# Patient Record
Sex: Female | Born: 1990
Health system: Southern US, Community
[De-identification: ages and names within clinical notes are randomized; demographics above are authoritative.]

## PROBLEM LIST (undated history)

## (undated) ENCOUNTER — Inpatient Hospital Stay: Payer: Self-pay

## (undated) DIAGNOSIS — Z3A33 33 weeks gestation of pregnancy: Secondary | ICD-10-CM

## (undated) DIAGNOSIS — F419 Anxiety disorder, unspecified: Secondary | ICD-10-CM

## (undated) DIAGNOSIS — I471 Supraventricular tachycardia, unspecified: Secondary | ICD-10-CM

## (undated) DIAGNOSIS — E282 Polycystic ovarian syndrome: Secondary | ICD-10-CM

## (undated) HISTORY — DX: Anxiety disorder, unspecified: F41.9

## (undated) HISTORY — PX: NO PAST SURGERIES: SHX2092

## (undated) HISTORY — DX: Supraventricular tachycardia, unspecified: I47.10

## (undated) HISTORY — DX: 33 weeks gestation of pregnancy: Z3A.33

## (undated) HISTORY — DX: Supraventricular tachycardia: I47.1

---

## 2014-05-24 ENCOUNTER — Other Ambulatory Visit
Admit: 2014-05-24 | Disposition: A | Discharge status: Home / Self Care | Payer: Self-pay | Attending: Obstetrics & Gynecology | Admitting: Obstetrics & Gynecology

## 2014-05-24 LAB — TSH: Thyroid Stimulating Horm: 2.409 u[IU]/mL

## 2015-02-04 NOTE — L&D Delivery Note (Signed)
Delivery Note At 6:10 AM a viable female was delivered via Vaginal, Spontaneous Delivery (Presentation: Right Occiput Anterior).  APGAR: 7,9 ; weight 7 lb 5.5 oz (3330 g).   Placenta status: Intact, Spontaneous.  Cord: 3 vessels with the following complications: none apparent .  Cord pH: not sent  Anesthesia: Epidural  Episiotomy: None Lacerations: 1st degree Suture Repair: 3.0 vicryl Est. Blood Loss (mL): 200  Mom to postpartum.  Baby to Couplet care / Skin to Skin.  Mom presented in labor.  AROM'd for clear fluid and pitocin started for augmentation.  Patient received epidural, progressed to complete, pushed for ~20 minutes, with terminal bradycardia (60s) while baby crowning. Upon delivery, peds assessed baby at perineum, cord was doubly clamped and cut by FOB, and baby was taken to the warmer, and became vigorous there prior to one minute.  A section of cord was obtained for blood gas, but peds declined study.  There was a laceration on the fetal scalp present upon beginning of pushing - unsure of its etiology.  Reassessed after delivery and still present - hemostatic.  A shallow tear in the posterior fourchette that was bleeding was repaired with 2x figure of eights; no perineal tear.  Baby was recovered and placed on mom's chest for skin to skin, and we sang happy birthday to [facility].   Chelsea C Ward 06/29/2015, 6:41 AM

## 2015-02-15 LAB — OB RESULTS CONSOLE HEPATITIS B SURFACE ANTIGEN: HEP B S AG: NEGATIVE

## 2015-02-15 LAB — OB RESULTS CONSOLE RUBELLA ANTIBODY, IGM: RUBELLA: IMMUNE

## 2015-02-15 LAB — OB RESULTS CONSOLE ABO/RH: RH TYPE: POSITIVE

## 2015-02-15 LAB — OB RESULTS CONSOLE RPR: RPR: NONREACTIVE

## 2015-02-15 LAB — OB RESULTS CONSOLE GC/CHLAMYDIA
Chlamydia: NEGATIVE
Gonorrhea: NEGATIVE

## 2015-02-15 LAB — OB RESULTS CONSOLE VARICELLA ZOSTER ANTIBODY, IGG: Varicella: NON-IMMUNE/NOT IMMUNE

## 2015-02-15 LAB — OB RESULTS CONSOLE HIV ANTIBODY (ROUTINE TESTING): HIV: NONREACTIVE

## 2015-02-15 LAB — OB RESULTS CONSOLE PLATELET COUNT: Platelets: 268 10*3/uL

## 2015-04-09 ENCOUNTER — Ambulatory Visit
Admission: RE | Admit: 2015-04-09 | Discharge: 2015-04-09 | Disposition: A | Discharge status: Home / Self Care | Payer: Managed Care, Other (non HMO) | Source: Ambulatory Visit | Attending: Maternal & Fetal Medicine | Admitting: Maternal & Fetal Medicine

## 2015-04-09 VITALS — BP 115/67 | HR 107 | Temp 98.1°F | Resp 18 | Ht 67.0 in | Wt 149.0 lb

## 2015-04-09 DIAGNOSIS — E282 Polycystic ovarian syndrome: Secondary | ICD-10-CM | POA: Diagnosis not present

## 2015-04-09 HISTORY — DX: Polycystic ovarian syndrome: E28.2

## 2015-04-09 NOTE — Progress Notes (Signed)
MFM Consult Note  Reason for consultation: hyperandrogenism in setting of PCOS  25 yo G1 wth LMP 10/05/14 and EDD 07/12/15 c/w [place] at [place] on 02/12/15; measurements were consistent with 9 weeks 6 days.  She is at 26 weeks 5 days and referred secondary to history of PCOS and elevated testosterone levels.  She reports testosterone level testing due to lack of menses x 3 months.  She responded to a progesterone challenge with withdrawal bleeding.  She reports a history of menstrual irregularities since menarche (age 12) and was on OCPS until recently.  She is not overweight and occasionally tweezes facial hairs and reports a history of acne.   Her elevated testosterone levels prompted an endocrinology referral (Dr. Lavone Orn, [place]) and evaluation.  At the time of that consultation (11/2014) she was noted to have normal TSH, hi LH: FSH ratio, normal DHEA-S, nl prolactin and elevated androstenedione (482ng/dl).  Her testosterone level was 127 ng/dL (range 10-55).   No ovarian masses were seen at the time of her first trimester ultrasound.  She is scheduled to have a follow up Endocrinology appointment.    Past Medical History  Diagnosis Date  . PCOS (polycystic ovarian syndrome)    Obgyn Menses onset: age 14, she took OCPs to correct menstrual irregularities.   Her last pap(during pregnancy)  demonstrated ASCUS (pt aware she will need repeat in 1 year)   Past Surgical History  Procedure Laterality Date  . No past surgeries     No Known Allergies  No current outpatient prescriptions on file prior to encounter.   No current facility-administered medications on file prior to encounter.   Family history Niece born with absent phalange, no other family history of birth defects, pgm T2 DM, Sister T2 DM  Social History Here with husband [name]) she denies tobacco, etoh, ilicit drug use.  Homemaker.  [name] smokes 1/2 ppd but plans to quit after baby's born.  He works at [employer]    Labs  (1/17) Varicella nonimmune (pt aware) HIV neg RPR NR Hepatitis B Neg Blood type A positive Hct 32% plt 268K Gc/chlam neg  AFP Tetra T21 risk: 1 in 10,000 T18 risk:  Not increased (1 in 69 by age) MsAFP 1.24 MoM  Impression/Plan We addressed concerns related to PCOs during pregnancy including the increased risk for gestational diabetes, preeclampsia and fetal growth disturbances (elevated testosterone levels have been associated with IUGR)  We also addressed the fact that her levels of testosterone are elevated, but in the context of PCOs, not associated with and increase in fetal virilization.   I recommended the following: 1. Baseline labs due to her increased risk for preeclampsia (CBC---normal), liver function testing, p:c ratio 2. Follow up fetal growth assessment in the third trimester--please schedule where convenient 3. She is scheduled for her glucose screening next week---if she develops gestational diabetes, we would be happy to see her back in consultation.  I advised her that restarting metformin is a possible option if gestational diabetes develops and she needs therapy.    Time spent in consultation 45 minutes

## 2015-05-29 ENCOUNTER — Observation Stay
Admission: EM | Admit: 2015-05-29 | Discharge: 2015-05-29 | Disposition: A | Discharge status: Home / Self Care | Payer: Managed Care, Other (non HMO) | Attending: Obstetrics and Gynecology | Admitting: Obstetrics and Gynecology

## 2015-05-29 DIAGNOSIS — Z3A34 34 weeks gestation of pregnancy: Secondary | ICD-10-CM | POA: Insufficient documentation

## 2015-05-29 DIAGNOSIS — O479 False labor, unspecified: Secondary | ICD-10-CM | POA: Diagnosis present

## 2015-05-29 DIAGNOSIS — O4703 False labor before 37 completed weeks of gestation, third trimester: Secondary | ICD-10-CM | POA: Diagnosis present

## 2015-05-29 MED ORDER — CALCIUM CARBONATE ANTACID 500 MG PO CHEW: 2.0000 | CHEWABLE_TABLET | ORAL | Status: DC | PRN

## 2015-05-29 NOTE — Final Progress Note (Signed)
Physician Final Progress Note  Patient ID: Valerie Gardner MRN: [id number] DOB/AGE: Jun 29, 1990 25 y.o.  Admit date: 05/29/2015 Admitting provider: Malachy Mood, MD Discharge date: 05/29/2015   Admission Diagnoses: Irregular contractions  Discharge Diagnoses:  Active Problems:   Irregular contractions Braxton Hicks contractions  Consults: None  Significant Findings/ Diagnostic Studies: none  Procedures: NST reactive FHT 150, moderate, positive accels, no decels  Discharge Condition: good  Disposition: Final discharge disposition not confirmed  Diet: Regular diet  Discharge Activity: Activity as tolerated  Discharge Instructions    Discharge diet:  No restrictions    Complete by:  As directed      Fetal Kick Count:  Lie on our left side for one hour after a meal, and count the number of times your baby kicks.  If it is less than 5 times, get up, move around and drink some juice.  Repeat the test 30 minutes later.  If it is still less than 5 kicks in an hour, notify your doctor.    Complete by:  As directed      LABOR:  When conractions begin, you should start to time them from the beginning of one contraction to the beginning  of the next.  When contractions are 5 - 10 minutes apart or less and have been regular for at least an hour, you should call your health care provider.    Complete by:  As directed      No sexual activity restrictions    Complete by:  As directed      Notify physician for bleeding from the vagina    Complete by:  As directed      Notify physician for blurring of vision or spots before the eyes    Complete by:  As directed      Notify physician for chills or fever    Complete by:  As directed      Notify physician for fainting spells, "black outs" or loss of consciousness    Complete by:  As directed      Notify physician for increase in vaginal discharge    Complete by:  As directed      Notify physician for leaking of fluid    Complete  by:  As directed      Notify physician for pain or burning when urinating    Complete by:  As directed      Notify physician for pelvic pressure (sudden increase)    Complete by:  As directed      Notify physician for severe or continued nausea or vomiting    Complete by:  As directed      Notify physician for sudden gushing of fluid from the vagina (with or without continued leaking)    Complete by:  As directed      Notify physician for sudden, constant, or occasional abdominal pain    Complete by:  As directed      Notify physician if baby moving less than usual    Complete by:  As directed             Medication List    Notice    You have not been prescribed any medications.       Total time spent taking care of this patient: 15 minutes  Signed: Dorthula Nettles 05/29/2015, 11:21 PM

## 2015-05-29 NOTE — Plan of Care (Signed)
Pt presents to l/d with c/o contractions that started approximately 1 hour ago. Pt's mom states she has had 3 in the last hour.

## 2015-06-21 LAB — OB RESULTS CONSOLE GBS: GBS: NEGATIVE

## 2015-06-28 ENCOUNTER — Inpatient Hospital Stay: Payer: Managed Care, Other (non HMO) | Admitting: Anesthesiology

## 2015-06-28 ENCOUNTER — Encounter: Payer: Self-pay | Admitting: *Deleted

## 2015-06-28 ENCOUNTER — Inpatient Hospital Stay
Admission: EM | Admit: 2015-06-28 | Discharge: 2015-07-01 | DRG: 775 | Disposition: A | Discharge status: Home / Self Care | Payer: Managed Care, Other (non HMO) | Attending: Obstetrics & Gynecology | Admitting: Obstetrics & Gynecology

## 2015-06-28 DIAGNOSIS — O99013 Anemia complicating pregnancy, third trimester: Secondary | ICD-10-CM | POA: Diagnosis present

## 2015-06-28 DIAGNOSIS — Z87891 Personal history of nicotine dependence: Secondary | ICD-10-CM

## 2015-06-28 DIAGNOSIS — D62 Acute posthemorrhagic anemia: Secondary | ICD-10-CM | POA: Diagnosis not present

## 2015-06-28 DIAGNOSIS — Z3A38 38 weeks gestation of pregnancy: Secondary | ICD-10-CM | POA: Diagnosis not present

## 2015-06-28 DIAGNOSIS — Z3403 Encounter for supervision of normal first pregnancy, third trimester: Secondary | ICD-10-CM | POA: Diagnosis present

## 2015-06-28 DIAGNOSIS — E281 Androgen excess: Secondary | ICD-10-CM | POA: Diagnosis present

## 2015-06-28 LAB — ABO/RH: ABO/RH(D): A POS

## 2015-06-28 LAB — TYPE AND SCREEN
ABO/RH(D): A POS
ANTIBODY SCREEN: NEGATIVE

## 2015-06-28 LAB — CBC
HEMATOCRIT: 29.3 % — AB (ref 35.0–47.0)
Hemoglobin: 9.5 g/dL — ABNORMAL LOW (ref 12.0–16.0)
MCH: 26.1 pg (ref 26.0–34.0)
MCHC: 32.3 g/dL (ref 32.0–36.0)
MCV: 80.9 fL (ref 80.0–100.0)
Platelets: 151 10*3/uL (ref 150–440)
RBC: 3.63 MIL/uL — ABNORMAL LOW (ref 3.80–5.20)
RDW: 17.1 % — AB (ref 11.5–14.5)
WBC: 10.1 10*3/uL (ref 3.6–11.0)

## 2015-06-28 MED ORDER — OXYTOCIN 10 UNIT/ML IJ SOLN
INTRAMUSCULAR | Status: AC
  Filled 2015-06-28: qty 2

## 2015-06-28 MED ORDER — LACTATED RINGERS IV SOLN: 500.0000 mL | INTRAVENOUS | Status: DC | PRN

## 2015-06-28 MED ORDER — ACETAMINOPHEN 500 MG PO TABS: 1000.0000 mg | ORAL_TABLET | ORAL | Status: DC | PRN

## 2015-06-28 MED ORDER — LIDOCAINE-EPINEPHRINE (PF) 1.5 %-1:200000 IJ SOLN
INTRAMUSCULAR | Status: DC | PRN
  Administered 2015-06-28: 3 mL via PERINEURAL

## 2015-06-28 MED ORDER — AMMONIA AROMATIC IN INHA
RESPIRATORY_TRACT | Status: AC
  Filled 2015-06-28: qty 10

## 2015-06-28 MED ORDER — FENTANYL 2.5 MCG/ML W/ROPIVACAINE 0.2% IN NS 100 ML EPIDURAL INFUSION (ARMC-ANES)
EPIDURAL | Status: AC
  Filled 2015-06-28: qty 100

## 2015-06-28 MED ORDER — BUTORPHANOL TARTRATE 1 MG/ML IJ SOLN
INTRAMUSCULAR | Status: AC
  Administered 2015-06-28: 1 mg via INTRAVENOUS
  Filled 2015-06-28: qty 1

## 2015-06-28 MED ORDER — LACTATED RINGERS IV SOLN
INTRAVENOUS | Status: DC
  Administered 2015-06-28 (×2): via INTRAVENOUS

## 2015-06-28 MED ORDER — SOD CITRATE-CITRIC ACID 500-334 MG/5ML PO SOLN: 30.0000 mL | ORAL | Status: DC | PRN

## 2015-06-28 MED ORDER — LIDOCAINE HCL (PF) 1 % IJ SOLN
INTRAMUSCULAR | Status: AC
  Filled 2015-06-28: qty 30

## 2015-06-28 MED ORDER — MISOPROSTOL 200 MCG PO TABS
ORAL_TABLET | ORAL | Status: AC
  Filled 2015-06-28: qty 4

## 2015-06-28 MED ORDER — BUTORPHANOL TARTRATE 1 MG/ML IJ SOLN
1.0000 mg | Freq: Once | INTRAMUSCULAR | Status: AC
  Administered 2015-06-28: 1 mg via INTRAVENOUS

## 2015-06-28 MED ORDER — OXYTOCIN BOLUS FROM INFUSION
500.0000 mL | INTRAVENOUS | Status: DC
  Administered 2015-06-29: 500 mL via INTRAVENOUS

## 2015-06-28 MED ORDER — OXYTOCIN 40 UNITS IN LACTATED RINGERS INFUSION - SIMPLE MED
2.5000 [IU]/h | INTRAVENOUS | Status: DC
  Administered 2015-06-29: 2.5 [IU]/h via INTRAVENOUS
  Filled 2015-06-28: qty 1000

## 2015-06-28 MED ORDER — ONDANSETRON HCL 4 MG/2ML IJ SOLN
4.0000 mg | Freq: Four times a day (QID) | INTRAMUSCULAR | Status: DC | PRN
  Administered 2015-06-28 – 2015-06-29 (×2): 4 mg via INTRAVENOUS
  Filled 2015-06-28 (×2): qty 2

## 2015-06-28 MED ORDER — LIDOCAINE HCL (PF) 1 % IJ SOLN: 30.0000 mL | INTRAMUSCULAR | Status: DC | PRN

## 2015-06-28 NOTE — OB Triage Note (Signed)
CTX every 5 mins apart.  Was seen in office today and was 2 cm/ 50%.  Now 3.5cm/80-90%.  No bloody show.

## 2015-06-28 NOTE — Progress Notes (Signed)
Intrapartum progress note  S:  Patient comfortable but contractions getting increasingly painful.  O: 131/71 73 18 98.2   FHT: 135 mod +accels no decels TOCO: q2-31min SVE: 5/90/-1 AROM'd for mod clear fluid  A/P:  24yo G1P0 @ 38.0 with Labor  1. Continue expectant management after AROM, if contractions space out then pitocin PRN 2. IUP: category 1 strip 3. Epidural requested, will notify anesthesia.  ----- Larey Days, MD Attending Obstetrician and Gynecologist [facility]

## 2015-06-28 NOTE — Anesthesia Procedure Notes (Signed)
Epidural Patient location during procedure: OB Start time: 06/28/2015 10:45 PM End time: 06/28/2015 11:09 PM  Staffing Performed by: anesthesiologist   Preanesthetic Checklist Completed: patient identified, site marked, surgical consent, pre-op evaluation, timeout performed, IV checked, risks and benefits discussed and monitors and equipment checked  Epidural Patient position: sitting Prep: Betadine Patient monitoring: heart rate, continuous pulse ox and blood pressure Approach: midline Location: L4-L5 Injection technique: LOR saline  Needle:  Needle type: Tuohy  Needle gauge: 17 G Needle length: 9 cm and 9 Catheter type: closed end flexible Catheter size: 20 Guage Catheter at skin depth: 9 cm Test dose: negative and 1.5% lidocaine with Epi 1:200 K  Assessment Events: blood not aspirated, injection not painful, no injection resistance, negative IV test and no paresthesia  Additional Notes   Patient tolerated the insertion well without complications.Reason for block:procedure for pain

## 2015-06-28 NOTE — Anesthesia Preprocedure Evaluation (Signed)
Anesthesia Evaluation  Patient identified by MRN, date of birth, ID band Patient awake    Reviewed: Allergy & Precautions, NPO status , Patient's Chart, lab work & pertinent test results  History of Anesthesia Complications Negative for: history of anesthetic complications  Airway Mallampati: II       Dental   Pulmonary former smoker,           Cardiovascular negative cardio ROS       Neuro/Psych negative neurological ROS  negative psych ROS   GI/Hepatic negative GI ROS, Neg liver ROS,   Endo/Other  negative endocrine ROS  Renal/GU negative Renal ROS  negative genitourinary   Musculoskeletal   Abdominal   Peds negative pediatric ROS (+)  Hematology negative hematology ROS (+)   Anesthesia Other Findings   Reproductive/Obstetrics (+) Pregnancy                             Anesthesia Physical Anesthesia Plan  ASA: II  Anesthesia Plan: Epidural   Post-op Pain Management:    Induction:   Airway Management Planned:   Additional Equipment:   Intra-op Plan:   Post-operative Plan:   Informed Consent: I have reviewed the patients History and Physical, chart, labs and discussed the procedure including the risks, benefits and alternatives for the proposed anesthesia with the patient or authorized representative who has indicated his/her understanding and acceptance.     Plan Discussed with:   Anesthesia Plan Comments:         Anesthesia Quick Evaluation

## 2015-06-28 NOTE — H&P (Signed)
OB History & Physical   History of Present Illness:  Chief Complaint: contractions  HPI:  Valerie Gardner is a 25 y.o. G1P0 female at [redacted]w[redacted]d dated by LMP c/w 6w [place] with EDC of 07/12/15.  She presents to L&D for persistent contractions that are becoming increasingly painful.  +FM, + CTX, no LOF, no VB  Pregnancy Issues: 1. Hyperandrogenemia;PCOS 2. Anemia  Maternal Medical History:   Past Medical History  Diagnosis Date  . PCOS (polycystic ovarian syndrome)     Past Surgical History  Procedure Laterality Date  . No past surgeries      No Known Allergies  Prior to Admission medications   Not on File     Prenatal care site: [facility]   Social History: She  reports that she has quit smoking. She has never used smokeless tobacco. She reports that she does not drink alcohol or use illicit drugs.  Family History: family history is not on file.   Review of Systems: Negative x 10 systems reviewed except as noted in the HPI.     Physical Exam:  Vital Signs: BP 137/73 mmHg  Pulse 67  Temp(Src) 97.9 F (36.6 C) (Oral)  Resp 16  LMP 10/05/2014 General: no acute distress.  HEENT: normocephalic, atraumatic Heart: regular rate & rhythm.  No murmurs/rubs/gallops Lungs: clear to auscultation bilaterally, normal respiratory effort Abdomen: soft, gravid, non-tender;  EFW: 8.6 Pelvic:   External: Normal external female genitalia  Cervix: Dilation: 3 / Effacement (%): 90 / Station: -2    Extremities: non-tender, symmetric, 1+ edema bilaterally.  DTRs: 2+ Neurologic: Alert & oriented x 3.    No results found for this or any previous visit (from the past 24 hour(s)).  Pertinent Results:  Prenatal Labs: Blood type/Rh A+  Antibody screen neg  Rubella Immune  Varicella Non-Immune  RPR NR  HBsAg Neg  HIV NR  GC neg  Chlamydia neg  Genetic screening declined  1 hour GTT 94  3 hour GTT --  GBS negative   FHT: 135 mod + accels no decels TOCO: q2-34min  SVE:  0000000   Cephalic by leopolds  Assessment:  Valerie Gardner is a 25 y.o. G1P0 female at [redacted]w[redacted]d with labor.   Plan:  1. Admit to Labor & Delivery 2. CBC, T&S, Clrs, IVF 3. GBS  neg 4. Consents obtained. 5. Continuous efm/toco 6. Expectant management; patient may ambulate PRN, epidural PRN.  Will augment PRN.  ----- Larey Days, MD Attending Obstetrician and Gynecologist [facility]

## 2015-06-29 ENCOUNTER — Encounter: Payer: Self-pay | Admitting: Obstetrics & Gynecology

## 2015-06-29 DIAGNOSIS — O99013 Anemia complicating pregnancy, third trimester: Secondary | ICD-10-CM | POA: Diagnosis present

## 2015-06-29 DIAGNOSIS — E281 Androgen excess: Secondary | ICD-10-CM | POA: Diagnosis present

## 2015-06-29 LAB — RPR: RPR: NONREACTIVE

## 2015-06-29 MED ORDER — COCONUT OIL OIL
1.0000 "application " | TOPICAL_OIL | Status: DC | PRN
  Filled 2015-06-29: qty 120

## 2015-06-29 MED ORDER — DIPHENHYDRAMINE HCL 50 MG/ML IJ SOLN: 12.5000 mg | INTRAMUSCULAR | Status: DC | PRN

## 2015-06-29 MED ORDER — DOCUSATE SODIUM 100 MG PO CAPS
100.0000 mg | ORAL_CAPSULE | Freq: Two times a day (BID) | ORAL | Status: DC
  Administered 2015-06-29 – 2015-07-01 (×4): 100 mg via ORAL
  Filled 2015-06-29 (×4): qty 1

## 2015-06-29 MED ORDER — LACTATED RINGERS IV SOLN: 500.0000 mL | Freq: Once | INTRAVENOUS | Status: DC

## 2015-06-29 MED ORDER — BENZOCAINE-MENTHOL 20-0.5 % EX AERO: 1.0000 "application " | INHALATION_SPRAY | CUTANEOUS | Status: DC | PRN

## 2015-06-29 MED ORDER — EPHEDRINE 5 MG/ML INJ
10.0000 mg | INTRAVENOUS | Status: DC | PRN
  Filled 2015-06-29: qty 2

## 2015-06-29 MED ORDER — SIMETHICONE 80 MG PO CHEW: 80.0000 mg | CHEWABLE_TABLET | ORAL | Status: DC | PRN

## 2015-06-29 MED ORDER — ONDANSETRON HCL 4 MG/2ML IJ SOLN: 4.0000 mg | INTRAMUSCULAR | Status: DC | PRN

## 2015-06-29 MED ORDER — PHENYLEPHRINE 40 MCG/ML (10ML) SYRINGE FOR IV PUSH (FOR BLOOD PRESSURE SUPPORT)
80.0000 ug | PREFILLED_SYRINGE | INTRAVENOUS | Status: DC | PRN
  Filled 2015-06-29: qty 5

## 2015-06-29 MED ORDER — WITCH HAZEL-GLYCERIN EX PADS: 1.0000 "application " | MEDICATED_PAD | CUTANEOUS | Status: DC | PRN

## 2015-06-29 MED ORDER — IBUPROFEN 600 MG PO TABS
600.0000 mg | ORAL_TABLET | Freq: Four times a day (QID) | ORAL | Status: DC
  Administered 2015-06-29 – 2015-07-01 (×9): 600 mg via ORAL
  Filled 2015-06-29 (×9): qty 1

## 2015-06-29 MED ORDER — ACETAMINOPHEN 500 MG PO TABS: 1000.0000 mg | ORAL_TABLET | Freq: Four times a day (QID) | ORAL | Status: DC | PRN

## 2015-06-29 MED ORDER — PRENATAL MULTIVITAMIN CH
1.0000 | ORAL_TABLET | Freq: Every day | ORAL | Status: DC
  Administered 2015-06-30 – 2015-07-01 (×2): 1 via ORAL
  Filled 2015-06-29 (×3): qty 1

## 2015-06-29 MED ORDER — DIPHENHYDRAMINE HCL 25 MG PO CAPS: 25.0000 mg | ORAL_CAPSULE | Freq: Four times a day (QID) | ORAL | Status: DC | PRN

## 2015-06-29 MED ORDER — DIBUCAINE 1 % RE OINT: 1.0000 "application " | TOPICAL_OINTMENT | RECTAL | Status: DC | PRN

## 2015-06-29 MED ORDER — OXYTOCIN 40 UNITS IN LACTATED RINGERS INFUSION - SIMPLE MED
1.0000 m[IU]/min | INTRAVENOUS | Status: DC
  Administered 2015-06-29: 1 m[IU]/min via INTRAVENOUS

## 2015-06-29 MED ORDER — FENTANYL 2.5 MCG/ML W/ROPIVACAINE 0.2% IN NS 100 ML EPIDURAL INFUSION (ARMC-ANES): 10.0000 mL/h | EPIDURAL | Status: DC

## 2015-06-29 MED ORDER — ONDANSETRON HCL 4 MG PO TABS: 4.0000 mg | ORAL_TABLET | ORAL | Status: DC | PRN

## 2015-06-29 NOTE — Discharge Summary (Signed)
Obstetrical Discharge Summary  Patient Name: Valerie Gardner DOB: 16-Dec-1990 MRN: [id number]  Date of Admission: 06/28/2015 Date of Discharge: 07/01/15  Primary OB: [facility]   Gestational Age at Delivery: [redacted]w[redacted]d   Antepartum complications: Hyperandrogenemia, Anemia Admitting Diagnosis: Labor  Secondary Diagnosis: Patient Active Problem List   Diagnosis Date Noted  . Labor and delivery, indication for care 06/28/2015  . Irregular contractions 05/29/2015    Augmentation: AROM and Pitocin Complications: None Intrapartum complications/course: Patient admitted in labor, AROM'd for clear fluid, pitocin started, and progressed to complete.  Short second stage.  Delivery of female fetus, terminal bradycardia, not vigorous at birth so cord cut quickly and assessed at warmer by peds with no interventions.  Once at the warmer baby became vigorous.  Shallow first degree laceration, repaired with two figures of eight of 3-0 vicryl.   Date of Delivery: 06/29/15 Delivered By: Vikki Ports Ward Delivery Type: spontaneous vaginal delivery Anesthesia: epidural Placenta: sponatneous Laceration: 1st degree Episiotomy: none Newborn Data: Live born female Birth Weight:   APGAR: 7, 9    Discharge Physical Exam:  Filed Vitals:   06/30/15 1631 06/30/15 2004 07/01/15 0035 07/01/15 0926  BP:  108/50 117/60 117/62  Pulse:  63 74 67  Temp: 98 F (36.7 C) 98 F (36.7 C) 98 F (36.7 C) 98.4 F (36.9 C)  TempSrc: Oral Oral Oral Oral  Resp:  20 20 18   Height:      Weight:      SpO2:  99% 99% 100%    General: NAD CV: RRR Pulm: CTABL, nl effort ABD: s/nd/nt, fundus firm and below the umbilicus Lochia: moderate DVT Evaluation: LE non-ttp, no evidence of DVT on exam.  Recent Results (from the past 2160 hour(s))  OB RESULT CONSOLE Group B Strep     Status: None   Collection Time: 06/21/15 12:00 AM  Result Value Ref Range   GBS Negative   CBC     Status: Abnormal   Collection Time:  06/28/15  5:09 PM  Result Value Ref Range   WBC 10.1 3.6 - 11.0 K/uL   RBC 3.63 (L) 3.80 - 5.20 MIL/uL   Hemoglobin 9.5 (L) 12.0 - 16.0 g/dL   HCT 29.3 (L) 35.0 - 47.0 %   MCV 80.9 80.0 - 100.0 fL   MCH 26.1 26.0 - 34.0 pg   MCHC 32.3 32.0 - 36.0 g/dL   RDW 17.1 (H) 11.5 - 14.5 %   Platelets 151 150 - 440 K/uL  Type and screen [facility]     Status: None   Collection Time: 06/28/15  5:09 PM  Result Value Ref Range   ABO/RH(D) A POS    Antibody Screen NEG    Sample Expiration 07/01/2015   RPR     Status: None   Collection Time: 06/28/15  5:09 PM  Result Value Ref Range   RPR Ser Ql Non Reactive Non Reactive    Comment: (NOTE) Performed At: [facility] [place] [employer], [place] [id number] Lindon Romp MD [id number]   ABO/Rh     Status: None   Collection Time: 06/28/15  5:10 PM  Result Value Ref Range   ABO/RH(D) A POS   CBC     Status: Abnormal   Collection Time: 06/30/15  6:51 AM  Result Value Ref Range   WBC 13.2 (H) 3.6 - 11.0 K/uL   RBC 3.19 (L) 3.80 - 5.20 MIL/uL   Hemoglobin 8.1 (L) 12.0 - 16.0 g/dL  HCT 25.4 (L) 35.0 - 47.0 %   MCV 79.7 (L) 80.0 - 100.0 fL   MCH 25.4 (L) 26.0 - 34.0 pg   MCHC 31.9 (L) 32.0 - 36.0 g/dL   RDW 17.1 (H) 11.5 - 14.5 %   Platelets 142 (L) 150 - 440 K/uL    Post partum course: Unremarkable Postpartum Procedures: None Disposition: stable, discharge to home. Baby Feeding: bottle Baby Disposition: home with mom  Rh Immune globulin given: n/a Rubella vaccine given: n/a Tdap vaccine given in AP or PP setting: AP Flu vaccine given in AP or PP setting: n/a  Contraception: condoms  Prenatal Labs:  Blood type/Rh A+  Antibody screen neg  Rubella Immune  Varicella Non-Immune  RPR NR  HBsAg Neg  HIV NR  GC neg  Chlamydia neg  Genetic screening declined  1 hour GTT 94  3 hour GTT --  GBS negative          Plan:  Valerie Gardner was discharged to  home in good condition. Follow-up appointment at [place] with Dr Leonides Schanz in 6 weeks    Discharge Medications:   Medication List    TAKE these medications        ferrous gluconate 324 MG tablet  Commonly known as:  FERGON  Take 1 tablet (324 mg total) by mouth daily.     ibuprofen 600 MG tablet  Commonly known as:  ADVIL,MOTRIN  Take 1 tablet (600 mg total) by mouth every 6 (six) hours.         Sharlyn Bologna, CNM

## 2015-06-30 LAB — CBC
HCT: 25.4 % — ABNORMAL LOW (ref 35.0–47.0)
Hemoglobin: 8.1 g/dL — ABNORMAL LOW (ref 12.0–16.0)
MCH: 25.4 pg — ABNORMAL LOW (ref 26.0–34.0)
MCHC: 31.9 g/dL — AB (ref 32.0–36.0)
MCV: 79.7 fL — ABNORMAL LOW (ref 80.0–100.0)
Platelets: 142 10*3/uL — ABNORMAL LOW (ref 150–440)
RBC: 3.19 MIL/uL — ABNORMAL LOW (ref 3.80–5.20)
RDW: 17.1 % — AB (ref 11.5–14.5)
WBC: 13.2 10*3/uL — ABNORMAL HIGH (ref 3.6–11.0)

## 2015-06-30 NOTE — Anesthesia Postprocedure Evaluation (Signed)
Anesthesia Post Note  Patient: Valerie Gardner  Procedure(s) Performed: * No procedures listed *  Patient location during evaluation: Mother Baby Anesthesia Type: Epidural Level of consciousness: awake and alert Pain management: pain level controlled Vital Signs Assessment: post-procedure vital signs reviewed and stable Respiratory status: spontaneous breathing, nonlabored ventilation and respiratory function stable Cardiovascular status: stable Postop Assessment: no headache, no backache and epidural receding Anesthetic complications: no    Last Vitals:  Filed Vitals:   06/30/15 0306 06/30/15 0746  BP: 102/45 111/54  Pulse: 65 73  Temp: 36.8 C 36.8 C  Resp: 20 18    Last Pain:  Filed Vitals:   06/30/15 0746  PainSc: 0-No pain                 Precious Haws Kayshawn Ozburn

## 2015-06-30 NOTE — Anesthesia Post-op Follow-up Note (Signed)
  Anesthesia Pain Follow-up Note  Patient: Valerie Gardner  Day #: 1  Date of Follow-up: 06/30/2015 Time: 8:35 AM  Last Vitals:  Filed Vitals:   06/30/15 0306 06/30/15 0746  BP: 102/45 111/54  Pulse: 65 73  Temp: 36.8 C 36.8 C  Resp: 20 18    Level of Consciousness: alert  Pain: mild   Side Effects:None  Catheter Site Exam:clean  Plan: D/C from anesthesia care  [place]

## 2015-06-30 NOTE — Progress Notes (Signed)
  Subjective:  Doing well, moderate lochia.  No fevers, no chills  Objective:   Blood pressure 111/54, pulse 73, temperature 98.2 F (36.8 C), temperature source Oral, resp. rate 18, height 5\' 7"  (1.702 m), weight 75.297 kg (166 lb), last menstrual period 10/05/2014, SpO2 98 %, unknown if currently breastfeeding.  General: NAD Pulmonary: no increased work of breathing Abdomen: non-distended, non-tender, fundus firm at level of umbilicus Extremities: no edema, no erythema, no tenderness  Results for orders placed or performed during the hospital encounter of 06/28/15 (from the past 72 hour(s))  CBC     Status: Abnormal   Collection Time: 06/28/15  5:09 PM  Result Value Ref Range   WBC 10.1 3.6 - 11.0 K/uL   RBC 3.63 (L) 3.80 - 5.20 MIL/uL   Hemoglobin 9.5 (L) 12.0 - 16.0 g/dL   HCT 29.3 (L) 35.0 - 47.0 %   MCV 80.9 80.0 - 100.0 fL   MCH 26.1 26.0 - 34.0 pg   MCHC 32.3 32.0 - 36.0 g/dL   RDW 17.1 (H) 11.5 - 14.5 %   Platelets 151 150 - 440 K/uL  Type and screen [facility]     Status: None   Collection Time: 06/28/15  5:09 PM  Result Value Ref Range   ABO/RH(D) A POS    Antibody Screen NEG    Sample Expiration 07/01/2015   RPR     Status: None   Collection Time: 06/28/15  5:09 PM  Result Value Ref Range   RPR Ser Ql Non Reactive Non Reactive    Comment: (NOTE) Performed At: [facility] [place], [place] [id number] Lindon Romp MD [id number]   ABO/Rh     Status: None   Collection Time: 06/28/15  5:10 PM  Result Value Ref Range   ABO/RH(D) A POS   CBC     Status: Abnormal   Collection Time: 06/30/15  6:51 AM  Result Value Ref Range   WBC 13.2 (H) 3.6 - 11.0 K/uL   RBC 3.19 (L) 3.80 - 5.20 MIL/uL   Hemoglobin 8.1 (L) 12.0 - 16.0 g/dL   HCT 25.4 (L) 35.0 - 47.0 %   MCV 79.7 (L) 80.0 - 100.0 fL   MCH 25.4 (L) 26.0 - 34.0 pg   MCHC 31.9 (L) 32.0 - 36.0 g/dL   RDW 17.1 (H) 11.5 - 14.5 %   Platelets 142 (L) 150 - 440  K/uL    Assessment:   25 y.o. G1P1001 postpartum day # 1 TSVD  Plan:    1) Acute blood loss anemia - hemodynamically stable and asymptomatic - po ferrous sulfate  2) --/--/A POS (05/25 1710) Charlynn Grimes Immune (01/12 0000) / Varicella non-immune - Varivax at discharge  3) TDAP status up to date per patient report  4) Bottle/Condoms  5) Disposition anticipated discharge PPD2

## 2015-07-01 MED ORDER — VARICELLA VIRUS VACCINE LIVE 1350 PFU/0.5ML IJ SUSR
0.5000 mL | INTRAMUSCULAR | Status: AC | PRN
  Administered 2015-07-01: 0.5 mL via SUBCUTANEOUS
  Filled 2015-07-01: qty 0.5

## 2015-07-01 MED ORDER — IBUPROFEN 600 MG PO TABS
600.0000 mg | ORAL_TABLET | Freq: Four times a day (QID) | ORAL | Status: DC
Start: 2015-07-01 — End: 2016-10-02

## 2015-07-01 MED ORDER — FERROUS GLUCONATE 324 (38 FE) MG PO TABS: 324.0000 mg | ORAL_TABLET | Freq: Every day | ORAL | Status: DC

## 2015-07-01 NOTE — Progress Notes (Signed)
Pt states that she received TDaP vaccine during pregnancy.  Pt declines TDaP vaccine at this time. Reed Breech, RN 07/01/2015 1:29 PM

## 2015-07-01 NOTE — Progress Notes (Signed)
Discharge instructions provided. Pt and sig other verbalize understanding of all instructions and follow-up care.  Prescriptions given.  Pt discharged to home with infant at 1640 on 07/01/15 via wheelchair by RN. Reed Breech, RN 07/01/2015 7:12 PM

## 2015-07-01 NOTE — Discharge Instructions (Signed)
Discharge instructions:   Call office if you have any of the following: headache, visual changes, fever >101.0 F, chills, breast concerns, excessive vaginal bleeding, incision drainage or problems, leg pain or redness, depression or any other concerns.   Activity: Do not lift > 10 lbs for 6 weeks.  No intercourse or tampons for 6 weeks.  No driving for 1-2 weeks.   Call your doctor for increased pain or vaginal bleeding, temperature above 101.0, depression, or concerns.  No strenuous activity or heavy lifting for 6 weeks.  No intercourse, tampons, douching, or enemas for 6 weeks.  No tub baths-showers only.  No driving for 2 weeks or while taking pain medications.  Continue prenatal vitamin and iron.  Increase calories and fluids while breastfeeding.  You may have a slight fever when your milk comes in, but it should go away on its own.  If it does not, and rises above 101.0 please call the doctor.  For concerns about your baby, please call your pediatrician For breastfeeding concerns, the lactation consultant can be reached at [phone]  Call your doctor for increased pain or vaginal bleeding, temperature above 100.4, depression, or concerns.  No strenuous activity or heavy lifting for 6 weeks.  No intercourse, tampons, douching, or enemas for 6 weeks.  No tub baths-showers only.  No driving for 2 weeks or while taking pain medications.  Continue prenatal vitamin and iron.

## 2016-10-02 ENCOUNTER — Ambulatory Visit (INDEPENDENT_AMBULATORY_CARE_PROVIDER_SITE_OTHER): Payer: Commercial Managed Care - PPO | Admitting: Advanced Practice Midwife

## 2016-10-02 ENCOUNTER — Encounter: Payer: Self-pay | Admitting: Advanced Practice Midwife

## 2016-10-02 VITALS — BP 110/74 | Wt 130.0 lb

## 2016-10-02 DIAGNOSIS — Z113 Encounter for screening for infections with a predominantly sexual mode of transmission: Secondary | ICD-10-CM

## 2016-10-02 DIAGNOSIS — Z124 Encounter for screening for malignant neoplasm of cervix: Secondary | ICD-10-CM

## 2016-10-02 DIAGNOSIS — Z3490 Encounter for supervision of normal pregnancy, unspecified, unspecified trimester: Secondary | ICD-10-CM

## 2016-10-02 NOTE — Progress Notes (Signed)
New Obstetric Patient H&P    Chief Complaint: "Desires prenatal care"   History of Present Illness: Patient is a 26 y.o. G2P1001 Not Hispanic or [place] female, LMP 08/12/2016 presents with amenorrhea and positive home pregnancy test. Based on her LMP, her EDD is Estimated Date of Delivery: 05/19/2017. and her EGA is [redacted]w[redacted]d. Cycles are 6. days, regular, and occur approximately every : 35 days. Her last pap smear was about 10 years ago and was lgsil.    She had a urine pregnancy test which was positive 2 week(s)  ago. Her last menstrual period was normal and lasted for  6 day(s). Since her LMP she claims she has experienced fatigue, nausea. She denies vaginal bleeding. Her past medical history is noncontributory. Her prior pregnancies are notable for none  Since her LMP, she admits to the use of tobacco products  no She claims she has gained   1 pounds since the start of her pregnancy.  There are cats in the home in the home  yes outdoor She admits close contact with children on a regular basis  yes  She has had chicken pox in the past yes She has had Tuberculosis exposures, symptoms, or previously tested positive for TB   no Current or past history of domestic violence. no  Genetic Screening/Teratology Counseling: (Includes patient, baby's father, or anyone in either family with:)   52. Patient's age >/= 30 at [facility]  no 2. Thalassemia ([place], [place], [place], or Asian background): MCV<80  no 3. Neural tube defect (meningomyelocele, spina bifida, anencephaly)  no 4. Congenital heart defect  no  5. Down syndrome  no 6. Tay-Sachs (Jewish, [place])  no 7. Canavan's Disease  no 8. Sickle cell disease or trait (African)  no  9. Hemophilia or other blood disorders  no  10. Muscular dystrophy  no  11. Cystic fibrosis  no  12. Huntington's Chorea  no  13. Mental retardation/autism  no 14. Other inherited genetic or chromosomal disorder  no 15. Maternal metabolic disorder (DM, PKU,  etc)  no 16. Patient or FOB with a child with a birth defect not listed above no  16a. Patient or FOB with a birth defect themselves no 17. Recurrent pregnancy loss, or stillbirth  no  18. Any medications since LMP other than prenatal vitamins (include vitamins, supplements, OTC meds, drugs, alcohol)  no 19. Any other genetic/environmental exposure to discuss  no  Infection History:   1. Lives with someone with TB or TB exposed  no  2. Patient or partner has history of genital herpes  no 3. Rash or viral illness since LMP  no 4. History of STI (GC, CT, HPV, syphilis, HIV)  no 5. History of recent travel :  no  Other pertinent information:  no     Review of Systems:10 point review of systems negative unless otherwise noted in HPI  Past Medical History:  Past Medical History:  Diagnosis Date  . PCOS (polycystic ovarian syndrome)     Past Surgical History:  Past Surgical History:  Procedure Laterality Date  . NO PAST SURGERIES      Gynecologic History: Patient's last menstrual period was 08/12/2016.  Obstetric History: G2P1001  Family History:  Family History  Problem Relation Age of Onset  . Diabetes Mellitus I Sister   . Melanoma Maternal Grandmother   . Diabetes Mellitus II Paternal Grandmother   . COPD Paternal Grandmother     Social History:  Social History   Social History  .  Marital status: Married    Spouse name: N/A  . Number of children: N/A  . Years of education: N/A   Occupational History  . Not on file.   Social History Main Topics  . Smoking status: Former Research scientist (life sciences)  . Smokeless tobacco: Never Used  . Alcohol use No  . Drug use: No  . Sexual activity: Yes    Birth control/ protection: None   Other Topics Concern  . Not on file   Social History Narrative  . No narrative on file    Allergies:  No Known Allergies  Medications: Prior to Admission medications   Medication Sig Start Date End Date Taking? Authorizing Provider  ferrous  gluconate (FERGON) 324 MG tablet Take 1 tablet (324 mg total) by mouth daily. Patient not taking: Reported on 10/02/2016 07/01/15   Burlene Arnt, CNM    Physical Exam Vitals: Blood pressure 110/74, weight 130 lb (59 kg), last menstrual period 08/12/2016, unknown if currently breastfeeding.  General: NAD HEENT: normocephalic, anicteric Thyroid: no enlargement, no palpable nodules Pulmonary: No increased work of breathing, CTAB Cardiovascular: RRR, distal pulses 2+ Abdomen: NABS, soft, non-tender, non-distended.  Umbilicus without lesions.  No hepatomegaly, splenomegaly or masses palpable. No evidence of hernia  Genitourinary:  External: Normal external female genitalia.  Normal urethral meatus, normal  Bartholin's and Skene's glands.    Vagina: Normal vaginal mucosa, no evidence of prolapse.    Cervix: Grossly normal in appearance, no bleeding, no CMT  Uterus: Enlarged, mobile, normal contour.    Adnexa: ovaries non-enlarged, no adnexal masses  Rectal: deferred Extremities: no edema, erythema, or tenderness Neurologic: Grossly intact Psychiatric: mood appropriate, affect full   Assessment: 26 y.o. G2P1001 at Unknown presenting to initiate prenatal care  Plan: 1) Avoid alcoholic beverages. 2) Patient encouraged not to smoke.  3) Discontinue the use of all non-medicinal drugs and chemicals.  4) Take prenatal vitamins daily.  5) Nutrition, food safety (fish, cheese advisories, and high nitrite foods) and exercise discussed. 6) Hospital and practice style discussed with cross coverage system.  7) Genetic Screening, such as with 1st Trimester Screening, cell free fetal DNA, AFP testing, and Ultrasound, as well as with amniocentesis and CVS as appropriate, is discussed with patient. At the conclusion of today's visit patient requested genetic testing 8) Patient is asked about travel to areas at risk for the Zika virus, and counseled to avoid travel and exposure to mosquitoes or sexual  partners who may have themselves been exposed to the virus. Testing is discussed, and will be ordered as appropriate.   Rod Can, CNM

## 2016-10-02 NOTE — Progress Notes (Signed)
NOB today. A lot of nausea

## 2016-10-02 NOTE — Patient Instructions (Signed)

## 2016-10-03 LAB — RPR+RH+ABO+RUB AB+AB SCR+CB...
Antibody Screen: NEGATIVE
HIV Screen 4th Generation wRfx: NONREACTIVE
Hematocrit: 38.9 % (ref 34.0–46.6)
Hemoglobin: 13.1 g/dL (ref 11.1–15.9)
Hepatitis B Surface Ag: NEGATIVE
MCH: 30.3 pg (ref 26.6–33.0)
MCHC: 33.7 g/dL (ref 31.5–35.7)
MCV: 90 fL (ref 79–97)
Platelets: 247 x10E3/uL (ref 150–379)
RBC: 4.32 x10E6/uL (ref 3.77–5.28)
RDW: 13 % (ref 12.3–15.4)
RPR Ser Ql: NONREACTIVE
Rh Factor: POSITIVE
Rubella Antibodies, IGG: 3.52 {index}
Varicella zoster IgG: 387 {index}
WBC: 6.3 x10E3/uL (ref 3.4–10.8)

## 2016-10-04 LAB — URINE CULTURE

## 2016-10-07 LAB — IGP,CTNGTV,RFX APTIMA HPV ASCU
CHLAMYDIA, NUC. ACID AMP: NEGATIVE
GONOCOCCUS, NUC. ACID AMP: NEGATIVE
PAP SMEAR COMMENT: 0
TRICH VAG BY NAA: NEGATIVE

## 2016-10-10 ENCOUNTER — Ambulatory Visit (INDEPENDENT_AMBULATORY_CARE_PROVIDER_SITE_OTHER): Payer: Commercial Managed Care - PPO | Admitting: Advanced Practice Midwife

## 2016-10-10 ENCOUNTER — Ambulatory Visit (INDEPENDENT_AMBULATORY_CARE_PROVIDER_SITE_OTHER): Payer: Commercial Managed Care - PPO

## 2016-10-10 ENCOUNTER — Encounter: Payer: Self-pay | Admitting: Advanced Practice Midwife

## 2016-10-10 VITALS — BP 100/60 | Wt 129.0 lb

## 2016-10-10 DIAGNOSIS — Z3490 Encounter for supervision of normal pregnancy, unspecified, unspecified trimester: Secondary | ICD-10-CM | POA: Diagnosis not present

## 2016-10-10 DIAGNOSIS — Z1379 Encounter for other screening for genetic and chromosomal anomalies: Secondary | ICD-10-CM

## 2016-10-10 DIAGNOSIS — Z3A08 8 weeks gestation of pregnancy: Secondary | ICD-10-CM

## 2016-10-10 NOTE — Progress Notes (Signed)
Dating scan today = LMP, small [place] .6x.6 cm seen on scan. No concerns today. Desires genetic screen, 1st screen in 4 weeks

## 2016-10-14 ENCOUNTER — Telehealth: Payer: Self-pay

## 2016-10-14 MED ORDER — DOXYLAMINE-PYRIDOXINE ER 20-20 MG PO TBCR: 20.0000 mg | EXTENDED_RELEASE_TABLET | Freq: Two times a day (BID) | ORAL | 3 refills | Status: DC

## 2016-10-14 NOTE — Telephone Encounter (Signed)
Pt aware.

## 2016-10-14 NOTE — Telephone Encounter (Signed)
Pt calling requesting a RX for Bonjesta, states she forgot to ask Opal Sidles at last appt. Can you send this in for pt since JEG not here?

## 2016-10-14 NOTE — Telephone Encounter (Signed)
Rx Bonjesta eRxd. RN to notify pt.

## 2016-11-07 ENCOUNTER — Ambulatory Visit (INDEPENDENT_AMBULATORY_CARE_PROVIDER_SITE_OTHER): Payer: Commercial Managed Care - PPO

## 2016-11-07 ENCOUNTER — Ambulatory Visit (INDEPENDENT_AMBULATORY_CARE_PROVIDER_SITE_OTHER): Payer: Commercial Managed Care - PPO | Admitting: Obstetrics & Gynecology

## 2016-11-07 VITALS — BP 110/80 | Wt 130.0 lb

## 2016-11-07 DIAGNOSIS — Z3682 Encounter for antenatal screening for nuchal translucency: Secondary | ICD-10-CM | POA: Diagnosis not present

## 2016-11-07 DIAGNOSIS — Z3A12 12 weeks gestation of pregnancy: Secondary | ICD-10-CM

## 2016-11-07 DIAGNOSIS — Z3490 Encounter for supervision of normal pregnancy, unspecified, unspecified trimester: Secondary | ICD-10-CM

## 2016-11-07 DIAGNOSIS — Z1379 Encounter for other screening for genetic and chromosomal anomalies: Secondary | ICD-10-CM

## 2016-11-07 NOTE — Progress Notes (Signed)
Review of ULTRASOUND.    I have personally reviewed images and report of recent ultrasound done at [facility].    Plan of management to be discussed with patient. Unable to complete First Screen, so repeat one week. Monitor nausea

## 2016-11-14 ENCOUNTER — Ambulatory Visit (INDEPENDENT_AMBULATORY_CARE_PROVIDER_SITE_OTHER): Payer: Commercial Managed Care - PPO

## 2016-11-14 ENCOUNTER — Other Ambulatory Visit: Payer: Self-pay | Admitting: Obstetrics & Gynecology

## 2016-11-14 ENCOUNTER — Ambulatory Visit (INDEPENDENT_AMBULATORY_CARE_PROVIDER_SITE_OTHER): Payer: Commercial Managed Care - PPO | Admitting: Obstetrics and Gynecology

## 2016-11-14 VITALS — BP 114/70 | Wt 132.0 lb

## 2016-11-14 DIAGNOSIS — Z3A12 12 weeks gestation of pregnancy: Secondary | ICD-10-CM

## 2016-11-14 DIAGNOSIS — Z362 Encounter for other antenatal screening follow-up: Secondary | ICD-10-CM | POA: Diagnosis not present

## 2016-11-14 DIAGNOSIS — Z1379 Encounter for other screening for genetic and chromosomal anomalies: Secondary | ICD-10-CM

## 2016-11-14 DIAGNOSIS — Z3490 Encounter for supervision of normal pregnancy, unspecified, unspecified trimester: Secondary | ICD-10-CM

## 2016-11-14 DIAGNOSIS — Z3A13 13 weeks gestation of pregnancy: Secondary | ICD-10-CM

## 2016-11-14 NOTE — Progress Notes (Signed)
  Routine Prenatal Care Visit  Subjective  Valerie Gardner is a 26 y.o. G2P1001 at [redacted]w[redacted]d being seen today for ongoing prenatal care.  She is currently monitored for the following issues for this low-risk pregnancy and has Irregular contractions; Hyperandrogenemia; and Encounter for supervision of low-risk pregnancy, antepartum on her problem list.  ----------------------------------------------------------------------------------- Patient reports no complaints.    . Vag. Bleeding: None.  Movement: Absent. Denies leaking of fluid.  ----------------------------------------------------------------------------------- The following portions of the patient's history were reviewed and updated as appropriate: allergies, current medications, past family history, past medical history, past social history, past surgical history and problem list. Problem list updated.   Objective  Blood pressure 114/70, weight 132 lb (59.9 kg), last menstrual period 08/12/2016, unknown if currently breastfeeding. Pregravid weight 129 lb (58.5 kg) Total Weight Gain 3 lb (1.361 kg) Urinalysis: Urine Protein: Negative Urine Glucose: Negative  Fetal Status: Fetal Heart Rate (bpm): present   Movement: Absent     General:  Alert, oriented and cooperative. Patient is in no acute distress.  Skin: Skin is warm and dry. No rash noted.   Cardiovascular: Normal heart rate noted  Respiratory: Normal respiratory effort, no problems with respiration noted  Abdomen: Soft, gravid, appropriate for gestational age. Pain/Pressure: Absent     Pelvic:  Cervical exam deferred        Extremities: Normal range of motion.     Mental Status: Normal mood and affect. Normal behavior. Normal judgment and thought content.   Assessment   26 y.o. G2P1001 at [redacted]w[redacted]d by  05/19/2017, by Last Menstrual Period presenting for routine prenatal visit  Plan   pregnancy Problems (from 10/02/16 to present)    Problem Noted Resolved   Encounter for  supervision of low-risk pregnancy, antepartum 11/07/2016 by Gae Dry, MD No   Overview Addendum 11/14/2016 11:40 AM by Will Bonnet, MD    Clinic [facility] Prenatal Labs  Dating L=8 Blood type: A/Positive/-- (08/30 0851)   Genetic Screen 1 Screen: [x]  10/12   AFP:     NIPS: Antibody:Negative (08/30 0851)  Anatomic [place]  Rubella: 3.52 (08/30 0851) Varicella: @VZVIGG @  GTT Third trimester:  RPR: Non Reactive (08/30 0851)   Rhogam n/a HBsAg: Negative (08/30 0851)   TDaP vaccine                        Flu Shot: [x]  declines HIV:   neg  Baby Food                                GBS:   Contraception  Pap:  CBB     CS/VBAC    Support Person           Please refer to After Visit Summary for other counseling recommendations.   Return in about 4 weeks (around 12/12/2016) for Routine Prenatal Appointment.  Prentice Docker, MD  11/14/2016 11:39 AM

## 2016-11-18 LAB — FIRST TRIMESTER SCREEN W/NT
CRL: 79.3 mm
DIA MoM: 0.64
DIA Value: 152.1 pg/mL
GEST AGE-COLLECT: 13.3 wk
MATERNAL AGE AT EDD: 26.6 a
NUCHAL TRANSLUCENCY MOM: 0.88
NUCHAL TRANSLUCENCY: 1.7 mm
Number of Fetuses: 1
PAPP-A MOM: 1.41
PAPP-A Value: 2050.2 ng/mL
TEST RESULTS: NEGATIVE
WEIGHT: 132 [lb_av]
hCG MoM: 0.55
hCG Value: 46.2 IU/mL

## 2016-11-19 ENCOUNTER — Telehealth: Payer: Self-pay

## 2016-11-19 NOTE — Telephone Encounter (Signed)
-----   Message from Will Bonnet, MD sent at 11/19/2016  8:49 AM EDT ----- Would you mind calling Valerie Gardner and letting her know about her first trimester screen? Looks negative.  So, low risk for having a baby with Down Syndrome and Trisomy 18. Thank you! Prentice Docker, MD

## 2016-11-19 NOTE — Telephone Encounter (Signed)
Trying to call pt, please let me know when she calls back

## 2016-12-12 ENCOUNTER — Ambulatory Visit (INDEPENDENT_AMBULATORY_CARE_PROVIDER_SITE_OTHER): Payer: Commercial Managed Care - PPO | Admitting: Certified Nurse Midwife

## 2016-12-12 ENCOUNTER — Encounter: Payer: Self-pay | Admitting: Certified Nurse Midwife

## 2016-12-12 VITALS — BP 108/58 | Wt 135.0 lb

## 2016-12-12 DIAGNOSIS — Z3A17 17 weeks gestation of pregnancy: Secondary | ICD-10-CM

## 2016-12-12 DIAGNOSIS — Z3490 Encounter for supervision of normal pregnancy, unspecified, unspecified trimester: Secondary | ICD-10-CM

## 2016-12-12 NOTE — Progress Notes (Signed)
ROB  Pt is having some pain as of yesterday on the right side of her lower abdomen at all times  Declined flu shot

## 2016-12-12 NOTE — Progress Notes (Signed)
ROB at [facility] 3 days. First trimester test negative. Nausea and vomiting has subsided. Last night had a pain in her RLQ, lessened after sitting down. No vaginal bleeding. No dysuria. Pain radiated into vulva. No diarrhea. No fever. 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  Abdominal exam: FH at U-1. FHTs 145 Mild tenderness in right and left lower quadrants with deep palpation Probable round ligament pain Anatomy scan and ROB in 2-3 weeks. Dalia Heading, CNM

## 2016-12-29 ENCOUNTER — Ambulatory Visit (INDEPENDENT_AMBULATORY_CARE_PROVIDER_SITE_OTHER): Payer: Commercial Managed Care - PPO

## 2016-12-29 ENCOUNTER — Ambulatory Visit (INDEPENDENT_AMBULATORY_CARE_PROVIDER_SITE_OTHER): Payer: Commercial Managed Care - PPO | Admitting: Obstetrics and Gynecology

## 2016-12-29 ENCOUNTER — Encounter: Payer: Self-pay | Admitting: Obstetrics and Gynecology

## 2016-12-29 VITALS — BP 110/68 | Wt 144.0 lb

## 2016-12-29 DIAGNOSIS — Z3A19 19 weeks gestation of pregnancy: Secondary | ICD-10-CM

## 2016-12-29 DIAGNOSIS — Z3490 Encounter for supervision of normal pregnancy, unspecified, unspecified trimester: Secondary | ICD-10-CM | POA: Diagnosis not present

## 2016-12-29 NOTE — Progress Notes (Signed)
  Routine Prenatal Care Visit  Subjective  Valerie Gardner is a 26 y.o. G2P1001 at [redacted]w[redacted]d being seen today for ongoing prenatal care.  She is currently monitored for the following issues for this low-risk pregnancy and has Irregular contractions; Hyperandrogenemia; and Encounter for supervision of low-risk pregnancy, antepartum on their problem list.  ----------------------------------------------------------------------------------- Patient reports no complaints.    . Vag. Bleeding: None.  Movement: Present. Denies leaking of fluid.  Anatomy screen incomplete.  F/u next visit.  Discussed bilateral choroid plexus cysts.  Should resolve on their own.  ----------------------------------------------------------------------------------- The following portions of the patient's history were reviewed and updated as appropriate: allergies, current medications, past family history, past medical history, past social history, past surgical history and problem list. Problem list updated. Objective  Blood pressure 110/68, weight 144 lb (65.3 kg), last menstrual period 08/12/2016, unknown if currently breastfeeding. Pregravid weight 129 lb (58.5 kg) Total Weight Gain 15 lb (6.804 kg) Urinalysis:      Fetal Status: Fetal Heart Rate (bpm): Present   Movement: Present     General:  Alert, oriented and cooperative. Patient is in no acute distress.  Skin: Skin is warm and dry. No rash noted.   Cardiovascular: Normal heart rate noted  Respiratory: Normal respiratory effort, no problems with respiration noted  Abdomen: Soft, gravid, appropriate for gestational age. Pain/Pressure: Absent     Pelvic:  Cervical exam deferred        Extremities: Normal range of motion.     Mental Status: Normal mood and affect. Normal behavior. Normal judgment and thought content.   Assessment   26 y.o. G2P1001 at [redacted]w[redacted]d by  05/19/2017, by Last Menstrual Period presenting for routine prenatal visit  Plan   pregnancy  Problems (from 10/02/16 to present)    Problem Noted Resolved   Encounter for supervision of low-risk pregnancy, antepartum 11/07/2016 by Gae Dry, MD No   Overview Addendum 12/12/2016  9:44 AM by Dalia Heading, [place] Prenatal Labs  Dating L=8 Blood type: A/Positive/-- (08/30 0851)   Genetic Screen 1 Screen: [x]  10/12 neg   AFP:     NIPS: Antibody:Negative (08/30 0851)  Anatomic [place]  Rubella: 3.52 (08/30 0851) Varicella: Immune  GTT Third trimester:  RPR: Non Reactive (08/30 0851)   Rhogam n/a HBsAg: Negative (08/30 0851)   TDaP vaccine                       Flu Shot: HIV:   non reactive  Baby Food                                GBS:   Contraception  Pap:NIL  CBB     CS/VBAC    Support Person            Preterm labor symptoms and general obstetric precautions including but not limited to vaginal bleeding, contractions, leaking of fluid and fetal movement were reviewed in detail with the patient. Please refer to After Visit Summary for other counseling recommendations.   Return in about 2 weeks (around 01/12/2017) for schedule completion anatomy ultrasound routine prenatal.  Prentice Docker, MD  12/29/2016 9:50 AM

## 2017-01-13 ENCOUNTER — Other Ambulatory Visit: Payer: Commercial Managed Care - PPO

## 2017-01-13 ENCOUNTER — Encounter: Payer: Commercial Managed Care - PPO | Admitting: Obstetrics and Gynecology

## 2017-01-22 ENCOUNTER — Ambulatory Visit (INDEPENDENT_AMBULATORY_CARE_PROVIDER_SITE_OTHER): Payer: Commercial Managed Care - PPO

## 2017-01-22 ENCOUNTER — Ambulatory Visit (INDEPENDENT_AMBULATORY_CARE_PROVIDER_SITE_OTHER): Payer: Commercial Managed Care - PPO | Admitting: Certified Nurse Midwife

## 2017-01-22 ENCOUNTER — Other Ambulatory Visit: Payer: Self-pay | Admitting: Obstetrics & Gynecology

## 2017-01-22 VITALS — BP 102/62 | Wt 142.0 lb

## 2017-01-22 DIAGNOSIS — Z3490 Encounter for supervision of normal pregnancy, unspecified, unspecified trimester: Secondary | ICD-10-CM

## 2017-01-22 DIAGNOSIS — Z362 Encounter for other antenatal screening follow-up: Secondary | ICD-10-CM

## 2017-01-22 DIAGNOSIS — B373 Candidiasis of vulva and vagina: Secondary | ICD-10-CM

## 2017-01-22 DIAGNOSIS — Z3A23 23 weeks gestation of pregnancy: Secondary | ICD-10-CM

## 2017-01-22 DIAGNOSIS — Z131 Encounter for screening for diabetes mellitus: Secondary | ICD-10-CM

## 2017-01-22 DIAGNOSIS — B3731 Acute candidiasis of vulva and vagina: Secondary | ICD-10-CM

## 2017-01-22 DIAGNOSIS — N9089 Other specified noninflammatory disorders of vulva and perineum: Secondary | ICD-10-CM

## 2017-01-22 MED ORDER — TERCONAZOLE 0.8 % VA CREA: 1.0000 | TOPICAL_CREAM | Freq: Every day | VAGINAL | 0 refills | Status: DC

## 2017-01-22 NOTE — Progress Notes (Signed)
Review of ULTRASOUND.    I have personally reviewed images and report of recent ultrasound done at [facility].    Plan of management to be discussed with patient.   Barnett Applebaum, MD, [facility], [facility] 01/22/2017  1:18 PM

## 2017-01-22 NOTE — Progress Notes (Signed)
Pt c/o hip pain and swelling in vaginal area. Completion anatomy today.

## 2017-01-26 NOTE — Progress Notes (Signed)
ROB/ FU anatomy scan at 23wk2d: Baby active. C/o pelvic type pain and BH contractions. Also having vulvar edema and irritation Follow up anatomy scan: normal and complete. CP cysts have resolved Vulva: swollen and inflamed labia Vagina: white discharge Cervix: L/T/C Wet prep positive for hyphae and budding yeast. RX for Terazol #3  sent to pharmacy ROB 4 weeks and 28 week labs

## 2017-02-03 NOTE — L&D Delivery Note (Addendum)
Date of delivery: 04/29/2017 Estimated Date of Delivery: 05/19/17 Patient's last menstrual period was 08/12/2016 (exact date). EGA: [redacted]w[redacted]d  Delivery Note At 11:49 AM a viable female was delivered via Vaginal, Spontaneous (Presentation: OA;  LOA).  APGAR: 8, 9; weight 6 lb 11.6 oz (3050 g).   Placenta status: spontaneous, intact.   Cord:  with the following complications: none.  Cord pH: NA  Called to see patient.  Mom pushed to deliver a viable female infant.  The head followed by shoulders, which delivered without difficulty, and the rest of the body.  Loose nuchal cord noted which was reduced following delivery of baby.  Baby to mom's chest.  Cord clamped and cut after 3 min delay.  No cord blood obtained.  Placenta delivered spontaneously, intact, with a 3-vessel cord.  All counts correct.  Hemostasis obtained with IV pitocin and fundal massage.   Anesthesia:  epidural Episiotomy: None Lacerations: hemostatic small periurethral scrape, not repaired Suture Repair: NA Est. Blood Loss (mL): 200  Mom to postpartum.  Baby to Couplet care / Skin to Skin.  Rod Can, CNM 04/29/2017, 12:20 PM

## 2017-02-19 ENCOUNTER — Encounter: Payer: Self-pay | Admitting: Advanced Practice Midwife

## 2017-02-19 ENCOUNTER — Other Ambulatory Visit: Payer: Commercial Managed Care - PPO

## 2017-02-19 ENCOUNTER — Ambulatory Visit (INDEPENDENT_AMBULATORY_CARE_PROVIDER_SITE_OTHER): Payer: Commercial Managed Care - PPO | Admitting: Advanced Practice Midwife

## 2017-02-19 VITALS — BP 110/64 | Wt 151.0 lb

## 2017-02-19 DIAGNOSIS — Z3A27 27 weeks gestation of pregnancy: Secondary | ICD-10-CM

## 2017-02-19 DIAGNOSIS — Z131 Encounter for screening for diabetes mellitus: Secondary | ICD-10-CM

## 2017-02-19 DIAGNOSIS — Z3490 Encounter for supervision of normal pregnancy, unspecified, unspecified trimester: Secondary | ICD-10-CM

## 2017-02-19 NOTE — Progress Notes (Signed)
  Routine Prenatal Care Visit  Subjective  Valerie Gardner is a 27 y.o. G2P1001 at [redacted]w[redacted]d being seen today for ongoing prenatal care.  She is currently monitored for the following issues for this low-risk pregnancy and has Irregular contractions; Hyperandrogenemia; and Encounter for supervision of low-risk pregnancy, antepartum on their problem list.  ----------------------------------------------------------------------------------- Patient reports no complaints.   Contractions: Not present. Vag. Bleeding: None.  Movement: Present. Denies leaking of fluid.  ----------------------------------------------------------------------------------- The following portions of the patient's history were reviewed and updated as appropriate: allergies, current medications, past family history, past medical history, past social history, past surgical history and problem list. Problem list updated.   Objective  Blood pressure 110/64, weight 151 lb (68.5 kg), last menstrual period 08/12/2016 Pregravid weight 129 lb (58.5 kg) Total Weight Gain 22 lb (9.979 kg) Urinalysis: Urine Protein: Negative Urine Glucose: Negative  Fetal Status: Fetal Heart Rate (bpm): 140 Fundal Height: 27 cm Movement: Present     General:  Alert, oriented and cooperative. Patient is in no acute distress.  Skin: Skin is warm and dry. No rash noted.   Cardiovascular: Normal heart rate noted  Respiratory: Normal respiratory effort, no problems with respiration noted  Abdomen: Soft, gravid, appropriate for gestational age. Pain/Pressure: Absent     Pelvic:  Cervical exam deferred        Extremities: Normal range of motion.  Edema: None  Mental Status: Normal mood and affect. Normal behavior. Normal judgment and thought content.   Assessment   27 y.o. G2P1001 at [redacted]w[redacted]d by  05/19/2017, by Last Menstrual Period presenting for routine prenatal visit  Plan   pregnancy Problems (from 10/02/16 to present)    Problem Noted Resolved     Encounter for supervision of low-risk pregnancy, antepartum 11/07/2016 by Gae Dry, MD No   Overview Addendum 12/12/2016  9:44 AM by Dalia Heading, [place] Prenatal Labs  Dating L=8 Blood type: A/Positive/-- (08/30 0851)   Genetic Screen 1 Screen: [x]  10/12 neg   AFP:     NIPS: Antibody:Negative (08/30 0851)  Anatomic [place]  Rubella: 3.52 (08/30 0851) Varicella: Immune  GTT Third trimester:  RPR: Non Reactive (08/30 0851)   Rhogam n/a HBsAg: Negative (08/30 0851)   TDaP vaccine                       Flu Shot: HIV:   non reactive  Baby Food                                GBS:   Contraception  Pap:NIL  CBB     CS/VBAC    Support Person                Preterm labor symptoms and general obstetric precautions including but not limited to vaginal bleeding, contractions, leaking of fluid and fetal movement were reviewed in detail with the patient. Please refer to After Visit Summary for other counseling recommendations.   Return in about 2 weeks (around 03/05/2017) for rob.  Rod Can, CNM  02/19/2017 10:20 AM

## 2017-02-19 NOTE — Progress Notes (Signed)
28 week labs today. No vb. No lof.  

## 2017-02-19 NOTE — Patient Instructions (Signed)
Third Trimester of Pregnancy The third trimester is from week 28 through week 40 (months 7 through 9). The third trimester is a time when the unborn baby (fetus) is growing rapidly. At the end of the ninth month, the fetus is about 20 inches in length and weighs 6-10 pounds. Body changes during your third trimester Your body will continue to go through many changes during pregnancy. The changes vary from woman to woman. During the third trimester:  Your weight will continue to increase. You can expect to gain 25-35 pounds (11-16 kg) by the end of the pregnancy.  You may begin to get stretch marks on your hips, abdomen, and breasts.  You may urinate more often because the fetus is moving lower into your pelvis and pressing on your bladder.  You may develop or continue to have heartburn. This is caused by increased hormones that slow down muscles in the digestive tract.  You may develop or continue to have constipation because increased hormones slow digestion and cause the muscles that push waste through your intestines to relax.  You may develop hemorrhoids. These are swollen veins (varicose veins) in the rectum that can itch or be painful.  You may develop swollen, bulging veins (varicose veins) in your legs.  You may have increased body aches in the pelvis, back, or thighs. This is due to weight gain and increased hormones that are relaxing your joints.  You may have changes in your hair. These can include thickening of your hair, rapid growth, and changes in texture. Some women also have hair loss during or after pregnancy, or hair that feels dry or thin. Your hair will most likely return to normal after your baby is born.  Your breasts will continue to grow and they will continue to become tender. A yellow fluid (colostrum) may leak from your breasts. This is the first milk you are producing for your baby.  Your belly button may stick out.  You may notice more swelling in your hands,  face, or ankles.  You may have increased tingling or numbness in your hands, arms, and legs. The skin on your belly may also feel numb.  You may feel short of breath because of your expanding uterus.  You may have more problems sleeping. This can be caused by the size of your belly, increased need to urinate, and an increase in your body's metabolism.  You may notice the fetus "dropping," or moving lower in your abdomen (lightening).  You may have increased vaginal discharge.  You may notice your joints feel loose and you may have pain around your pelvic bone.  What to expect at prenatal visits You will have prenatal exams every 2 weeks until week 36. Then you will have weekly prenatal exams. During a routine prenatal visit:  You will be weighed to make sure you and the baby are growing normally.  Your blood pressure will be taken.  Your abdomen will be measured to track your baby's growth.  The fetal heartbeat will be listened to.  Any test results from the previous visit will be discussed.  You may have a cervical check near your due date to see if your cervix has softened or thinned (effaced).  You will be tested for Group B streptococcus. This happens between 35 and 37 weeks.  Your health care provider may ask you:  What your birth plan is.  How you are feeling.  If you are feeling the baby move.  If you have had   any abnormal symptoms, such as leaking fluid, bleeding, severe headaches, or abdominal cramping.  If you are using any tobacco products, including cigarettes, chewing tobacco, and electronic cigarettes.  If you have any questions.  Other tests or screenings that may be performed during your third trimester include:  Blood tests that check for low iron levels (anemia).  Fetal testing to check the health, activity level, and growth of the fetus. Testing is done if you have certain medical conditions or if there are problems during the  pregnancy.  Nonstress test (NST). This test checks the health of your baby to make sure there are no signs of problems, such as the baby not getting enough oxygen. During this test, a belt is placed around your belly. The baby is made to move, and its heart rate is monitored during movement.  What is false labor? False labor is a condition in which you feel small, irregular tightenings of the muscles in the womb (contractions) that usually go away with rest, changing position, or drinking water. These are called Braxton Hicks contractions. Contractions may last for hours, days, or even weeks before true labor sets in. If contractions come at regular intervals, become more frequent, increase in intensity, or become painful, you should see your health care provider. What are the signs of labor?  Abdominal cramps.  Regular contractions that start at 10 minutes apart and become stronger and more frequent with time.  Contractions that start on the top of the uterus and spread down to the lower abdomen and back.  Increased pelvic pressure and dull back pain.  A watery or bloody mucus discharge that comes from the vagina.  Leaking of amniotic fluid. This is also known as your "water breaking." It could be a slow trickle or a gush. Let your health care provider know if it has a color or strange odor. If you have any of these signs, call your health care provider right away, even if it is before your due date. Follow these instructions at home: Medicines  Follow your health care provider's instructions regarding medicine use. Specific medicines may be either safe or unsafe to take during pregnancy.  Take a prenatal vitamin that contains at least 600 micrograms (mcg) of folic acid.  If you develop constipation, try taking a stool softener if your health care provider approves. Eating and drinking  Eat a balanced diet that includes fresh fruits and vegetables, whole grains, good sources of protein  such as meat, eggs, or tofu, and low-fat dairy. Your health care provider will help you determine the amount of weight gain that is right for you.  Avoid raw meat and uncooked cheese. These carry germs that can cause birth defects in the baby.  If you have low calcium intake from food, talk to your health care provider about whether you should take a daily calcium supplement.  Eat four or five small meals rather than three large meals a day.  Limit foods that are high in fat and processed sugars, such as fried and sweet foods.  To prevent constipation: ? Drink enough fluid to keep your urine clear or pale yellow. ? Eat foods that are high in fiber, such as fresh fruits and vegetables, whole grains, and beans. Activity  Exercise only as directed by your health care provider. Most women can continue their usual exercise routine during pregnancy. Try to exercise for 30 minutes at least 5 days a week. Stop exercising if you experience uterine contractions.  Avoid heavy   lifting.  Do not exercise in extreme heat or humidity, or at high altitudes.  Wear low-heel, comfortable shoes.  Practice good posture.  You may continue to have sex unless your health care provider tells you otherwise. Relieving pain and discomfort  Take frequent breaks and rest with your legs elevated if you have leg cramps or low back pain.  Take warm sitz baths to soothe any pain or discomfort caused by hemorrhoids. Use hemorrhoid cream if your health care provider approves.  Wear a good support bra to prevent discomfort from breast tenderness.  If you develop varicose veins: ? Wear support pantyhose or compression stockings as told by your healthcare provider. ? Elevate your feet for 15 minutes, 3-4 times a day. Prenatal care  Write down your questions. Take them to your prenatal visits.  Keep all your prenatal visits as told by your health care provider. This is important. Safety  Wear your seat belt at  all times when driving.  Make a list of emergency phone numbers, including numbers for family, friends, the hospital, and police and fire departments. General instructions  Avoid cat litter boxes and soil used by cats. These carry germs that can cause birth defects in the baby. If you have a cat, ask someone to clean the litter box for you.  Do not travel far distances unless it is absolutely necessary and only with the approval of your health care provider.  Do not use hot tubs, steam rooms, or saunas.  Do not drink alcohol.  Do not use any products that contain nicotine or tobacco, such as cigarettes and e-cigarettes. If you need help quitting, ask your health care provider.  Do not use any medicinal herbs or unprescribed drugs. These chemicals affect the formation and growth of the baby.  Do not douche or use tampons or scented sanitary pads.  Do not cross your legs for long periods of time.  To prepare for the arrival of your baby: ? Take prenatal classes to understand, practice, and ask questions about labor and delivery. ? Make a trial run to the hospital. ? Visit the hospital and tour the maternity area. ? Arrange for maternity or paternity leave through employers. ? Arrange for family and friends to take care of pets while you are in the hospital. ? Purchase a rear-facing car seat and make sure you know how to install it in your car. ? Pack your hospital bag. ? Prepare the baby's nursery. Make sure to remove all pillows and stuffed animals from the baby's crib to prevent suffocation.  Visit your dentist if you have not gone during your pregnancy. Use a soft toothbrush to brush your teeth and be gentle when you floss. Contact a health care provider if:  You are unsure if you are in labor or if your water has broken.  You become dizzy.  You have mild pelvic cramps, pelvic pressure, or nagging pain in your abdominal area.  You have lower back pain.  You have persistent  nausea, vomiting, or diarrhea.  You have an unusual or bad smelling vaginal discharge.  You have pain when you urinate. Get help right away if:  Your water breaks before 37 weeks.  You have regular contractions less than 5 minutes apart before 37 weeks.  You have a fever.  You are leaking fluid from your vagina.  You have spotting or bleeding from your vagina.  You have severe abdominal pain or cramping.  You have rapid weight loss or weight gain.    You have shortness of breath with chest pain.  You notice sudden or extreme swelling of your face, hands, ankles, feet, or legs.  Your baby makes fewer than 10 movements in 2 hours.  You have severe headaches that do not go away when you take medicine.  You have vision changes. Summary  The third trimester is from week 28 through week 40, months 7 through 9. The third trimester is a time when the unborn baby (fetus) is growing rapidly.  During the third trimester, your discomfort may increase as you and your baby continue to gain weight. You may have abdominal, leg, and back pain, sleeping problems, and an increased need to urinate.  During the third trimester your breasts will keep growing and they will continue to become tender. A yellow fluid (colostrum) may leak from your breasts. This is the first milk you are producing for your baby.  False labor is a condition in which you feel small, irregular tightenings of the muscles in the womb (contractions) that eventually go away. These are called Braxton Hicks contractions. Contractions may last for hours, days, or even weeks before true labor sets in.  Signs of labor can include: abdominal cramps; regular contractions that start at 10 minutes apart and become stronger and more frequent with time; watery or bloody mucus discharge that comes from the vagina; increased pelvic pressure and dull back pain; and leaking of amniotic fluid. This information is not intended to replace advice  given to you by your health care provider. Make sure you discuss any questions you have with your health care provider. Document Released: 01/14/2001 Document Revised: 06/28/2015 Document Reviewed: 03/23/2012 Elsevier Interactive Patient Education  2017 [employer]  

## 2017-02-20 LAB — 28 WEEK RH+PANEL
BASOS: 1 %
Basophils Absolute: 0 10*3/uL (ref 0.0–0.2)
EOS (ABSOLUTE): 0.1 10*3/uL (ref 0.0–0.4)
EOS: 1 %
GESTATIONAL DIABETES SCREEN: 75 mg/dL (ref 65–139)
HEMOGLOBIN: 10 g/dL — AB (ref 11.1–15.9)
HIV Screen 4th Generation wRfx: NONREACTIVE
Hematocrit: 30 % — ABNORMAL LOW (ref 34.0–46.6)
IMMATURE GRANS (ABS): 0.1 10*3/uL (ref 0.0–0.1)
Immature Granulocytes: 1 %
LYMPHS: 27 %
Lymphocytes Absolute: 1.6 10*3/uL (ref 0.7–3.1)
MCH: 29.4 pg (ref 26.6–33.0)
MCHC: 33.3 g/dL (ref 31.5–35.7)
MCV: 88 fL (ref 79–97)
MONOS ABS: 0.5 10*3/uL (ref 0.1–0.9)
Monocytes: 9 %
NEUTROS ABS: 3.8 10*3/uL (ref 1.4–7.0)
Neutrophils: 61 %
Platelets: 235 10*3/uL (ref 150–379)
RBC: 3.4 x10E6/uL — AB (ref 3.77–5.28)
RDW: 13.2 % (ref 12.3–15.4)
RPR: NONREACTIVE
WBC: 6 10*3/uL (ref 3.4–10.8)

## 2017-03-05 ENCOUNTER — Ambulatory Visit (INDEPENDENT_AMBULATORY_CARE_PROVIDER_SITE_OTHER): Payer: Commercial Managed Care - PPO | Admitting: Advanced Practice Midwife

## 2017-03-05 ENCOUNTER — Encounter: Payer: Self-pay | Admitting: Advanced Practice Midwife

## 2017-03-05 VITALS — BP 114/74 | Wt 152.0 lb

## 2017-03-05 DIAGNOSIS — Z3A29 29 weeks gestation of pregnancy: Secondary | ICD-10-CM

## 2017-03-05 NOTE — Progress Notes (Signed)
No vb. No lof.  

## 2017-03-05 NOTE — Progress Notes (Signed)
  Routine Prenatal Care Visit  Subjective  Valerie Gardner is a 27 y.o. G2P1001 at [redacted]w[redacted]d being seen today for ongoing prenatal care.  She is currently monitored for the following issues for this low-risk pregnancy and has Irregular contractions; Hyperandrogenemia; and Encounter for supervision of low-risk pregnancy, antepartum on their problem list.  ----------------------------------------------------------------------------------- Patient reports no complaints.   Contractions: Not present. Vag. Bleeding: None.  Movement: Present. Denies leaking of fluid.  ----------------------------------------------------------------------------------- The following portions of the patient's history were reviewed and updated as appropriate: allergies, current medications, past family history, past medical history, past social history, past surgical history and problem list. Problem list updated.   Objective  Blood pressure 114/74, weight 152 lb (68.9 kg), last menstrual period 08/12/2016 Pregravid weight 129 lb (58.5 kg) Total Weight Gain 23 lb (10.4 kg) Urinalysis:      Fetal Status: Fetal Heart Rate (bpm): 131 Fundal Height: 29 cm Movement: Present     General:  Alert, oriented and cooperative. Patient is in no acute distress.  Skin: Skin is warm and dry. No rash noted.   Cardiovascular: Normal heart rate noted  Respiratory: Normal respiratory effort, no problems with respiration noted  Abdomen: Soft, gravid, appropriate for gestational age. Pain/Pressure: Absent     Pelvic:  Cervical exam deferred        Extremities: Normal range of motion.  Edema: None  Mental Status: Normal mood and affect. Normal behavior. Normal judgment and thought content.   Assessment   27 y.o. G2P1001 at [redacted]w[redacted]d by  05/19/2017, by Last Menstrual Period presenting for routine prenatal visit  Plan   pregnancy Problems (from 10/02/16 to present)    Problem Noted Resolved   Encounter for supervision of low-risk  pregnancy, antepartum 11/07/2016 by Gae Dry, MD No   Overview Addendum 12/12/2016  9:44 AM by Dalia Heading, [place] Prenatal Labs  Dating L=8 Blood type: A/Positive/-- (08/30 0851)   Genetic Screen 1 Screen: [x]  10/12 neg   AFP:     NIPS: Antibody:Negative (08/30 0851)  Anatomic [place]  Rubella: 3.52 (08/30 0851) Varicella: Immune  GTT Third trimester:  RPR: Non Reactive (08/30 0851)   Rhogam n/a HBsAg: Negative (08/30 0851)   TDaP vaccine                       Flu Shot: HIV:   non reactive  Baby Food                                GBS:   Contraception  Pap:NIL  CBB     CS/VBAC    Support Person                Preterm labor symptoms and general obstetric precautions including but not limited to vaginal bleeding, contractions, leaking of fluid and fetal movement were reviewed in detail with the patient.  TDAP next visit  Return in about 2 weeks (around 03/19/2017) for rob.  Rod Can, CNM  03/05/2017 8:57 AM

## 2017-03-19 ENCOUNTER — Encounter: Payer: Self-pay | Admitting: Advanced Practice Midwife

## 2017-03-19 ENCOUNTER — Ambulatory Visit (INDEPENDENT_AMBULATORY_CARE_PROVIDER_SITE_OTHER): Payer: Commercial Managed Care - PPO | Admitting: Advanced Practice Midwife

## 2017-03-19 VITALS — BP 118/70 | Wt 152.0 lb

## 2017-03-19 DIAGNOSIS — Z23 Encounter for immunization: Secondary | ICD-10-CM

## 2017-03-19 DIAGNOSIS — Z3A31 31 weeks gestation of pregnancy: Secondary | ICD-10-CM

## 2017-03-19 NOTE — Progress Notes (Signed)
TDAP today. No vb. No lof.  °

## 2017-03-19 NOTE — Addendum Note (Signed)
Addended by: Brien Few on: 03/19/2017 08:56 AM   Modules accepted: Orders

## 2017-03-19 NOTE — Progress Notes (Signed)
  Routine Prenatal Care Visit  Subjective  Valerie Gardner is a 27 y.o. G2P1001 at [redacted]w[redacted]d being seen today for ongoing prenatal care.  She is currently monitored for the following issues for this low-risk pregnancy and has Irregular contractions; Hyperandrogenemia; and Encounter for supervision of low-risk pregnancy, antepartum on their problem list.  ----------------------------------------------------------------------------------- Patient reports feeling tired from not enough sleep last night.   Contractions: Not present. Vag. Bleeding: None.  Movement: Present. Denies leaking of fluid.  ----------------------------------------------------------------------------------- The following portions of the patient's history were reviewed and updated as appropriate: allergies, current medications, past family history, past medical history, past social history, past surgical history and problem list. Problem list updated.   Objective  Blood pressure 118/70, weight 152 lb (68.9 kg), last menstrual period 08/12/2016, unknown if currently breastfeeding. Pregravid weight 129 lb (58.5 kg) Total Weight Gain 23 lb (10.4 kg) Urinalysis: Urine Protein: Negative Urine Glucose: Negative  Fetal Status: Fetal Heart Rate (bpm): 152 Fundal Height: 31 cm Movement: Present     General:  Alert, oriented and cooperative. Patient is in no acute distress.  Skin: Skin is warm and dry. No rash noted.   Cardiovascular: Normal heart rate noted  Respiratory: Normal respiratory effort, no problems with respiration noted  Abdomen: Soft, gravid, appropriate for gestational age. Pain/Pressure: Absent     Pelvic:  Cervical exam deferred        Extremities: Normal range of motion.  Edema: None  Mental Status: Normal mood and affect. Normal behavior. Normal judgment and thought content.   Assessment   27 y.o. G2P1001 at [redacted]w[redacted]d by  05/19/2017, by Last Menstrual Period presenting for routine prenatal visit  Plan    pregnancy Problems (from 10/02/16 to present)    Problem Noted Resolved   Encounter for supervision of low-risk pregnancy, antepartum 11/07/2016 by Gae Dry, MD No   Overview Addendum 12/12/2016  9:44 AM by Dalia Heading, [place] Prenatal Labs  Dating L=8 Blood type: A/Positive/-- (08/30 0851)   Genetic Screen 1 Screen: [x]  10/12 neg   AFP:     NIPS: Antibody:Negative (08/30 0851)  Anatomic [place]  Rubella: 3.52 (08/30 0851) Varicella: Immune  GTT Third trimester:  RPR: Non Reactive (08/30 0851)   Rhogam n/a HBsAg: Negative (08/30 0851)   TDaP vaccine  03/19/17 Flu Shot: HIV:   non reactive  Baby Food                                GBS:   Contraception  Pap:NIL  CBB     CS/VBAC    Support Person [name]               Preterm labor symptoms and general obstetric precautions including but not limited to vaginal bleeding, contractions, leaking of fluid and fetal movement were reviewed in detail with the patient.    Return in about 2 weeks (around 04/02/2017) for rob.  Rod Can, CNM  03/19/2017 8:48 AM

## 2017-04-02 ENCOUNTER — Encounter: Payer: Self-pay | Admitting: Maternal Newborn

## 2017-04-02 ENCOUNTER — Ambulatory Visit (INDEPENDENT_AMBULATORY_CARE_PROVIDER_SITE_OTHER): Payer: Commercial Managed Care - PPO | Admitting: Maternal Newborn

## 2017-04-02 VITALS — BP 120/70 | Wt 156.0 lb

## 2017-04-02 DIAGNOSIS — Z3490 Encounter for supervision of normal pregnancy, unspecified, unspecified trimester: Secondary | ICD-10-CM

## 2017-04-02 DIAGNOSIS — Z3A33 33 weeks gestation of pregnancy: Secondary | ICD-10-CM

## 2017-04-02 HISTORY — DX: 33 weeks gestation of pregnancy: Z3A.33

## 2017-04-02 NOTE — Progress Notes (Signed)
    Routine Prenatal Care Visit  Subjective  Valerie Gardner is a 27 y.o. G2P1001 at [redacted]w[redacted]d being seen today for ongoing prenatal care.  She is currently monitored for the following issues for this low-risk pregnancy and has Irregular contractions; Hyperandrogenemia; and Encounter for supervision of low-risk pregnancy, antepartum on their problem list.  ----------------------------------------------------------------------------------- Patient reports no complaints.   Contractions: Not present. Vag. Bleeding: None.  Movement: Present. Denies leaking of fluid.  ----------------------------------------------------------------------------------- The following portions of the patient's history were reviewed and updated as appropriate: allergies, current medications, past family history, past medical history, past social history, past surgical history and problem list. Problem list updated.   Objective  Blood pressure 120/70, weight 156 lb (70.8 kg), last menstrual period 08/12/2016, unknown if currently breastfeeding. Pregravid weight 129 lb (58.5 kg) Total Weight Gain 27 lb (12.2 kg) Urinalysis: Urine Protein: Trace Urine Glucose: Negative  Fetal Status: Fetal Heart Rate (bpm): 152 Fundal Height: 32 cm Movement: Present     General:  Alert, oriented and cooperative. Patient is in no acute distress.  Skin: Skin is warm and dry. No rash noted.   Cardiovascular: Normal heart rate noted  Respiratory: Normal respiratory effort, no problems with respiration noted  Abdomen: Soft, gravid, appropriate for gestational age. Pain/Pressure: Absent     Pelvic:  Cervical exam deferred        Extremities: Normal range of motion.  Edema: None  Mental Status: Normal mood and affect. Normal behavior. Normal judgment and thought content.     Assessment   27 y.o. G2P1001 at [redacted]w[redacted]d, EDD 05/19/2017 by Last Menstrual Period presenting for routine prenatal visit.  Plan   pregnancy Problems (from 10/02/16  to present)    Problem Noted Resolved   Encounter for supervision of low-risk pregnancy, antepartum 11/07/2016 by Gae Dry, MD No   Overview Addendum 12/12/2016  9:44 AM by Dalia Heading, [place] Prenatal Labs  Dating L=8 Blood type: A/Positive/-- (08/30 0851)   Genetic Screen 1 Screen: [x]  10/12 neg   AFP:     NIPS: Antibody:Negative (08/30 0851)  Anatomic [place]  Rubella: 3.52 (08/30 0851) Varicella: Immune  GTT Third trimester:  RPR: Non Reactive (08/30 0851)   Rhogam n/a HBsAg: Negative (08/30 0851)   TDaP vaccine                       Flu Shot: HIV:   non reactive  Baby Food                                GBS:   Contraception  Pap:NIL  CBB     CS/VBAC    Support Person             Discussed onset of spontaneous labor and how it may be different from her previous pregnancy.  Preterm labor symptoms and general obstetric precautions including but  were reviewed with the patient.  Return in about 2 weeks (around 04/16/2017) for [place].  Avel Sensor, CNM 04/02/2017  9:57 AM

## 2017-04-02 NOTE — Progress Notes (Signed)
No concerns.rj 

## 2017-04-16 ENCOUNTER — Encounter: Payer: Self-pay | Admitting: Advanced Practice Midwife

## 2017-04-16 ENCOUNTER — Ambulatory Visit (INDEPENDENT_AMBULATORY_CARE_PROVIDER_SITE_OTHER): Payer: Commercial Managed Care - PPO | Admitting: Advanced Practice Midwife

## 2017-04-16 VITALS — BP 128/74 | Wt 156.0 lb

## 2017-04-16 DIAGNOSIS — Z3A35 35 weeks gestation of pregnancy: Secondary | ICD-10-CM

## 2017-04-16 NOTE — Progress Notes (Signed)
  Routine Prenatal Care Visit  Subjective  Valerie Gardner is a 27 y.o. G2P1001 at [redacted]w[redacted]d being seen today for ongoing prenatal care.  She is currently monitored for the following issues for this low-risk pregnancy and has Irregular contractions; Hyperandrogenemia; and Encounter for supervision of low-risk pregnancy, antepartum on their problem list.  ----------------------------------------------------------------------------------- Patient reports no complaints.   Contractions: Not present. Vag. Bleeding: None.  Movement: Present. Denies leaking of fluid.  ----------------------------------------------------------------------------------- The following portions of the patient's history were reviewed and updated as appropriate: allergies, current medications, past family history, past medical history, past social history, past surgical history and problem list. Problem list updated.   Objective  Blood pressure 128/74, weight 156 lb (70.8 kg), last menstrual period 08/12/2016 Pregravid weight 129 lb (58.5 kg) Total Weight Gain 27 lb (12.2 kg) Urinalysis: Urine Protein: Negative Urine Glucose: Negative  Fetal Status: Fetal Heart Rate (bpm): 132 Fundal Height: 35 cm Movement: Present     General:  Alert, oriented and cooperative. Patient is in no acute distress.  Skin: Skin is warm and dry. No rash noted.   Cardiovascular: Normal heart rate noted  Respiratory: Normal respiratory effort, no problems with respiration noted  Abdomen: Soft, gravid, appropriate for gestational age. Pain/Pressure: Absent     Pelvic:  Cervical exam deferred        Extremities: Normal range of motion.  Edema: None  Mental Status: Normal mood and affect. Normal behavior. Normal judgment and thought content.   Assessment   27 y.o. G2P1001 at [redacted]w[redacted]d by  05/19/2017, by Last Menstrual Period presenting for routine prenatal visit  Plan   pregnancy Problems (from 10/02/16 to present)    Problem Noted Resolved   Encounter for supervision of low-risk pregnancy, antepartum 11/07/2016 by Gae Dry, MD No   Overview Addendum 12/12/2016  9:44 AM by Dalia Heading, [place] Prenatal Labs  Dating L=8 Blood type: A/Positive/-- (08/30 0851)   Genetic Screen 1 Screen: [x]  10/12 neg   AFP:     NIPS: Antibody:Negative (08/30 0851)  Anatomic [place]  Rubella: 3.52 (08/30 0851) Varicella: Immune  GTT Third trimester:  RPR: Non Reactive (08/30 0851)   Rhogam n/a HBsAg: Negative (08/30 0851)   TDaP vaccine  03/19/17 Flu Shot: HIV:   non reactive  Baby Food                                GBS:   Contraception  Pap:NIL  CBB     CS/VBAC NA   Support Person Husband [name]               Preterm labor symptoms and general obstetric precautions including but not limited to vaginal bleeding, contractions, leaking of fluid and fetal movement were reviewed in detail with the patient.   Return in about 1 week (around 04/23/2017) for rob.  Rod Can, CNM 04/16/2017 8:51 AM

## 2017-04-16 NOTE — Progress Notes (Signed)
No vb. No lof.  

## 2017-04-22 ENCOUNTER — Encounter: Payer: Self-pay | Admitting: Advanced Practice Midwife

## 2017-04-22 ENCOUNTER — Ambulatory Visit (INDEPENDENT_AMBULATORY_CARE_PROVIDER_SITE_OTHER): Payer: Commercial Managed Care - PPO | Admitting: Advanced Practice Midwife

## 2017-04-22 VITALS — BP 122/74 | Wt 158.0 lb

## 2017-04-22 DIAGNOSIS — Z3A36 36 weeks gestation of pregnancy: Secondary | ICD-10-CM

## 2017-04-22 DIAGNOSIS — Z113 Encounter for screening for infections with a predominantly sexual mode of transmission: Secondary | ICD-10-CM

## 2017-04-22 DIAGNOSIS — Z3685 Encounter for antenatal screening for Streptococcus B: Secondary | ICD-10-CM

## 2017-04-22 NOTE — Progress Notes (Signed)
GBS/Aptima today. No vb. No lof.

## 2017-04-22 NOTE — Progress Notes (Signed)
  Routine Prenatal Care Visit  Subjective  Victoriana Ellakate Gonsalves is a 27 y.o. G2P1001 at [redacted]w[redacted]d being seen today for ongoing prenatal care.  She is currently monitored for the following issues for this low-risk pregnancy and has Irregular contractions; Hyperandrogenemia; and Encounter for supervision of low-risk pregnancy, antepartum on their problem list.  ----------------------------------------------------------------------------------- Patient reports no complaints.   Contractions: Not present. Vag. Bleeding: None.  Movement: Present. Denies leaking of fluid.  ----------------------------------------------------------------------------------- The following portions of the patient's history were reviewed and updated as appropriate: allergies, current medications, past family history, past medical history, past social history, past surgical history and problem list. Problem list updated.   Objective  Blood pressure 122/74, weight 158 lb (71.7 kg), last menstrual period 08/12/2016, unknown if currently breastfeeding. Pregravid weight 129 lb (58.5 kg) Total Weight Gain 29 lb (13.2 kg) Urinalysis: Urine Protein: Negative Urine Glucose: Negative  Fetal Status: Fetal Heart Rate (bpm): 123 Fundal Height: 36 cm Movement: Present     General:  Alert, oriented and cooperative. Patient is in no acute distress.  Skin: Skin is warm and dry. No rash noted.   Cardiovascular: Normal heart rate noted  Respiratory: Normal respiratory effort, no problems with respiration noted  Abdomen: Soft, gravid, appropriate for gestational age. Pain/Pressure: Absent     Pelvic:  speculum exam for collection of GBS/aptima. Cervix appears closed.        Extremities: Normal range of motion.  Edema: None  Mental Status: Normal mood and affect. Normal behavior. Normal judgment and thought content.   Assessment   27 y.o. G2P1001 at [redacted]w[redacted]d by  05/19/2017, by Last Menstrual Period presenting for routine prenatal  visit  Plan   pregnancy Problems (from 10/02/16 to present)    Problem Noted Resolved   Encounter for supervision of low-risk pregnancy, antepartum 11/07/2016 by Gae Dry, MD No   Overview Addendum 12/12/2016  9:44 AM by Dalia Heading, [place] Prenatal Labs  Dating L=8 Blood type: A/Positive/-- (08/30 0851)   Genetic Screen 1 Screen: [x]  10/12 neg   AFP:     NIPS: Antibody:Negative (08/30 0851)  Anatomic [place]  Rubella: 3.52 (08/30 0851) Varicella: Immune  GTT Third trimester:  RPR: Non Reactive (08/30 0851)   Rhogam n/a HBsAg: Negative (08/30 0851)   TDaP vaccine                       Flu Shot: HIV:   non reactive  Baby Food                                GBS:   Contraception  Pap:NIL  CBB     CS/VBAC    Support Person                Preterm labor symptoms and general obstetric precautions including but not limited to vaginal bleeding, contractions, leaking of fluid and fetal movement were reviewed in detail with the patient. Please refer to After Visit Summary for other counseling recommendations.   Return in about 1 week (around 04/29/2017) for rob.  Rod Can, CNM 04/22/2017 9:01 AM

## 2017-04-22 NOTE — Patient Instructions (Signed)
Vaginal Delivery Vaginal delivery means that you will give birth by pushing your baby out of your birth canal (vagina). A team of health care providers will help you before, during, and after vaginal delivery. Birth experiences are unique for every woman and every pregnancy, and birth experiences vary depending on where you choose to give birth. What should I do to prepare for my baby's birth? Before your baby is born, it is important to talk with your health care provider about:  Your labor and delivery preferences. These may include: ? Medicines that you may be given. ? How you will manage your pain. This might include non-medical pain relief techniques or injectable pain relief such as epidural analgesia. ? How you and your baby will be monitored during labor and delivery. ? Who may be in the labor and delivery room with you. ? Your feelings about surgical delivery of your baby (cesarean delivery, or C-section) if this becomes necessary. ? Your feelings about receiving donated blood through an IV tube (blood transfusion) if this becomes necessary.  Whether you are able: ? To take pictures or videos of the birth. ? To eat during labor and delivery. ? To move around, walk, or change positions during labor and delivery.  What to expect after your baby is born, such as: ? Whether delayed umbilical cord clamping and cutting is offered. ? Who will care for your baby right after birth. ? Medicines or tests that may be recommended for your baby. ? Whether breastfeeding is supported in your hospital or birth center. ? How long you will be in the hospital or birth center.  How any medical conditions you have may affect your baby or your labor and delivery experience.  To prepare for your baby's birth, you should also:  Attend all of your health care visits before delivery (prenatal visits) as recommended by your health care provider. This is important.  Prepare your home for your baby's  arrival. Make sure that you have: ? Diapers. ? Baby clothing. ? Feeding equipment. ? Safe sleeping arrangements for you and your baby.  Install a car seat in your vehicle. Have your car seat checked by a certified car seat installer to make sure that it is installed safely.  Think about who will help you with your new baby at home for at least the first several weeks after delivery.  What can I expect when I arrive at the birth center or hospital? Once you are in labor and have been admitted into the hospital or birth center, your health care provider may:  Review your pregnancy history and any concerns you have.  Insert an IV tube into one of your veins. This is used to give you fluids and medicines.  Check your blood pressure, pulse, temperature, and heart rate (vital signs).  Check whether your bag of water (amniotic sac) has broken (ruptured).  Talk with you about your birth plan and discuss pain control options.  Monitoring Your health care provider may monitor your contractions (uterine monitoring) and your baby's heart rate (fetal monitoring). You may need to be monitored:  Often, but not continuously (intermittently).  All the time or for long periods at a time (continuously). Continuous monitoring may be needed if: ? You are taking certain medicines, such as medicine to relieve pain or make your contractions stronger. ? You have pregnancy or labor complications.  Monitoring may be done by:  Placing a special stethoscope or a handheld monitoring device on your abdomen to   check your baby's heartbeat, and feeling your abdomen for contractions. This method of monitoring does not continuously record your baby's heartbeat or your contractions.  Placing monitors on your abdomen (external monitors) to record your baby's heartbeat and the frequency and length of contractions. You may not have to wear external monitors all the time.  Placing monitors inside of your uterus  (internal monitors) to record your baby's heartbeat and the frequency, length, and strength of your contractions. ? Your health care provider may use internal monitors if he or she needs more information about the strength of your contractions or your baby's heart rate. ? Internal monitors are put in place by passing a thin, flexible wire through your vagina and into your uterus. Depending on the type of monitor, it may remain in your uterus or on your baby's head until birth. ? Your health care provider will discuss the benefits and risks of internal monitoring with you and will ask for your permission before inserting the monitors.  Telemetry. This is a type of continuous monitoring that can be done with external or internal monitors. Instead of having to stay in bed, you are able to move around during telemetry. Ask your health care provider if telemetry is an option for you.  Physical exam Your health care provider may perform a physical exam. This may include:  Checking whether your baby is positioned: ? With the head toward your vagina (head-down). This is most common. ? With the head toward the top of your uterus (head-up or breech). If your baby is in a breech position, your health care provider may try to turn your baby to a head-down position so you can deliver vaginally. If it does not seem that your baby can be born vaginally, your provider may recommend surgery to deliver your baby. In rare cases, you may be able to deliver vaginally if your baby is head-up (breech delivery). ? Lying sideways (transverse). Babies that are lying sideways cannot be delivered vaginally.  Checking your cervix to determine: ? Whether it is thinning out (effacing). ? Whether it is opening up (dilating). ? How low your baby has moved into your birth canal.  What are the three stages of labor and delivery?  Normal labor and delivery is divided into the following three stages: Stage 1  Stage 1 is the  longest stage of labor, and it can last for hours or days. Stage 1 includes: ? Early labor. This is when contractions may be irregular, or regular and mild. Generally, early labor contractions are more than 10 minutes apart. ? Active labor. This is when contractions get longer, more regular, more frequent, and more intense. ? The transition phase. This is when contractions happen very close together, are very intense, and may last longer than during any other part of labor.  Contractions generally feel mild, infrequent, and irregular at first. They get stronger, more frequent (about every 2-3 minutes), and more regular as you progress from early labor through active labor and transition.  Many women progress through stage 1 naturally, but you may need help to continue making progress. If this happens, your health care provider may talk with you about: ? Rupturing your amniotic sac if it has not ruptured yet. ? Giving you medicine to help make your contractions stronger and more frequent.  Stage 1 ends when your cervix is completely dilated to 4 inches (10 cm) and completely effaced. This happens at the end of the transition phase. Stage 2  Once   your cervix is completely effaced and dilated to 4 inches (10 cm), you may start to feel an urge to push. It is common for the body to naturally take a rest before feeling the urge to push, especially if you received an epidural or certain other pain medicines. This rest period may last for up to 1-2 hours, depending on your unique labor experience.  During stage 2, contractions are generally less painful, because pushing helps relieve contraction pain. Instead of contraction pain, you may feel stretching and burning pain, especially when the widest part of your baby's head passes through the vaginal opening (crowning).  Your health care provider will closely monitor your pushing progress and your baby's progress through the vagina during stage 2.  Your  health care provider may massage the area of skin between your vaginal opening and anus (perineum) or apply warm compresses to your perineum. This helps it stretch as the baby's head starts to crown, which can help prevent perineal tearing. ? In some cases, an incision may be made in your perineum (episiotomy) to allow the baby to pass through the vaginal opening. An episiotomy helps to make the opening of the vagina larger to allow more room for the baby to fit through.  It is very important to breathe and focus so your health care provider can control the delivery of your baby's head. Your health care provider may have you decrease the intensity of your pushing, to help prevent perineal tearing.  After delivery of your baby's head, the shoulders and the rest of the body generally deliver very quickly and without difficulty.  Once your baby is delivered, the umbilical cord may be cut right away, or this may be delayed for 1-2 minutes, depending on your baby's health. This may vary among health care providers, hospitals, and birth centers.  If you and your baby are healthy enough, your baby may be placed on your chest or abdomen to help maintain the baby's temperature and to help you bond with each other. Some mothers and babies start breastfeeding at this time. Your health care team will dry your baby and help keep your baby warm during this time.  Your baby may need immediate care if he or she: ? Showed signs of distress during labor. ? Has a medical condition. ? Was born too early (prematurely). ? Had a bowel movement before birth (meconium). ? Shows signs of difficulty transitioning from being inside the uterus to being outside of the uterus. If you are planning to breastfeed, your health care team will help you begin a feeding. Stage 3  The third stage of labor starts immediately after the birth of your baby and ends after you deliver the placenta. The placenta is an organ that develops  during pregnancy to provide oxygen and nutrients to your baby in the womb.  Delivering the placenta may require some pushing, and you may have mild contractions. Breastfeeding can stimulate contractions to help you deliver the placenta.  After the placenta is delivered, your uterus should tighten (contract) and become firm. This helps to stop bleeding in your uterus. To help your uterus contract and to control bleeding, your health care provider may: ? Give you medicine by injection, through an IV tube, by mouth, or through your rectum (rectally). ? Massage your abdomen or perform a vaginal exam to remove any blood clots that are left in your uterus. ? Empty your bladder by placing a thin, flexible tube (catheter) into your bladder. ? Encourage   you to breastfeed your baby. After labor is over, you and your baby will be monitored closely to ensure that you are both healthy until you are ready to go home. Your health care team will teach you how to care for yourself and your baby. This information is not intended to replace advice given to you by your health care provider. Make sure you discuss any questions you have with your health care provider. Document Released: 10/30/2007 Document Revised: 08/10/2015 Document Reviewed: 02/04/2015 Elsevier Interactive Patient Education  2018 [employer]  

## 2017-04-24 LAB — STREP GP B NAA: STREP GROUP B AG: NEGATIVE

## 2017-04-25 LAB — CHLAMYDIA/GONOCOCCUS/TRICHOMONAS, NAA
CHLAMYDIA BY NAA: NEGATIVE
Gonococcus by NAA: NEGATIVE
Trich vag by NAA: NEGATIVE

## 2017-04-29 ENCOUNTER — Inpatient Hospital Stay: Payer: Commercial Managed Care - PPO | Admitting: Anesthesiology

## 2017-04-29 ENCOUNTER — Other Ambulatory Visit: Payer: Self-pay

## 2017-04-29 ENCOUNTER — Inpatient Hospital Stay
Admission: EM | Admit: 2017-04-29 | Discharge: 2017-04-30 | DRG: 807 | Disposition: A | Discharge status: Home / Self Care | Payer: Commercial Managed Care - PPO | Attending: Advanced Practice Midwife | Admitting: Advanced Practice Midwife

## 2017-04-29 ENCOUNTER — Encounter: Payer: Commercial Managed Care - PPO | Admitting: Maternal Newborn

## 2017-04-29 DIAGNOSIS — Z3A37 37 weeks gestation of pregnancy: Secondary | ICD-10-CM | POA: Diagnosis not present

## 2017-04-29 DIAGNOSIS — O4292 Full-term premature rupture of membranes, unspecified as to length of time between rupture and onset of labor: Secondary | ICD-10-CM | POA: Diagnosis present

## 2017-04-29 DIAGNOSIS — Z87891 Personal history of nicotine dependence: Secondary | ICD-10-CM

## 2017-04-29 DIAGNOSIS — O4290 Premature rupture of membranes, unspecified as to length of time between rupture and onset of labor, unspecified weeks of gestation: Secondary | ICD-10-CM | POA: Diagnosis present

## 2017-04-29 LAB — CBC
HEMATOCRIT: 27.5 % — AB (ref 35.0–47.0)
Hemoglobin: 9 g/dL — ABNORMAL LOW (ref 12.0–16.0)
MCH: 25.8 pg — ABNORMAL LOW (ref 26.0–34.0)
MCHC: 32.7 g/dL (ref 32.0–36.0)
MCV: 78.9 fL — AB (ref 80.0–100.0)
PLATELETS: 194 10*3/uL (ref 150–440)
RBC: 3.49 MIL/uL — ABNORMAL LOW (ref 3.80–5.20)
RDW: 16.5 % — ABNORMAL HIGH (ref 11.5–14.5)
WBC: 7.5 10*3/uL (ref 3.6–11.0)

## 2017-04-29 LAB — TYPE AND SCREEN
ABO/RH(D): A POS
Antibody Screen: NEGATIVE

## 2017-04-29 LAB — RAPID HIV SCREEN (HIV 1/2 AB+AG)
HIV 1/2 Antibodies: NONREACTIVE
HIV-1 P24 ANTIGEN - HIV24: NONREACTIVE

## 2017-04-29 MED ORDER — LIDOCAINE HCL (PF) 1 % IJ SOLN
INTRAMUSCULAR | Status: DC | PRN
  Administered 2017-04-29: 3 mL via EPIDURAL
  Administered 2017-04-29: 2 mL via SUBCUTANEOUS

## 2017-04-29 MED ORDER — EPHEDRINE 5 MG/ML INJ
10.0000 mg | INTRAVENOUS | Status: DC | PRN
  Filled 2017-04-29: qty 2

## 2017-04-29 MED ORDER — FENTANYL 2.5 MCG/ML W/ROPIVACAINE 0.15% IN NS 100 ML EPIDURAL (ARMC): 12.0000 mL/h | EPIDURAL | Status: DC

## 2017-04-29 MED ORDER — PHENYLEPHRINE 40 MCG/ML (10ML) SYRINGE FOR IV PUSH (FOR BLOOD PRESSURE SUPPORT)
80.0000 ug | PREFILLED_SYRINGE | INTRAVENOUS | Status: DC | PRN
  Filled 2017-04-29: qty 5

## 2017-04-29 MED ORDER — OXYTOCIN 40 UNITS IN LACTATED RINGERS INFUSION - SIMPLE MED: 2.5000 [IU]/h | INTRAVENOUS | Status: DC

## 2017-04-29 MED ORDER — WITCH HAZEL-GLYCERIN EX PADS: 1.0000 "application " | MEDICATED_PAD | CUTANEOUS | Status: DC | PRN

## 2017-04-29 MED ORDER — OXYTOCIN 40 UNITS IN LACTATED RINGERS INFUSION - SIMPLE MED
1.0000 m[IU]/min | INTRAVENOUS | Status: DC
  Administered 2017-04-29: 2 m[IU]/min via INTRAVENOUS
  Filled 2017-04-29: qty 1000

## 2017-04-29 MED ORDER — ONDANSETRON HCL 4 MG PO TABS: 4.0000 mg | ORAL_TABLET | ORAL | Status: DC | PRN

## 2017-04-29 MED ORDER — LACTATED RINGERS IV SOLN: 500.0000 mL | Freq: Once | INTRAVENOUS | Status: DC

## 2017-04-29 MED ORDER — ONDANSETRON HCL 4 MG/2ML IJ SOLN: 4.0000 mg | Freq: Four times a day (QID) | INTRAMUSCULAR | Status: DC | PRN

## 2017-04-29 MED ORDER — SIMETHICONE 80 MG PO CHEW: 80.0000 mg | CHEWABLE_TABLET | ORAL | Status: DC | PRN

## 2017-04-29 MED ORDER — LIDOCAINE HCL (PF) 1 % IJ SOLN
INTRAMUSCULAR | Status: AC
  Filled 2017-04-29: qty 30

## 2017-04-29 MED ORDER — DIPHENHYDRAMINE HCL 50 MG/ML IJ SOLN: 12.5000 mg | INTRAMUSCULAR | Status: DC | PRN

## 2017-04-29 MED ORDER — ACETAMINOPHEN 325 MG PO TABS: 650.0000 mg | ORAL_TABLET | ORAL | Status: DC | PRN

## 2017-04-29 MED ORDER — COCONUT OIL OIL: 1.0000 "application " | TOPICAL_OIL | Status: DC | PRN

## 2017-04-29 MED ORDER — LACTATED RINGERS IV SOLN
500.0000 mL | INTRAVENOUS | Status: DC | PRN
  Administered 2017-04-29 (×2): 500 mL via INTRAVENOUS

## 2017-04-29 MED ORDER — SODIUM CHLORIDE 0.9 % IV SOLN
INTRAVENOUS | Status: DC | PRN
  Administered 2017-04-29 (×2): 5 mL via EPIDURAL

## 2017-04-29 MED ORDER — IBUPROFEN 600 MG PO TABS
600.0000 mg | ORAL_TABLET | Freq: Four times a day (QID) | ORAL | Status: DC
  Administered 2017-04-29 – 2017-04-30 (×4): 600 mg via ORAL
  Filled 2017-04-29 (×4): qty 1

## 2017-04-29 MED ORDER — DIBUCAINE 1 % RE OINT: 1.0000 "application " | TOPICAL_OINTMENT | RECTAL | Status: DC | PRN

## 2017-04-29 MED ORDER — FENTANYL 2.5 MCG/ML W/ROPIVACAINE 0.15% IN NS 100 ML EPIDURAL (ARMC)
EPIDURAL | Status: DC | PRN
  Administered 2017-04-29: 12 mL/h via EPIDURAL

## 2017-04-29 MED ORDER — OXYTOCIN 10 UNIT/ML IJ SOLN
INTRAMUSCULAR | Status: AC
  Filled 2017-04-29: qty 2

## 2017-04-29 MED ORDER — SENNOSIDES-DOCUSATE SODIUM 8.6-50 MG PO TABS
2.0000 | ORAL_TABLET | ORAL | Status: DC
  Administered 2017-04-29: 2 via ORAL
  Filled 2017-04-29: qty 2

## 2017-04-29 MED ORDER — SOD CITRATE-CITRIC ACID 500-334 MG/5ML PO SOLN: 30.0000 mL | ORAL | Status: DC | PRN

## 2017-04-29 MED ORDER — FENTANYL 2.5 MCG/ML W/ROPIVACAINE 0.15% IN NS 100 ML EPIDURAL (ARMC)
EPIDURAL | Status: AC
  Filled 2017-04-29: qty 100

## 2017-04-29 MED ORDER — ONDANSETRON HCL 4 MG/2ML IJ SOLN: 4.0000 mg | INTRAMUSCULAR | Status: DC | PRN

## 2017-04-29 MED ORDER — TERBUTALINE SULFATE 1 MG/ML IJ SOLN: 0.2500 mg | Freq: Once | INTRAMUSCULAR | Status: DC | PRN

## 2017-04-29 MED ORDER — LACTATED RINGERS IV SOLN: INTRAVENOUS | Status: DC

## 2017-04-29 MED ORDER — SODIUM CHLORIDE 0.9 % IJ SOLN
INTRAMUSCULAR | Status: AC
  Filled 2017-04-29: qty 50

## 2017-04-29 MED ORDER — MISOPROSTOL 200 MCG PO TABS
ORAL_TABLET | ORAL | Status: AC
  Filled 2017-04-29: qty 4

## 2017-04-29 MED ORDER — BENZOCAINE-MENTHOL 20-0.5 % EX AERO
1.0000 "application " | INHALATION_SPRAY | CUTANEOUS | Status: DC | PRN
  Administered 2017-04-29: 1 via TOPICAL
  Filled 2017-04-29: qty 56

## 2017-04-29 MED ORDER — AMMONIA AROMATIC IN INHA
RESPIRATORY_TRACT | Status: AC
  Filled 2017-04-29: qty 10

## 2017-04-29 MED ORDER — DIPHENHYDRAMINE HCL 25 MG PO CAPS: 25.0000 mg | ORAL_CAPSULE | Freq: Four times a day (QID) | ORAL | Status: DC | PRN

## 2017-04-29 MED ORDER — OXYTOCIN BOLUS FROM INFUSION
500.0000 mL | Freq: Once | INTRAVENOUS | Status: AC
  Administered 2017-04-29: 500 mL via INTRAVENOUS

## 2017-04-29 MED ORDER — LIDOCAINE HCL (PF) 1 % IJ SOLN: 30.0000 mL | INTRAMUSCULAR | Status: DC | PRN

## 2017-04-29 MED ORDER — PRENATAL MULTIVITAMIN CH
1.0000 | ORAL_TABLET | Freq: Every day | ORAL | Status: DC
  Administered 2017-04-30: 1 via ORAL
  Filled 2017-04-29: qty 1

## 2017-04-29 NOTE — Discharge Summary (Signed)
OB Discharge Summary     Patient Name: Valerie Gardner DOB: 11/22/1990 MRN: [id number]  Date of admission: 04/29/2017 Delivering provider: Rod Can, CNM  Date of Delivery: 04/29/2017  Date of discharge: 04/30/2017  Admitting diagnosis: premature spontaneous rupture of membranes Intrauterine pregnancy: [redacted]w[redacted]d     Secondary diagnosis: None     Discharge diagnosis: Term Pregnancy Delivered                                                                                                Post partum procedures:none  Augmentation: Pitocin and Foley Balloon  Complications: None  Hospital course:  Induction of Labor With Vaginal Delivery   27 y.o. yo G2P2002 at [redacted]w[redacted]d was admitted to the hospital 04/29/2017 for induction of labor.   Indication for induction: PROM.  Patient had an uncomplicated labor course as follows: Membrane Rupture Time/Date: 7:30 PM ,04/28/2017   Patient had delivery of viable female 11:49, 04/29/2017  Details of delivery can be found in separate delivery note.    Patient had a routine postpartum course. She is ambulating and voiding without difficulty.  She is tolerating PO intake and her pain is well controlled with PO pain medicine.   Patient is discharged home 04/30/2017.  Physical exam  Vitals:   04/29/17 1618 04/29/17 1915 04/29/17 2300 04/30/17 0816  BP: 106/63 109/69 (!) 108/54 104/66  Pulse: 66 75 68 63  Resp: 18 18 18 17   Temp: 98.3 F (36.8 C) 97.8 F (36.6 C) 98.2 F (36.8 C) 97.8 F (36.6 C)  TempSrc:  Oral Oral Oral  SpO2: 99% 99% 100% 100%  Weight:      Height:       General: alert, cooperative and no distress Lochia: appropriate Uterine Fundus: firm/ U/ ML/NT Incision: N/A DVT Evaluation: No evidence of DVT seen on physical exam.  Labs: Lab Results  Component Value Date   WBC 10.7 04/30/2017   HGB 9.2 (L) 04/30/2017   HCT 27.9 (L) 04/30/2017   MCV 79.6 (L) 04/30/2017   PLT 187 04/30/2017    Discharge instruction: per After  Visit Summary.  Medications:  Allergies as of 04/30/2017   No Known Allergies     Medication List    STOP taking these medications   terconazole 0.8 % vaginal cream Commonly known as:  TERAZOL 3     TAKE these medications   CITRANATAL BLOOM 90-1 MG Tabs Take 1 tablet by mouth daily.   ibuprofen 600 MG tablet Commonly known as:  ADVIL,MOTRIN Take 1 tablet (600 mg total) by mouth every 6 (six) hours as needed for mild pain, moderate pain or cramping.       Diet: routine diet  Activity: Advance as tolerated. Pelvic rest for 6 weeks.   Outpatient follow up: Follow-up Information    Rod Can, CNM. Schedule an appointment as soon as possible for a visit in 6 week(s).   Specialty:  Obstetrics Why:  postpartum follow up appointment Contact information: [address] [place] [address] [phone]             Postpartum contraception: condoms Rhogam Given  postpartum: NA Rubella vaccine given postpartum: Rubella Immune Varicella vaccine given postpartum: Varicella Immune TDaP given antepartum or postpartum: given antepartum  Newborn Data: Live born female Hoyt Koch Birth Weight: 6 lb 11.6 oz (3050 g) APGAR: 8, 9  Newborn Delivery   Birth date/time:  04/29/2017 11:49:00 Delivery type:  Vaginal, Spontaneous     Baby Feeding: Bottle  Disposition:home with mother  SIGNED:  Dalia Heading, CNM 04/30/2017 8:53 AM

## 2017-04-29 NOTE — Progress Notes (Signed)
  Labor Progress Note   27 y.o. G2P1001 @ [redacted]w[redacted]d , admitted for  Pregnancy, Labor Management. PROM  Subjective:  Patient is comfortable with epidural. She began to feel some cramping but pressed epidural PCA for relief.  Objective:  BP 117/62 (BP Location: Right Arm)   Pulse 70   Temp 98.5 F (36.9 C) (Axillary)   Resp 18   Ht 5\' 6"  (1.676 m)   Wt 158 lb (71.7 kg)   LMP 08/12/2016 (Exact Date)   SpO2 97%   BMI 25.50 kg/m  Abd: mild Extr: trace edema SVE: CERVIX: 7.5 cm dilated, 80 effaced, -2 station  EFM: FHR: 130 bpm, variability: moderate,  accelerations:  Present,  decelerations:  Present some variables, earlies, lates with good recovery and good variability Toco: Frequency: Every 2-4 minutes Labs: I have reviewed the patient's lab results.   Assessment & Plan:  G2P1001 @ [redacted]w[redacted]d, admitted for  Pregnancy and Labor/Delivery Management  1. Pain management: epidural. 2. FWB: FHT category II and overall reassuring.  3. ID: GBS negative 4. Labor management: Pitocin augmentation, expectant for SVD  All discussed with patient, see orders   Rod Can, [facility] 04/29/2017  10:53 AM

## 2017-04-29 NOTE — Anesthesia Procedure Notes (Signed)
Epidural Patient location during procedure: OB Start time: 04/29/2017 7:04 AM End time: 04/29/2017 7:16 AM  Staffing Anesthesiologist: Martha Clan, MD Performed: anesthesiologist   Preanesthetic Checklist Completed: patient identified, site marked, surgical consent, pre-op evaluation, timeout performed, IV checked, risks and benefits discussed and monitors and equipment checked  Epidural Patient position: sitting Prep: ChloraPrep Patient monitoring: heart rate, continuous pulse ox and blood pressure Approach: midline Location: L4-L5 Injection technique: LOR saline  Needle:  Needle type: Tuohy  Needle gauge: 17 G Needle length: 9 cm and 9 Needle insertion depth: 4.5 cm Catheter type: closed end flexible Catheter size: 19 Gauge Catheter at skin depth: 9 cm Test dose: negative and 1.5% lidocaine with Epi 1:200 K  Assessment Sensory level: T10 Events: blood not aspirated, injection not painful, no injection resistance, negative IV test and no paresthesia  Additional Notes Pt. Evaluated and documentation done after procedure finished. Patient identified. Risks/Benefits/Options discussed with patient including but not limited to bleeding, infection, nerve damage, paralysis, failed block, incomplete pain control, headache, blood pressure changes, nausea, vomiting, reactions to medication both or allergic, itching and postpartum back pain. Confirmed with bedside nurse the patient's most recent platelet count. Confirmed with patient that they are not currently taking any anticoagulation, have any bleeding history or any family history of bleeding disorders. Patient expressed understanding and wished to proceed. All questions were answered. Sterile technique was used throughout the entire procedure. Please see nursing notes for vital signs. Test dose was given through epidural catheter and negative prior to continuing to dose epidural or start infusion. Warning signs of high block given to  the patient including shortness of breath, tingling/numbness in hands, complete motor block, or any concerning symptoms with instructions to call for help. Patient was given instructions on fall risk and not to get out of bed. All questions and concerns addressed with instructions to call with any issues or inadequate analgesia.   Patient tolerated the insertion well without immediate complications.Reason for block:procedure for pain

## 2017-04-29 NOTE — OB Triage Note (Signed)
Pt presented to ED with complaints of leaking fluids. States that 3 large pads were saturated. Started leaking fluid at about 1930 last night. Patient states that she is not having any ctx. Monitors applied and assessing. Patient denies any bleeding or discharge. Denies headaches, blurred vision, epigastric pain, or urinary incontinence. Nitrozine positive.

## 2017-04-29 NOTE — H&P (Signed)
History and Physical  Valerie Gardner is an 27 y.o. female.  HPI: Patient presented from home with complaints of rupture of membranes. Patient reported that she started leaking fluid at 7:30 PM. She reports it was initially a trickle then she had a large gush of fluid. She has since filled 4 pads with clear fluid. She feels very mild contractions. She reports good fetal movement. She denies fever. Denies vaginal bleeding. Pregnancy thus far has been uncomplicated.    OB History  Gravida Para Term Preterm AB Living  2 1 1     1   SAB TAB Ectopic Multiple Live Births        0 1    # Outcome Date GA Lbr Len/2nd Weight Sex Delivery Anes PTL Lv  2 Current           1 Term 06/29/15 [redacted]w[redacted]d / 02:54 7 lb 5.5 oz (3.33 kg) M Vag-Spont EPI  LIV     Past Medical History:  Diagnosis Date  . [redacted] weeks gestation of pregnancy 04/02/2017  . PCOS (polycystic ovarian syndrome)     Past Surgical History:  Procedure Laterality Date  . NO PAST SURGERIES      Family History  Problem Relation Age of Onset  . Diabetes Mellitus I Sister   . Melanoma Maternal Grandmother   . Diabetes Mellitus II Paternal Grandmother   . COPD Paternal Grandmother   . Hypertension Mother   . Hypertension Father     Social History:  reports that she has quit smoking. She has never used smokeless tobacco. She reports that she does not drink alcohol or use drugs.  Allergies: No Known Allergies  Medications: I have reviewed the patient's current medications.  No results found for this or any previous visit (from the past 48 hour(s)).  No results found.  Maternal Diabetes: No, 1GTT 75 Genetic Screening: Normal Maternal Ultrasounds/Referrals: Normal Fetal Ultrasounds or other Referrals:  None Maternal Substance Abuse:  No Significant Maternal Medications:  None Significant Maternal Lab Results:  None Other Comments:  None  History Dilation: 2 Effacement (%): 60 Station: -3 Exam by:: S. Doldron  RN Temperature 97.8 F (36.6 C), temperature source Oral, height 5\' 6"  (1.676 m), weight 158 lb (71.7 kg), last menstrual period 08/12/2016, unknown if currently breastfeeding. Exam Physical Exam  Prenatal labs: ABO, Rh: A/Positive/-- (08/30 0851) Antibody: Negative (08/30 0851) Rubella: 3.52 (08/30 0851) RPR: Non Reactive (01/17 0922)  HBsAg: Negative (08/30 0851)  HIV: Non Reactive (01/17 5732)  GBS: Negative (03/20 0856)   Review of Systems  Constitutional: Negative for chills, fever, malaise/fatigue and weight loss.  HENT: Negative for congestion, hearing loss and sinus pain.   Eyes: Negative for blurred vision and double vision.  Respiratory: Negative for cough, sputum production, shortness of breath and wheezing.   Cardiovascular: Negative for chest pain, palpitations, orthopnea and leg swelling.  Gastrointestinal: Negative for abdominal pain, constipation, diarrhea, nausea and vomiting.  Genitourinary: Negative for dysuria, flank pain, frequency, hematuria and urgency.  Musculoskeletal: Negative for back pain, falls and joint pain.  Skin: Negative for itching and rash.  Neurological: Negative for dizziness and headaches.  Psychiatric/Behavioral: Negative for depression, substance abuse and suicidal ideas. The patient is not nervous/anxious.    Blood pressure 115/68, pulse 77, temperature 97.8 F (36.6 C), temperature source Oral, height 5\' 6"  (1.676 m), weight 158 lb (71.7 kg), last menstrual period 08/12/2016, unknown if currently breastfeeding. Physical Exam  Nursing note and vitals reviewed. Constitutional: She is oriented  to person, place, and time. She appears well-developed and well-nourished.  HENT:  Head: Normocephalic and atraumatic.  Cardiovascular: Normal rate and regular rhythm.  Respiratory: Effort normal and breath sounds normal.  GI: Soft. Bowel sounds are normal.  Musculoskeletal: Normal range of motion.  Neurological: She is alert and oriented to person,  place, and time.  Skin: Skin is warm and dry.  Psychiatric: She has a normal mood and affect. Her behavior is normal. Judgment and thought content normal.   SVE: 1-2cm/20%/-3 Clear fluid at introitus, Nitrazine positive. Ferning seen on slide.   Assessment/Plan: 26yo [id number] at 37 weeks 1 day gestational age. With premature rupture of membranes 1. PROM- will induce patient with foley bulb and pitocin 2. GBS negative 3. Epidural when desires 4. Clear liquid diet.  Valerie Gardner R Valerie Gardner 04/29/2017, 4:09 AM

## 2017-04-29 NOTE — Progress Notes (Signed)
Foley bulb placed through cervical os, copious clear amniotic fluid with placement. Inflated with 30cc of fluid and put on tension.  FHR: 120s, moderate variability, no declerations Toco: contractions every 2-5 minutes  Discussed pain management option with patient such as Nitrous gas, IV pain medicaition, volunteer doula program, and epidural  Adrian Prows MD [place] 04/29/17 4:24 AM

## 2017-04-29 NOTE — Anesthesia Preprocedure Evaluation (Signed)
Anesthesia Evaluation  Patient identified by MRN, date of birth, ID band Patient awake    Reviewed: Allergy & Precautions, NPO status , Patient's Chart, lab work & pertinent test results  History of Anesthesia Complications Negative for: history of anesthetic complications  Airway Mallampati: I       Dental  (+) Teeth Intact   Pulmonary former smoker,           Cardiovascular negative cardio ROS       Neuro/Psych negative neurological ROS  negative psych ROS   GI/Hepatic negative GI ROS, Neg liver ROS,   Endo/Other  negative endocrine ROS  Renal/GU negative Renal ROS  negative genitourinary   Musculoskeletal   Abdominal   Peds negative pediatric ROS (+)  Hematology negative hematology ROS (+)   Anesthesia Other Findings Past Medical History: 04/02/2017: [redacted] weeks gestation of pregnancy No date: PCOS (polycystic ovarian syndrome)   Reproductive/Obstetrics (+) Pregnancy                             Anesthesia Physical  Anesthesia Plan  ASA: II  Anesthesia Plan: Epidural   Post-op Pain Management:    Induction:   PONV Risk Score and Plan:   Airway Management Planned:   Additional Equipment:   Intra-op Plan:   Post-operative Plan:   Informed Consent: I have reviewed the patients History and Physical, chart, labs and discussed the procedure including the risks, benefits and alternatives for the proposed anesthesia with the patient or authorized representative who has indicated his/her understanding and acceptance.     Plan Discussed with:   Anesthesia Plan Comments:         Anesthesia Quick Evaluation

## 2017-04-30 LAB — CBC
HCT: 27.9 % — ABNORMAL LOW (ref 35.0–47.0)
HEMOGLOBIN: 9.2 g/dL — AB (ref 12.0–16.0)
MCH: 26.1 pg (ref 26.0–34.0)
MCHC: 32.8 g/dL (ref 32.0–36.0)
MCV: 79.6 fL — AB (ref 80.0–100.0)
PLATELETS: 187 10*3/uL (ref 150–440)
RBC: 3.51 MIL/uL — AB (ref 3.80–5.20)
RDW: 16.5 % — ABNORMAL HIGH (ref 11.5–14.5)
WBC: 10.7 10*3/uL (ref 3.6–11.0)

## 2017-04-30 LAB — RPR: RPR: NONREACTIVE

## 2017-04-30 MED ORDER — CITRANATAL BLOOM 90-1 MG PO TABS: 1.0000 | ORAL_TABLET | Freq: Every day | ORAL | 2 refills | Status: DC

## 2017-04-30 MED ORDER — IBUPROFEN 600 MG PO TABS: 600.0000 mg | ORAL_TABLET | Freq: Four times a day (QID) | ORAL | 1 refills | Status: DC | PRN

## 2017-04-30 NOTE — Progress Notes (Addendum)
Post Partum Day 1 Subjective: no complaints, up ad lib, voiding, tolerating PO and desires to be discharged.  Objective: Blood pressure 104/66, pulse 63, temperature 97.8 F (36.6 C), temperature source Oral, resp. rate 17, height 5\' 6"  (1.676 m), weight 71.7 kg (158 lb), last menstrual period 08/12/2016, SpO2 100 %, unknown if currently breastfeeding.  Physical Exam:  General: alert, cooperative and no distress Lochia: appropriate Uterine Fundus: firm/at U/ML/NT Incision: NA DVT Evaluation: No evidence of DVT seen on physical exam.  Recent Labs    04/29/17 0323 04/30/17 0612  HGB 9.0* 9.2*  HCT 27.5* 27.9*  WBC 7.5 10.7  PLT 194 187    Assessment/Plan: Stable PPD #1/ Anemia Discharge home today A POS/ RI/ VI/ GBS neg Bottle Condoms TDAP UTD Home on vitamin with iron Discussed foods high in iron.   LOS: 1 day   Dalia Heading 04/30/2017, 8:46 AM

## 2017-04-30 NOTE — Anesthesia Postprocedure Evaluation (Signed)
Anesthesia Post Note  Patient: Valerie Gardner  Procedure(s) Performed: AN AD [place]  Patient location during evaluation: Mother Baby Anesthesia Type: Epidural Level of consciousness: awake and alert Pain management: pain level controlled Vital Signs Assessment: post-procedure vital signs reviewed and stable Respiratory status: spontaneous breathing, nonlabored ventilation and respiratory function stable Cardiovascular status: stable Postop Assessment: no headache, no backache and epidural receding Anesthetic complications: no     Last Vitals:  Vitals:   04/29/17 1915 04/29/17 2300  BP: 109/69 (!) 108/54  Pulse: 75 68  Resp: 18 18  Temp: 36.6 C 36.8 C  SpO2: 99% 100%    Last Pain:  Vitals:   04/29/17 2300  TempSrc: Oral  PainSc: 0-No pain                 Alison Stalling

## 2017-04-30 NOTE — Progress Notes (Signed)
Patient discharged home with infant and significant other. Discharge instructions, prescriptions and follow up appointment given to and reviewed with patient and significant other. Patient verbalized understanding. Escorted out via wheelchair by auxiliary.  

## 2017-06-10 ENCOUNTER — Ambulatory Visit (INDEPENDENT_AMBULATORY_CARE_PROVIDER_SITE_OTHER): Payer: Commercial Managed Care - PPO | Admitting: Advanced Practice Midwife

## 2017-06-10 ENCOUNTER — Encounter: Payer: Self-pay | Admitting: Advanced Practice Midwife

## 2017-06-10 NOTE — Progress Notes (Signed)
Postpartum Visit  Chief Complaint:  Chief Complaint  Patient presents with  . 6 week post partum    History of Present Illness: Patient is a 27 y.o. [id number] presents for postpartum visit.  Review the Delivery Report for details.  Date of delivery: 04/29/2017 Type of delivery: Vaginal delivery - Vacuum or forceps assisted  no Episiotomy No.  Laceration: small periurethral/hemostatic/no repair  Pregnancy or labor problems:  no Any problems since the delivery:  no  Newborn Details:  SINGLETON :  1. BabyGender female. Birth weight: 6 pounds 12 ounces Maternal Details:  Breast or formula feeding: Formula feeding  Contraception after delivery: plans condoms Any bowel or bladder issues: No  Post partum depression/anxiety noted:  no Edinburgh Post-Partum Depression Score: 1 Date of last PAP: 10/02/2016  no abnormalities   Review of Systems: Review of Systems  Constitutional: Negative.   HENT: Negative.   Eyes: Negative.   Respiratory: Negative.   Cardiovascular: Negative.   Gastrointestinal: Negative.   Genitourinary: Negative.   Musculoskeletal: Negative.   Skin: Negative.   Neurological: Negative.   Endo/Heme/Allergies: Negative.   Psychiatric/Behavioral: Negative.       Past Medical History:  Past Medical History:  Diagnosis Date  . [redacted] weeks gestation of pregnancy 04/02/2017  . PCOS (polycystic ovarian syndrome)     Past Surgical History:  Past Surgical History:  Procedure Laterality Date  . NO PAST SURGERIES      Family History:  Family History  Problem Relation Age of Onset  . Diabetes Mellitus I Sister   . Melanoma Maternal Grandmother   . Diabetes Mellitus II Paternal Grandmother   . COPD Paternal Grandmother   . Hypertension Mother   . Hypertension Father     Social History:  Social History   Socioeconomic History  . Marital status: Married    Spouse name: Not on file  . Number of children: Not on file  . Years of education: Not on file    . Highest education level: Not on file  Occupational History  . Not on file  Social Needs  . Financial resource strain: Not on file  . Food insecurity:    Worry: Not on file    Inability: Not on file  . Transportation needs:    Medical: Not on file    Non-medical: Not on file  Tobacco Use  . Smoking status: Former Research scientist (life sciences)  . Smokeless tobacco: Never Used  Substance and Sexual Activity  . Alcohol use: No  . Drug use: No  . Sexual activity: Yes    Birth control/protection: None  Lifestyle  . Physical activity:    Days per week: Not on file    Minutes per session: Not on file  . Stress: Not on file  Relationships  . Social connections:    Talks on phone: Not on file    Gets together: Not on file    Attends religious service: Not on file    Active member of club or organization: Not on file    Attends meetings of clubs or organizations: Not on file    Relationship status: Not on file  . Intimate partner violence:    Fear of current or ex partner: Not on file    Emotionally abused: Not on file    Physically abused: Not on file    Forced sexual activity: Not on file  Other Topics Concern  . Not on file  Social History Narrative  . Not on file  Allergies:  No Known Allergies  Medications: Prior to Admission medications   Medication Sig Start Date End Date Taking? Authorizing Provider  ibuprofen (ADVIL,MOTRIN) 600 MG tablet Take 1 tablet (600 mg total) by mouth every 6 (six) hours as needed for mild pain, moderate pain or cramping. Patient not taking: Reported on 06/10/2017 04/30/17   Dalia Heading, CNM  Prenatal-DSS-FeCb-FeGl-FA (CITRANATAL BLOOM) 90-1 MG TABS Take 1 tablet by mouth daily. Patient not taking: Reported on 06/10/2017 04/30/17   Dalia Heading, CNM    Physical Exam Blood pressure 118/74, height 5\' 6"  (1.676 m), weight 141 lb (64 kg)    General: NAD HEENT: normocephalic, anicteric Pulmonary: No increased work of breathing Abdomen: NABS, soft,  non-tender, non-distended.  Umbilicus without lesions.  No hepatomegaly, splenomegaly or masses palpable. No evidence of hernia. Genitourinary:  External: Normal external female genitalia.  Normal urethral meatus, normal  Bartholin's and Skene's glands.    Vagina: Normal vaginal mucosa, no evidence of prolapse.    Cervix: Grossly normal in appearance, no bleeding, no CMT  Uterus: Non-enlarged, mobile, normal contour.    Adnexa: ovaries non-enlarged, no adnexal masses  Rectal: deferred Extremities: no edema, erythema, or tenderness Neurologic: Grossly intact Psychiatric: mood appropriate, affect full    Assessment: 27 y.o. [id number] presenting for 6 week postpartum visit  Plan: Problem List Items Addressed This Visit    None    Visit Diagnoses    6 weeks postpartum follow-up    -  Primary       1) Contraception - Education given regarding options for contraception, as well as compatibility with breast feeding if applicable.  Patient plans on condoms for contraception.  2)  Pap - ASCCP guidelines and rational discussed.  ASCCP guidelines and rational discussed.  Patient opts for every 3 years screening interval  3) Patient underwent screening for postpartum depression with no signs of depression  4) Return in about 1 year (around 06/11/2018) for annual established gyn.   Rod Can, CNM [facility], [facility] 06/10/2017, 9:52 AM

## 2017-08-12 ENCOUNTER — Other Ambulatory Visit (HOSPITAL_COMMUNITY)
Admission: RE | Admit: 2017-08-12 | Discharge: 2017-08-12 | Disposition: A | Discharge status: Home / Self Care | Payer: Commercial Managed Care - PPO | Source: Ambulatory Visit | Attending: Maternal Newborn | Admitting: Maternal Newborn

## 2017-08-12 ENCOUNTER — Ambulatory Visit (INDEPENDENT_AMBULATORY_CARE_PROVIDER_SITE_OTHER): Payer: Commercial Managed Care - PPO | Admitting: Maternal Newborn

## 2017-08-12 ENCOUNTER — Encounter: Payer: Self-pay | Admitting: Maternal Newborn

## 2017-08-12 VITALS — BP 100/60 | Wt 136.0 lb

## 2017-08-12 DIAGNOSIS — O26899 Other specified pregnancy related conditions, unspecified trimester: Secondary | ICD-10-CM | POA: Insufficient documentation

## 2017-08-12 DIAGNOSIS — Z0189 Encounter for other specified special examinations: Secondary | ICD-10-CM

## 2017-08-12 DIAGNOSIS — O09891 Supervision of other high risk pregnancies, first trimester: Secondary | ICD-10-CM | POA: Diagnosis present

## 2017-08-12 DIAGNOSIS — O9989 Other specified diseases and conditions complicating pregnancy, childbirth and the puerperium: Secondary | ICD-10-CM

## 2017-08-12 DIAGNOSIS — R197 Diarrhea, unspecified: Secondary | ICD-10-CM

## 2017-08-12 DIAGNOSIS — Z3A01 Less than 8 weeks gestation of pregnancy: Secondary | ICD-10-CM

## 2017-08-12 DIAGNOSIS — R110 Nausea: Secondary | ICD-10-CM | POA: Diagnosis not present

## 2017-08-12 DIAGNOSIS — R51 Headache: Secondary | ICD-10-CM

## 2017-08-12 DIAGNOSIS — Z87891 Personal history of nicotine dependence: Secondary | ICD-10-CM | POA: Insufficient documentation

## 2017-08-12 DIAGNOSIS — Z113 Encounter for screening for infections with a predominantly sexual mode of transmission: Secondary | ICD-10-CM

## 2017-08-12 DIAGNOSIS — Z3480 Encounter for supervision of other normal pregnancy, unspecified trimester: Secondary | ICD-10-CM | POA: Insufficient documentation

## 2017-08-12 DIAGNOSIS — O26891 Other specified pregnancy related conditions, first trimester: Secondary | ICD-10-CM | POA: Diagnosis not present

## 2017-08-12 MED ORDER — DOXYLAMINE-PYRIDOXINE ER 20-20 MG PO TBCR: 1.0000 | EXTENDED_RELEASE_TABLET | Freq: Every day | ORAL | 2 refills | Status: DC

## 2017-08-12 NOTE — Patient Instructions (Signed)
First Trimester of Pregnancy The first trimester of pregnancy is from week 1 until the end of week 13 (months 1 through 3). A week after a sperm fertilizes an egg, the egg will implant on the wall of the uterus. This embryo will begin to develop into a baby. Genes from you and your partner will form the baby. The female genes will determine whether the baby will be a boy or a girl. At 6-8 weeks, the eyes and face will be formed, and the heartbeat can be seen on ultrasound. At the end of 12 weeks, all the baby's organs will be formed. Now that you are pregnant, you will want to do everything you can to have a healthy baby. Two of the most important things are to get good prenatal care and to follow your health care provider's instructions. Prenatal care is all the medical care you receive before the baby's birth. This care will help prevent, find, and treat any problems during the pregnancy and childbirth. Body changes during your first trimester Your body goes through many changes during pregnancy. The changes vary from woman to woman.  You may gain or lose a couple of pounds at first.  You may feel sick to your stomach (nauseous) and you may throw up (vomit). If the vomiting is uncontrollable, call your health care provider.  You may tire easily.  You may develop headaches that can be relieved by medicines. All medicines should be approved by your health care provider.  You may urinate more often. Painful urination may mean you have a bladder infection.  You may develop heartburn as a result of your pregnancy.  You may develop constipation because certain hormones are causing the muscles that push stool through your intestines to slow down.  You may develop hemorrhoids or swollen veins (varicose veins).  Your breasts may begin to grow larger and become tender. Your nipples may stick out more, and the tissue that surrounds them (areola) may become darker.  Your gums may bleed and may be  sensitive to brushing and flossing.  Dark spots or blotches (chloasma, mask of pregnancy) may develop on your face. This will likely fade after the baby is born.  Your menstrual periods will stop.  You may have a loss of appetite.  You may develop cravings for certain kinds of food.  You may have changes in your emotions from day to day, such as being excited to be pregnant or being concerned that something may go wrong with the pregnancy and baby.  You may have more vivid and strange dreams.  You may have changes in your hair. These can include thickening of your hair, rapid growth, and changes in texture. Some women also have hair loss during or after pregnancy, or hair that feels dry or thin. Your hair will most likely return to normal after your baby is born.  What to expect at prenatal visits During a routine prenatal visit:  You will be weighed to make sure you and the baby are growing normally.  Your blood pressure will be taken.  Your abdomen will be measured to track your baby's growth.  The fetal heartbeat will be listened to between weeks 10 and 14 of your pregnancy.  Test results from any previous visits will be discussed.  Your health care provider may ask you:  How you are feeling.  If you are feeling the baby move.  If you have had any abnormal symptoms, such as leaking fluid, bleeding, severe headaches,   or abdominal cramping.  If you are using any tobacco products, including cigarettes, chewing tobacco, and electronic cigarettes.  If you have any questions.  Other tests that may be performed during your first trimester include:  Blood tests to find your blood type and to check for the presence of any previous infections. The tests will also be used to check for low iron levels (anemia) and protein on red blood cells (Rh antibodies). Depending on your risk factors, or if you previously had diabetes during pregnancy, you may have tests to check for high blood  sugar that affects pregnant women (gestational diabetes).  Urine tests to check for infections, diabetes, or protein in the urine.  An ultrasound to confirm the proper growth and development of the baby.  Fetal screens for spinal cord problems (spina bifida) and Down syndrome.  HIV (human immunodeficiency virus) testing. Routine prenatal testing includes screening for HIV, unless you choose not to have this test.  You may need other tests to make sure you and the baby are doing well.  Follow these instructions at home: Medicines  Follow your health care provider's instructions regarding medicine use. Specific medicines may be either safe or unsafe to take during pregnancy.  Take a prenatal vitamin that contains at least 600 micrograms (mcg) of folic acid.  If you develop constipation, try taking a stool softener if your health care provider approves. Eating and drinking  Eat a balanced diet that includes fresh fruits and vegetables, whole grains, good sources of protein such as meat, eggs, or tofu, and low-fat dairy. Your health care provider will help you determine the amount of weight gain that is right for you.  Avoid raw meat and uncooked cheese. These carry germs that can cause birth defects in the baby.  Eating four or five small meals rather than three large meals a day may help relieve nausea and vomiting. If you start to feel nauseous, eating a few soda crackers can be helpful. Drinking liquids between meals, instead of during meals, also seems to help ease nausea and vomiting.  Limit foods that are high in fat and processed sugars, such as fried and sweet foods.  To prevent constipation: ? Eat foods that are high in fiber, such as fresh fruits and vegetables, whole grains, and beans. ? Drink enough fluid to keep your urine clear or pale yellow. Activity  Exercise only as directed by your health care provider. Most women can continue their usual exercise routine during  pregnancy. Try to exercise for 30 minutes at least 5 days a week. Exercising will help you: ? Control your weight. ? Stay in shape. ? Be prepared for labor and delivery.  Experiencing pain or cramping in the lower abdomen or lower back is a good sign that you should stop exercising. Check with your health care provider before continuing with normal exercises.  Try to avoid standing for long periods of time. Move your legs often if you must stand in one place for a long time.  Avoid heavy lifting.  Wear low-heeled shoes and practice good posture.  You may continue to have sex unless your health care provider tells you not to. Relieving pain and discomfort  Wear a good support bra to relieve breast tenderness.  Take warm sitz baths to soothe any pain or discomfort caused by hemorrhoids. Use hemorrhoid cream if your health care provider approves.  Rest with your legs elevated if you have leg cramps or low back pain.  If you develop   varicose veins in your legs, wear support hose. Elevate your feet for 15 minutes, 3-4 times a day. Limit salt in your diet. Prenatal care  Schedule your prenatal visits by the twelfth week of pregnancy. They are usually scheduled monthly at first, then more often in the last 2 months before delivery.  Write down your questions. Take them to your prenatal visits.  Keep all your prenatal visits as told by your health care provider. This is important. Safety  Wear your seat belt at all times when driving.  Make a list of emergency phone numbers, including numbers for family, friends, the hospital, and police and fire departments. General instructions  Ask your health care provider for a referral to a local prenatal education class. Begin classes no later than the beginning of month 6 of your pregnancy.  Ask for help if you have counseling or nutritional needs during pregnancy. Your health care provider can offer advice or refer you to specialists for help  with various needs.  Do not use hot tubs, steam rooms, or saunas.  Do not douche or use tampons or scented sanitary pads.  Do not cross your legs for long periods of time.  Avoid cat litter boxes and soil used by cats. These carry germs that can cause birth defects in the baby and possibly loss of the fetus by miscarriage or stillbirth.  Avoid all smoking, herbs, alcohol, and medicines not prescribed by your health care provider. Chemicals in these products affect the formation and growth of the baby.  Do not use any products that contain nicotine or tobacco, such as cigarettes and e-cigarettes. If you need help quitting, ask your health care provider. You may receive counseling support and other resources to help you quit.  Schedule a dentist appointment. At home, brush your teeth with a soft toothbrush and be gentle when you floss. Contact a health care provider if:  You have dizziness.  You have mild pelvic cramps, pelvic pressure, or nagging pain in the abdominal area.  You have persistent nausea, vomiting, or diarrhea.  You have a bad smelling vaginal discharge.  You have pain when you urinate.  You notice increased swelling in your face, hands, legs, or ankles.  You are exposed to fifth disease or chickenpox.  You are exposed to German measles (rubella) and have never had it. Get help right away if:  You have a fever.  You are leaking fluid from your vagina.  You have spotting or bleeding from your vagina.  You have severe abdominal cramping or pain.  You have rapid weight gain or loss.  You vomit blood or material that looks like coffee grounds.  You develop a severe headache.  You have shortness of breath.  You have any kind of trauma, such as from a fall or a car accident. Summary  The first trimester of pregnancy is from week 1 until the end of week 13 (months 1 through 3).  Your body goes through many changes during pregnancy. The changes vary from  woman to woman.  You will have routine prenatal visits. During those visits, your health care provider will examine you, discuss any test results you may have, and talk with you about how you are feeling. This information is not intended to replace advice given to you by your health care provider. Make sure you discuss any questions you have with your health care provider. Document Released: 01/14/2001 Document Revised: 01/02/2016 Document Reviewed: 01/02/2016 Elsevier Interactive Patient Education  2018 [employer]  

## 2017-08-12 NOTE — Progress Notes (Signed)
08/12/2017   Chief Complaint: Amenorrhea, positive home pregnancy test, desires prenatal care.  Transfer of Care Patient: no  History of Present Illness: Valerie Gardner is a 27 y.o. G3P2002 at [redacted]w[redacted]d based on Patient's last menstrual period on 06/26/2017, with an Estimated Date of Delivery: 04/02/18, with the above CC.   Her periods were: irregular periods from 30-60 days She was using condoms when she conceived.  She has Positive signs or symptoms of nausea/vomiting of pregnancy. She has Negative signs or symptoms of miscarriage or preterm labor She identifies Negative Zika risk factors for her and her partner On any different medications around the time she conceived/early pregnancy: No  History of varicella: Yes    Review of Systems  Constitutional: Positive for appetite change.  HENT: Negative.   Eyes: Negative.   Respiratory: Negative for shortness of breath and wheezing.   Cardiovascular: Negative for chest pain and palpitations.  Gastrointestinal: Positive for diarrhea and nausea.  Endocrine: Negative.   Genitourinary: Positive for frequency.  Musculoskeletal: Negative.   Skin: Negative.   Allergic/Immunologic: Negative.   Neurological: Positive for headaches.  Hematological: Negative.   Psychiatric/Behavioral: Negative for dysphoric mood. The patient is not nervous/anxious.    Review of systems otherwise negative, except as stated in the above HPI.  OBGYN History: As per HPI. OB History  Gravida Para Term Preterm AB Living  3 2 2     2   SAB TAB Ectopic Multiple Live Births        0 2    # Outcome Date GA Lbr Len/2nd Weight Sex Delivery Anes PTL Lv  3 Current           2 Term 04/29/17 [redacted]w[redacted]d  6 lb 11.6 oz (3.05 kg) M Vag-Spont EPI  LIV  1 Term 06/29/15 [redacted]w[redacted]d / 02:54 7 lb 5.5 oz (3.33 kg) M Vag-Spont EPI  LIV    Any issues with any prior pregnancies: no Any prior children are healthy, doing well, without any problems or issues: yes Last pap smear 10/02/2016.  NILM. History of STIs: No   Past Medical History: Past Medical History:  Diagnosis Date  . PCOS (polycystic ovarian syndrome)     Past Surgical History: Past Surgical History:  Procedure Laterality Date  . NO PAST SURGERIES      Family History:  Family History  Problem Relation Age of Onset  . Diabetes Mellitus I Sister   . Melanoma Maternal Grandmother   . Diabetes Mellitus II Paternal Grandmother   . COPD Paternal Grandmother   . Hypertension Mother   . Hypertension Father    She denies any female cancers, bleeding or blood clotting disorders.  She denies any history of intellectual disability, birth defects or genetic disorders in her or the FOB's history  Social History:  Social History   Socioeconomic History  . Marital status: Married    Spouse name: Not on file  . Number of children: 2  . Years of education: 81  . Highest education level: Not on file  Occupational History  . Occupation: STAY AT HOME MOM  Social Needs  . Financial resource strain: Not on file  . Food insecurity:    Worry: Not on file    Inability: Not on file  . Transportation needs:    Medical: Not on file    Non-medical: Not on file  Tobacco Use  . Smoking status: Former Research scientist (life sciences)  . Smokeless tobacco: Never Used  Substance and Sexual Activity  . Alcohol use: No  .  Drug use: No  . Sexual activity: Yes    Birth control/protection: None  Lifestyle  . Physical activity:    Days per week: Not on file    Minutes per session: Not on file  . Stress: Not on file  Relationships  . Social connections:    Talks on phone: Not on file    Gets together: Not on file    Attends religious service: Not on file    Active member of club or organization: Not on file    Attends meetings of clubs or organizations: Not on file    Relationship status: Not on file  . Intimate partner violence:    Fear of current or ex partner: Not on file    Emotionally abused: Not on file    Physically abused: Not  on file    Forced sexual activity: Not on file  Other Topics Concern  . Not on file  Social History Narrative  . Not on file   Any cats in the household: yes, outdoor, aware to avoid cat feces Denies history of and current domestic violence  Allergy: No Known Allergies  Current Outpatient Medications:  Current Outpatient Medications:  .  Prenatal-DSS-FeCb-FeGl-FA (CITRANATAL BLOOM) 90-1 MG TABS, Take 1 tablet by mouth daily., Disp: 30 tablet, Rfl: 2   Physical Exam:   BP 100/60   Wt 136 lb (61.7 kg)   LMP 06/26/2017   BMI 21.95 kg/m  Body mass index is 21.95 kg/m. Constitutional: Well nourished, well developed female in no acute distress.  Neck:  Supple, normal appearance, and no thyromegaly  Cardiovascular: S1, S2 normal, no murmur, rub or gallop, regular rate and rhythm Respiratory:  Clear to auscultation bilaterally. Normal respiratory effort Abdomen: no masses, hernias; diffusely non tender to palpation, non distended Breasts: breasts appear normal, no suspicious masses, no skin or nipple changes or axillary nodes, patient declines to have breast exam. Neuro/Psych:  Normal mood and affect.  Skin:  Warm and dry.  Lymphatic:  No inguinal lymphadenopathy.   Pelvic exam: is not limited by body habitus External genitalia, Bartholin's glands, Urethra, Skene's glands: within normal limits Vagina: within normal limits and with no blood in the vault  Cervix: normal appearing cervix without discharge or lesions, closed/long/high Uterus:  Enlarged consistent with early pregnancy Adnexa:  no mass, fullness, tenderness  Assessment: Valerie Gardner is a 27 y.o. G3P2002 at [redacted]w[redacted]d based on Patient's last menstrual period on 06/26/2017, with an Estimated Date of Delivery: 04/02/18, presenting for prenatal care.  Plan:  1) Avoid alcoholic beverages. 2) Patient encouraged not to smoke.  3) Discontinue the use of all non-medicinal drugs and chemicals.  4) Take prenatal vitamins daily.   5) Seatbelt use advised 6) Nutrition, food safety (fish, cheese advisories, and high nitrite foods) and exercise discussed. 7) Patient is asked about travel to areas at risk for the Zika virus, and counseled to avoid travel and exposure to mosquitoes or sexual partners who may have themselves been exposed to the virus. Testing is discussed, and will be ordered as appropriate.  8) Genetic Screening, such as with 1st Trimester Screening, cell free fetal DNA, AFP testing, and Ultrasound, as well as with amniocentesis and CV[facility] as appropriate, is discussed with patient. She plans to have genetic testing this pregnancy. 9) Dating ultrasound ordered. 10) [place] samples given and Rx sent.  Problem list reviewed and updated.  Return in about 1 day (around 08/13/2017) for ROB and dating scan.  Avel Sensor, [facility], [facility] 08/12/2017  10:33 AM

## 2017-08-12 NOTE — Progress Notes (Signed)
No concerns.rj 

## 2017-08-13 LAB — RPR+RH+ABO+RUB AB+AB SCR+CB...
ANTIBODY SCREEN: NEGATIVE
HEMATOCRIT: 39 % (ref 34.0–46.6)
HEMOGLOBIN: 12.1 g/dL (ref 11.1–15.9)
HIV Screen 4th Generation wRfx: NONREACTIVE
Hepatitis B Surface Ag: NEGATIVE
MCH: 28.1 pg (ref 26.6–33.0)
MCHC: 31 g/dL — AB (ref 31.5–35.7)
MCV: 91 fL (ref 79–97)
Platelets: 242 10*3/uL (ref 150–450)
RBC: 4.31 x10E6/uL (ref 3.77–5.28)
RDW: 14.9 % (ref 12.3–15.4)
RH TYPE: POSITIVE
RPR Ser Ql: NONREACTIVE
Rubella Antibodies, IGG: 2.25 index (ref >0.99)
Varicella zoster IgG: 233 index (ref >165)
WBC: 4.2 10*3/uL (ref 3.4–10.8)

## 2017-08-14 LAB — URINE DRUG PANEL 7
AMPHETAMINES, URINE: NEGATIVE ng/mL
Barbiturate Quant, Ur: NEGATIVE ng/mL
Benzodiazepine Quant, Ur: NEGATIVE ng/mL
CANNABINOID QUANT UR: NEGATIVE ng/mL
Cocaine (Metab.): NEGATIVE ng/mL
Opiate Quant, Ur: NEGATIVE ng/mL
PCP QUANT UR: NEGATIVE ng/mL

## 2017-08-14 LAB — URINE CULTURE

## 2017-08-14 LAB — CERVICOVAGINAL ANCILLARY ONLY
CHLAMYDIA, DNA PROBE: NEGATIVE
NEISSERIA GONORRHEA: NEGATIVE

## 2017-08-18 ENCOUNTER — Ambulatory Visit (INDEPENDENT_AMBULATORY_CARE_PROVIDER_SITE_OTHER): Payer: Commercial Managed Care - PPO | Admitting: Obstetrics & Gynecology

## 2017-08-18 ENCOUNTER — Ambulatory Visit (INDEPENDENT_AMBULATORY_CARE_PROVIDER_SITE_OTHER): Payer: Commercial Managed Care - PPO

## 2017-08-18 VITALS — BP 110/64 | Wt 136.0 lb

## 2017-08-18 DIAGNOSIS — O3411 Maternal care for benign tumor of corpus uteri, first trimester: Secondary | ICD-10-CM

## 2017-08-18 DIAGNOSIS — Z0189 Encounter for other specified special examinations: Secondary | ICD-10-CM

## 2017-08-18 DIAGNOSIS — O09891 Supervision of other high risk pregnancies, first trimester: Secondary | ICD-10-CM

## 2017-08-18 DIAGNOSIS — Z3480 Encounter for supervision of other normal pregnancy, unspecified trimester: Secondary | ICD-10-CM

## 2017-08-18 DIAGNOSIS — Z3A01 Less than 8 weeks gestation of pregnancy: Secondary | ICD-10-CM

## 2017-08-18 DIAGNOSIS — Z3481 Encounter for supervision of other normal pregnancy, first trimester: Secondary | ICD-10-CM

## 2017-08-18 DIAGNOSIS — N8312 Corpus luteum cyst of left ovary: Secondary | ICD-10-CM

## 2017-08-18 NOTE — Progress Notes (Signed)
  Subjective  Fetal Movement? no Contractions? no Leaking Fluid? no Vaginal Bleeding? no Min nausea Objective  BP 110/64   Wt 136 lb (61.7 kg)   LMP 06/26/2017   BMI 21.95 kg/m  General: NAD Pumonary: no increased work of breathing Abdomen: gravid, non-tender Extremities: no edema Psychiatric: mood appropriate, affect full  Assessment  27 y.o. [id number] at [redacted]w[redacted]d by  04/12/2018, by Ultrasound presenting for routine prenatal visit  Plan   Problem List Items Addressed This Visit      Other   Supervision of other normal pregnancy, antepartum   Short interval between pregnancies affecting pregnancy in first trimester, antepartum    Other Visit Diagnoses    [redacted] weeks gestation of pregnancy    -  Primary    Change EDC based on [place] today Monitor for worsening nasuea Desires MaterniTi cfDNA testing.  Not covered by ins but discussed likely costs w pt (and [facility]).  (Likely 199 out of pocket, applied to deductible).  Pt to highly consider testing, next visit.  Clinic [facility]  Dating [place] changes [facility] 04/12/18 Blood type: --/--/A POS (03/27 0323)   Genetic Screen 1 Screen:    NIPS:desires Antibody:NEG (03/27 0323)  Anatomic [place]  Rubella: 3.52 (08/30 0851) Varicella: Immune  GTT Early:na   Third trimester:  RPR: Non Reactive (03/27 0323)   Rhogam na HBsAg: Negative (08/30 0851)   TDaP vaccine  03/19/17  Flu Shot: HIV: NON REACTIVE (03/27 0323)   Baby Food                                GBS:  Contraception  Pap:09/2016 normal; repeat PP   Barnett Applebaum, MD, [facility], [facility] 08/18/2017  9:07 AM

## 2017-08-18 NOTE — Patient Instructions (Signed)

## 2017-09-14 ENCOUNTER — Encounter: Payer: Self-pay | Admitting: Maternal Newborn

## 2017-09-14 ENCOUNTER — Ambulatory Visit (INDEPENDENT_AMBULATORY_CARE_PROVIDER_SITE_OTHER): Payer: Commercial Managed Care - PPO | Admitting: Maternal Newborn

## 2017-09-14 VITALS — BP 110/58 | Wt 135.0 lb

## 2017-09-14 DIAGNOSIS — Z3A10 10 weeks gestation of pregnancy: Secondary | ICD-10-CM

## 2017-09-14 DIAGNOSIS — Z3480 Encounter for supervision of other normal pregnancy, unspecified trimester: Secondary | ICD-10-CM

## 2017-09-14 DIAGNOSIS — Z1379 Encounter for other screening for genetic and chromosomal anomalies: Secondary | ICD-10-CM

## 2017-09-14 LAB — POCT URINALYSIS DIPSTICK OB: GLUCOSE, UA: NEGATIVE — AB

## 2017-09-14 NOTE — Progress Notes (Signed)
    Routine Prenatal Care Visit  Subjective  Valerie Gardner is a 27 y.o. G3P2002 at [redacted]w[redacted]d being seen today for ongoing prenatal care.  She is currently monitored for the following issues for this low-risk pregnancy and has Hyperandrogenemia; Supervision of other normal pregnancy, antepartum; Short interval between pregnancies affecting pregnancy in first trimester, antepartum; and Pregnancy related nausea, antepartum on their problem list.  ----------------------------------------------------------------------------------- Patient reports that nausea is improving; still has it on some days but not as severe.   Vag. Bleeding: None. ----------------------------------------------------------------------------------- The following portions of the patient's history were reviewed and updated as appropriate: allergies, current medications, past family history, past medical history, past social history, past surgical history and problem list. Problem list updated.  Objective  Blood pressure (!) 110/58, weight 135 lb (61.2 kg), last menstrual period 06/26/2017. Body mass index is 21.79 kg/m. Pregravid weight 135 lb (61.2 kg) Total Weight Gain 0 lb (0 kg) Urinalysis: Protein Trace, Glucose Negative  General:  Alert, oriented and cooperative. Patient is in no acute distress.  Skin: Skin is warm and dry. No rash noted.   Cardiovascular: Normal heart rate noted  Respiratory: Normal respiratory effort, no problems with respiration noted  Abdomen: Soft, gravid, appropriate for gestational age. Pain/Pressure: Absent     Pelvic:  Cervical exam deferred        Extremities: Normal range of motion.     Mental Status: Normal mood and affect. Normal behavior. Normal judgment and thought content.    Assessment   27 y.o. [id number] at [redacted]w[redacted]d, EDD 04/12/2018 by Ultrasound presenting for routine prenatal visit.  Plan   THIRD Problems (from 08/12/17 to present)    Problem Noted Resolved   Supervision of  other normal pregnancy, antepartum 08/12/2017 by Rexene Agent, CNM No   Overview Addendum 08/18/2017  9:10 AM by Gae Dry, MD    [facility]inic [facility]  Dating [place] changes [facility] 04/12/18 Blood type: --/--/A POS (03/27 0323)   Genetic Screen 1 Screen:    NIPS:desires Antibody:NEG (03/27 0323)  Anatomic [place]  Rubella: 3.52 (08/30 0851) Varicella: Immune  GTT Early:na   Third trimester:  RPR: Non Reactive (03/27 0323)   Rhogam na HBsAg: Negative (08/30 0851)   TDaP vaccine  03/19/17  Flu Shot: HIV: NON REACTIVE (03/27 0323)   Baby Food                                GBS:  Contraception  Pap:09/2016 normal; repeat PP  CBB     CS/VBAC na   Support Person              Short interval between pregnancies affecting pregnancy in first trimester, antepartum 08/12/2017 by Rexene Agent, CNM No    MaterniTi21 today.   Gestational age appropriate obstetric precautions were reviewed.  Return in about 4 weeks (around 10/12/2017) for ROB.  Avel Sensor, CNM 09/14/2017  8:44 AM

## 2017-09-19 LAB — MATERNIT 21 PLUS CORE, BLOOD
CHROMOSOME 13: NEGATIVE
CHROMOSOME 18: NEGATIVE
CHROMOSOME 21: NEGATIVE
Y CHROMOSOME: NOT DETECTED

## 2017-10-12 ENCOUNTER — Encounter: Payer: Self-pay | Admitting: Obstetrics and Gynecology

## 2017-10-12 ENCOUNTER — Ambulatory Visit (INDEPENDENT_AMBULATORY_CARE_PROVIDER_SITE_OTHER): Payer: Commercial Managed Care - PPO | Admitting: Obstetrics and Gynecology

## 2017-10-12 VITALS — BP 102/60 | Wt 141.0 lb

## 2017-10-12 DIAGNOSIS — O09891 Supervision of other high risk pregnancies, first trimester: Secondary | ICD-10-CM

## 2017-10-12 DIAGNOSIS — Z3A14 14 weeks gestation of pregnancy: Secondary | ICD-10-CM

## 2017-10-12 DIAGNOSIS — Z3480 Encounter for supervision of other normal pregnancy, unspecified trimester: Secondary | ICD-10-CM

## 2017-10-12 DIAGNOSIS — O26899 Other specified pregnancy related conditions, unspecified trimester: Secondary | ICD-10-CM

## 2017-10-12 DIAGNOSIS — R110 Nausea: Secondary | ICD-10-CM

## 2017-10-12 LAB — POCT URINALYSIS DIPSTICK OB
GLUCOSE, UA: NEGATIVE
PROTEIN: NEGATIVE

## 2017-10-12 NOTE — Addendum Note (Signed)
Addended by: Raechel Chute on: 10/12/2017 09:21 AM   Modules accepted: Orders

## 2017-10-12 NOTE — Progress Notes (Signed)
    Routine Prenatal Care Visit  Subjective  Valerie Gardner is a 27 y.o. G3P2002 at [redacted]w[redacted]d being seen today for ongoing prenatal care.  She is currently monitored for the following issues for this low-risk pregnancy and has Hyperandrogenemia; Supervision of other normal pregnancy, antepartum; Short interval between pregnancies affecting pregnancy in first trimester, antepartum; and Pregnancy related nausea, antepartum on their problem list.  ----------------------------------------------------------------------------------- Patient reports no complaints.   Contractions: Not present. Vag. Bleeding: None.  Movement: Absent. Denies leaking of fluid.  ----------------------------------------------------------------------------------- The following portions of the patient's history were reviewed and updated as appropriate: allergies, current medications, past family history, past medical history, past social history, past surgical history and problem list. Problem list updated.   Objective  Blood pressure 102/60, weight 141 lb (64 kg), last menstrual period 06/26/2017, unknown if currently breastfeeding. Pregravid weight 135 lb (61.2 kg) Total Weight Gain 6 lb (2.722 kg) Urinalysis:      Fetal Status: Fetal Heart Rate (bpm): 144   Movement: Absent     General:  Alert, oriented and cooperative. Patient is in no acute distress.  Skin: Skin is warm and dry. No rash noted.   Cardiovascular: Normal heart rate noted  Respiratory: Normal respiratory effort, no problems with respiration noted  Abdomen: Soft, gravid, appropriate for gestational age. Pain/Pressure: Absent     Pelvic:  Cervical exam deferred        Extremities: Normal range of motion.     ental Status: Normal mood and affect. Normal behavior. Normal judgment and thought content.     Assessment   27 y.o. G3P2002 at [redacted]w[redacted]d by  04/12/2018, by Ultrasound presenting for routine prenatal visit  Plan   THIRD Problems (from 08/12/17  to present)    Problem Noted Resolved   Supervision of other normal pregnancy, antepartum 08/12/2017 by Rexene Agent, CNM No   Overview Addendum 10/12/2017  8:01 AM by Homero Fellers, MD    [facility]  Dating [place] changes [facility] 04/12/18 Blood type: A/Positive/-- (07/10 1110)   Genetic Screen NIPS:normal XX Antibody:Negative (07/10 1110)  Anatomic [place]  Rubella: 2.25 (07/10 1110) Varicella: Immune  GTT Early:na   Third trimester:  RPR: Non Reactive (07/10 1110)   Rhogam na HBsAg: Negative (07/10 1110)   TDaP vaccine  03/19/17   Flu Shot: HIV: Non Reactive (07/10 1110)   Baby Food                                GBS:  Contraception  Pap:09/2016 normal; repeat PP  CBB     CS/VBAC na   Support Person              Short interval between pregnancies affecting pregnancy in first trimester, antepartum 08/12/2017 by Rexene Agent, CNM No       Gestational age appropriate obstetric precautions including but not limited to vaginal bleeding, contractions, leaking of fluid and fetal movement were reviewed in detail with the patient.    Return in about 4 weeks (around 11/09/2017) for [place] and [place].  Not taking prenatal vitamin, gave her 4 different samples to try. Encouraged to take a prenatal.  Planning on formula feeding.  Undecided about birth control- partner may have vasectomy. She was given a booklet about contraceptive options.   Adrian Prows MD [facility], [facility] 10/12/17 9:11 AM

## 2017-10-12 NOTE — Progress Notes (Signed)
ROB No concerns  Declines flu shot  

## 2017-11-09 ENCOUNTER — Ambulatory Visit (INDEPENDENT_AMBULATORY_CARE_PROVIDER_SITE_OTHER): Payer: Commercial Managed Care - PPO

## 2017-11-09 ENCOUNTER — Ambulatory Visit (INDEPENDENT_AMBULATORY_CARE_PROVIDER_SITE_OTHER): Payer: Commercial Managed Care - PPO | Admitting: Obstetrics & Gynecology

## 2017-11-09 VITALS — BP 118/80 | Wt 143.0 lb

## 2017-11-09 DIAGNOSIS — Z3A18 18 weeks gestation of pregnancy: Secondary | ICD-10-CM

## 2017-11-09 DIAGNOSIS — Z363 Encounter for antenatal screening for malformations: Secondary | ICD-10-CM | POA: Diagnosis not present

## 2017-11-09 DIAGNOSIS — Z3482 Encounter for supervision of other normal pregnancy, second trimester: Secondary | ICD-10-CM

## 2017-11-09 DIAGNOSIS — Z3480 Encounter for supervision of other normal pregnancy, unspecified trimester: Secondary | ICD-10-CM

## 2017-11-09 NOTE — Patient Instructions (Signed)

## 2017-11-09 NOTE — Progress Notes (Signed)
  Subjective  Fetal Movement? yes Contractions? no Leaking Fluid? no Vaginal Bleeding? no Occas leg numbness and dizziness Objective  BP 118/80   Wt 143 lb (64.9 kg)   LMP 06/26/2017   BMI 23.08 kg/m  General: NAD Pumonary: no increased work of breathing Abdomen: gravid, non-tender Extremities: no edema Psychiatric: mood appropriate, affect full  Assessment  27 y.o. [id number] at [redacted]w[redacted]d by  04/12/2018, by Ultrasound presenting for routine prenatal visit  Plan   Problem List Items Addressed This Visit      Other   Supervision of other normal pregnancy, antepartum    Other Visit Diagnoses    [redacted] weeks gestation of pregnancy    -  Primary    Review of ULTRASOUND. I have personally reviewed images and report of recent ultrasound done at [facility]. There is a singleton gestation with subjectively normal amniotic fluid volume. The fetal biometry correlates with established dating. Detailed evaluation of the fetal anatomy was performed.The fetal anatomical survey appears within normal limits within the resolution of ultrasound as described above.  It must be noted that a normal ultrasound is unable to rule out fetal aneuploidy.    PNV  Barnett Applebaum, MD, [facility], [facility] 11/09/2017  9:46 AM

## 2017-12-07 ENCOUNTER — Ambulatory Visit (INDEPENDENT_AMBULATORY_CARE_PROVIDER_SITE_OTHER): Payer: Commercial Managed Care - PPO | Admitting: Obstetrics and Gynecology

## 2017-12-07 VITALS — BP 126/68 | Wt 150.0 lb

## 2017-12-07 DIAGNOSIS — Z3A22 22 weeks gestation of pregnancy: Secondary | ICD-10-CM

## 2017-12-07 DIAGNOSIS — Z3482 Encounter for supervision of other normal pregnancy, second trimester: Secondary | ICD-10-CM

## 2017-12-07 DIAGNOSIS — Z3480 Encounter for supervision of other normal pregnancy, unspecified trimester: Secondary | ICD-10-CM

## 2017-12-07 LAB — POCT URINALYSIS DIPSTICK OB
GLUCOSE, UA: NEGATIVE
POC,PROTEIN,UA: NEGATIVE

## 2017-12-07 NOTE — Progress Notes (Signed)
ROB

## 2017-12-07 NOTE — Progress Notes (Signed)
Routine Prenatal Care Visit  Subjective  Valerie Gardner is a 27 y.o. G3P2002 at [redacted]w[redacted]d being seen today for ongoing prenatal care.  She is currently monitored for the following issues for this low-risk pregnancy and has Hyperandrogenemia; Supervision of other normal pregnancy, antepartum; Short interval between pregnancies affecting pregnancy in first trimester, antepartum; and Pregnancy related nausea, antepartum on their problem list.  ----------------------------------------------------------------------------------- Patient reports no complaints.   Contractions: Not present. Vag. Bleeding: None.  Movement: Present. Denies leaking of fluid.  ----------------------------------------------------------------------------------- The following portions of the patient's history were reviewed and updated as appropriate: allergies, current medications, past family history, past medical history, past social history, past surgical history and problem list. Problem list updated.   Objective  Blood pressure 126/68, weight 150 lb (68 kg), last menstrual period 06/26/2017, unknown if currently breastfeeding. Pregravid weight 135 lb (61.2 kg) Total Weight Gain 15 lb (6.804 kg) Urinalysis:      Fetal Status: Fetal Heart Rate (bpm): 145   Movement: Present     General:  Alert, oriented and cooperative. Patient is in no acute distress.  Skin: Skin is warm and dry. No rash noted.   Cardiovascular: Normal heart rate noted  Respiratory: Normal respiratory effort, no problems with respiration noted  Abdomen: Soft, gravid, appropriate for gestational age. Pain/Pressure: Absent     Pelvic:  Cervical exam deferred        Extremities: Normal range of motion.     ental Status: Normal mood and affect. Normal behavior. Normal judgment and thought content.     Assessment   27 y.o. G3P2002 at [redacted]w[redacted]d by  04/12/2018, by Ultrasound presenting for routine prenatal visit  Plan   THIRD Problems (from  08/12/17 to present)    Problem Noted Resolved   Supervision of other normal pregnancy, antepartum 08/12/2017 by Rexene Agent, CNM No   Overview Addendum 12/07/2017  9:01 AM by Malachy Mood, MD    [facility]  Dating [place] changes [facility] 04/12/18 Blood type: A/Positive/-- (07/10 1110)   Genetic Screen NIPS:normal XX Antibody:Negative (07/10 1110)  Anatomic [place] Normal Female Rubella: 2.25 (07/10 1110) Varicella: Immune  GTT Early:na   Third trimester:  RPR: Non Reactive (07/10 1110)   Rhogam na HBsAg: Negative (07/10 1110)   TDaP vaccine  03/19/17   Flu Shot: declines HIV: Non Reactive (07/10 1110)   Baby Food  Formula                              GBS:  Contraception  Unsure, considering vasectomy, used OCP in past Pap:09/2016 normal; repeat PP  CBB   Pelvis tested to 7lbs 5oz  CS/VBAC na   Support Person Gil[place] Raid interval between pregnancies affecting pregnancy in first trimester, antepartum 08/12/2017 by Rexene Agent, CNM No       Gestational age appropriate obstetric precautions including but not limited to vaginal bleeding, contractions, leaking of fluid and fetal movement were reviewed in detail with the patient.    Discussed benefits of influenza vaccination during pregnancy.  The CDC recommends influenza vaccination for all pregnant patient regardless of trimester.  Besides the benefits of preventing influenza in the mother we discussed transfer of maternal antibodies to fetus as well as secretion of antibodies in breast milk which are protective for the infant.    In case of positive influenza test in  the patient or a close household contact, the use of Tamiflu is also recommended by the CDC.     Return in about 4 weeks (around 01/04/2018) for 4 weeks ROB, 6 weeks ROB and 28 week labs.  Malachy Mood, MD, [facility], [facility] 12/07/2017, 9:11 AM

## 2018-01-04 ENCOUNTER — Ambulatory Visit (INDEPENDENT_AMBULATORY_CARE_PROVIDER_SITE_OTHER): Payer: Commercial Managed Care - PPO | Admitting: Maternal Newborn

## 2018-01-04 ENCOUNTER — Encounter: Payer: Self-pay | Admitting: Maternal Newborn

## 2018-01-04 VITALS — BP 114/60 | Wt 155.0 lb

## 2018-01-04 DIAGNOSIS — Z3480 Encounter for supervision of other normal pregnancy, unspecified trimester: Secondary | ICD-10-CM

## 2018-01-04 DIAGNOSIS — Z3A26 26 weeks gestation of pregnancy: Secondary | ICD-10-CM

## 2018-01-04 DIAGNOSIS — Z3482 Encounter for supervision of other normal pregnancy, second trimester: Secondary | ICD-10-CM

## 2018-01-04 NOTE — Progress Notes (Signed)
ROB No concerns 

## 2018-01-04 NOTE — Progress Notes (Signed)
    Routine Prenatal Care Visit  Subjective  Valerie Gardner is a 27 y.o. G3P2002 at [redacted]w[redacted]d being seen today for ongoing prenatal care.  She is currently monitored for the following issues for this low-risk pregnancy and has Hyperandrogenemia; Supervision of other normal pregnancy, antepartum; Short interval between pregnancies affecting pregnancy in first trimester, antepartum; and Pregnancy related nausea, antepartum on their problem list.  ----------------------------------------------------------------------------------- Patient reports no complaints.   Contractions: Not present. Vag. Bleeding: None.  Movement: Present. No leaking of fluid.  ----------------------------------------------------------------------------------- The following portions of the patient's history were reviewed and updated as appropriate: allergies, current medications, past family history, past medical history, past social history, past surgical history and problem list. Problem list updated.  Objective  Blood pressure 114/60, weight 155 lb (70.3 kg), last menstrual period 06/26/2017. Pregravid weight 135 lb (61.2 kg) Total Weight Gain 20 lb (9.072 kg) Body mass index is 25.02 kg/m.   Urinalysis: Protein Negative, Glucose Negative  Fetal Status: Fetal Heart Rate (bpm): 144 Fundal Height: 26 cm Movement: Present     General:  Alert, oriented and cooperative. Patient is in no acute distress.  Skin: Skin is warm and dry. No rash noted.   Cardiovascular: Normal heart rate noted  Respiratory: Normal respiratory effort, no problems with respiration noted  Abdomen: Soft, gravid, appropriate for gestational age. Pain/Pressure: Absent     Pelvic:  Cervical exam deferred        Extremities: Normal range of motion.  Edema: None  Mental Status: Normal mood and affect. Normal behavior. Normal judgment and thought content.     Assessment   27 y.o. [id number] at [redacted]w[redacted]d, EDD 04/12/2018 by Ultrasound presenting for a  routine prenatal visit.  Plan   THIRD Problems (from 08/12/17 to present)    Problem Noted Resolved   Supervision of other normal pregnancy, antepartum 08/12/2017 by Rexene Agent, CNM No   Overview Addendum 12/07/2017  9:01 AM by Malachy Mood, MD    [facility]inic [facility]  Dating [place] changes [facility] 04/12/18 Blood type: A/Positive/-- (07/10 1110)   Genetic Screen NIPS:normal XX Antibody:Negative (07/10 1110)  Anatomic [place] Normal Female Rubella: 2.25 (07/10 1110) Varicella: Immune  GTT Early:na   Third trimester:  RPR: Non Reactive (07/10 1110)   Rhogam na HBsAg: Negative (07/10 1110)   TDaP vaccine  03/19/17   Flu Shot: declines HIV: Non Reactive (07/10 1110)   Baby Food  Formula                              GBS:  Contraception  Unsure, considering vasectomy, used OCP in past Pap:09/2016 normal; repeat PP  CBB   Pelvis tested to 7lbs 5oz  CS/VBAC na   Support Person Gil[place] Raid interval between pregnancies affecting pregnancy in first trimester, antepartum 08/12/2017 by Rexene Agent, CNM No    Needs dental fax sent to Dr. Gloris Manchester for scheduled dental appointment next week.  Discussed that GTT/labs will be done next visit.  Return in about 2 weeks (around 01/18/2018) for ROB and GTT/28 week labs.  Avel Sensor, CNM 01/04/2018  9:02 AM

## 2018-01-18 ENCOUNTER — Encounter: Payer: Self-pay | Admitting: Obstetrics and Gynecology

## 2018-01-18 ENCOUNTER — Ambulatory Visit (INDEPENDENT_AMBULATORY_CARE_PROVIDER_SITE_OTHER): Payer: Commercial Managed Care - PPO | Admitting: Obstetrics and Gynecology

## 2018-01-18 ENCOUNTER — Other Ambulatory Visit: Payer: Commercial Managed Care - PPO

## 2018-01-18 VITALS — BP 108/66 | Wt 158.0 lb

## 2018-01-18 DIAGNOSIS — O26893 Other specified pregnancy related conditions, third trimester: Secondary | ICD-10-CM

## 2018-01-18 DIAGNOSIS — Z3480 Encounter for supervision of other normal pregnancy, unspecified trimester: Secondary | ICD-10-CM

## 2018-01-18 DIAGNOSIS — O26899 Other specified pregnancy related conditions, unspecified trimester: Secondary | ICD-10-CM

## 2018-01-18 DIAGNOSIS — R110 Nausea: Secondary | ICD-10-CM

## 2018-01-18 DIAGNOSIS — Z3A28 28 weeks gestation of pregnancy: Secondary | ICD-10-CM

## 2018-01-18 DIAGNOSIS — O09891 Supervision of other high risk pregnancies, first trimester: Secondary | ICD-10-CM

## 2018-01-18 LAB — POCT URINALYSIS DIPSTICK OB
GLUCOSE, UA: NEGATIVE
POC,PROTEIN,UA: NEGATIVE

## 2018-01-18 NOTE — Progress Notes (Signed)
  Routine Prenatal Care Visit  Subjective  Valerie Gardner is a 27 y.o. G3P2002 at [redacted]w[redacted]d being seen today for ongoing prenatal care.  She is currently monitored for the following issues for this low-risk pregnancy and has Hyperandrogenemia; Supervision of other normal pregnancy, antepartum; Short interval between pregnancies affecting pregnancy in first trimester, antepartum; and Pregnancy related nausea, antepartum on their problem list.  ----------------------------------------------------------------------------------- Patient reports no complaints.   Contractions: Not present. Vag. Bleeding: None.  Movement: Present. Denies leaking of fluid.  ----------------------------------------------------------------------------------- The following portions of the patient's history were reviewed and updated as appropriate: allergies, current medications, past family history, past medical history, past social history, past surgical history and problem list. Problem list updated.   Objective  Blood pressure 108/66, weight 158 lb (71.7 kg), last menstrual period 06/26/2017, unknown if currently breastfeeding. Pregravid weight 135 lb (61.2 kg) Total Weight Gain 23 lb (10.4 kg) Urinalysis: Urine Protein Negative  Urine Glucose Negative  Fetal Status: Fetal Heart Rate (bpm): 145 Fundal Height: 28 cm Movement: Present     General:  Alert, oriented and cooperative. Patient is in no acute distress.  Skin: Skin is warm and dry. No rash noted.   Cardiovascular: Normal heart rate noted  Respiratory: Normal respiratory effort, no problems with respiration noted  Abdomen: Soft, gravid, appropriate for gestational age. Pain/Pressure: Absent     Pelvic:  Cervical exam deferred        Extremities: Normal range of motion.  Edema: None  Mental Status: Normal mood and affect. Normal behavior. Normal judgment and thought content.   Assessment   27 y.o. G3P2002 at [redacted]w[redacted]d by  04/12/2018, by Ultrasound presenting  for routine prenatal visit  Plan   THIRD Problems (from 08/12/17 to present)    Problem Noted Resolved   Supervision of other normal pregnancy, antepartum 08/12/2017 by Rexene Agent, CNM No   Overview Addendum 12/07/2017  9:01 AM by Malachy Mood, MD    Clinic [facility] Prenatal Labs  Dating [place] changes [facility] 04/12/18 Blood type: A/Positive/-- (07/10 1110)   Genetic Screen NIPS:normal XX Antibody:Negative (07/10 1110)  Anatomic [place] Normal Female Rubella: 2.25 (07/10 1110) Varicella: Immune  GTT Early:na   Third trimester:  RPR: Non Reactive (07/10 1110)   Rhogam na HBsAg: Negative (07/10 1110)   TDaP vaccine  03/19/17   Flu Shot: declines HIV: Non Reactive (07/10 1110)   Baby Food  Formula                              GBS:  Contraception  Unsure, considering vasectomy, used OCP in past Pap:09/2016 normal; repeat PP  CBB   Pelvis tested to 7lbs 5oz  CS/VBAC na   Support Person Gilford Raid interval between pregnancies affecting pregnancy in first trimester, antepartum 08/12/2017 by Rexene Agent, CNM No       Preterm labor symptoms and general obstetric precautions including but not limited to vaginal bleeding, contractions, leaking of fluid and fetal movement were reviewed in detail with the patient. Please refer to After Visit Summary for other counseling recommendations.   - 28 week labs today - no issues.   Return in about 2 weeks (around 02/01/2018) for Routine Prenatal Appointment.  Prentice Docker, MD, [facility], [facility] 01/18/2018 9:00 AM

## 2018-01-19 LAB — 28 WEEK RH+PANEL
BASOS ABS: 0 10*3/uL (ref 0.0–0.2)
Basos: 0 %
EOS (ABSOLUTE): 0.1 10*3/uL (ref 0.0–0.4)
Eos: 1 %
Gestational Diabetes Screen: 109 mg/dL (ref 65–139)
HEMATOCRIT: 29.1 % — AB (ref 34.0–46.6)
HIV Screen 4th Generation wRfx: NONREACTIVE
Hemoglobin: 9.2 g/dL — ABNORMAL LOW (ref 11.1–15.9)
IMMATURE GRANS (ABS): 0.1 10*3/uL (ref 0.0–0.1)
Immature Granulocytes: 2 %
LYMPHS: 26 %
Lymphocytes Absolute: 1.8 10*3/uL (ref 0.7–3.1)
MCH: 27.1 pg (ref 26.6–33.0)
MCHC: 31.6 g/dL (ref 31.5–35.7)
MCV: 86 fL (ref 79–97)
MONOCYTES: 8 %
Monocytes Absolute: 0.5 10*3/uL (ref 0.1–0.9)
Neutrophils Absolute: 4.3 10*3/uL (ref 1.4–7.0)
Neutrophils: 63 %
PLATELETS: 196 10*3/uL (ref 150–450)
RBC: 3.4 x10E6/uL — ABNORMAL LOW (ref 3.77–5.28)
RDW: 13.1 % (ref 12.3–15.4)
RPR Ser Ql: NONREACTIVE
WBC: 6.8 10*3/uL (ref 3.4–10.8)

## 2018-02-01 ENCOUNTER — Encounter: Payer: Self-pay | Admitting: Advanced Practice Midwife

## 2018-02-01 ENCOUNTER — Ambulatory Visit (INDEPENDENT_AMBULATORY_CARE_PROVIDER_SITE_OTHER): Payer: Commercial Managed Care - PPO | Admitting: Advanced Practice Midwife

## 2018-02-01 VITALS — BP 130/60 | Wt 159.0 lb

## 2018-02-01 DIAGNOSIS — Z3A30 30 weeks gestation of pregnancy: Secondary | ICD-10-CM

## 2018-02-01 LAB — POCT URINALYSIS DIPSTICK OB
GLUCOSE, UA: NEGATIVE
POC,PROTEIN,UA: NEGATIVE

## 2018-02-01 NOTE — Progress Notes (Signed)
  Routine Prenatal Care Visit  Subjective  Valerie Gardner is a 27 y.o. G3P2002 at [redacted]w[redacted]d being seen today for ongoing prenatal care.  She is currently monitored for the following issues for this low-risk pregnancy and has Hyperandrogenemia; Supervision of other normal pregnancy, antepartum; Short interval between pregnancies affecting pregnancy in first trimester, antepartum; and Pregnancy related nausea, antepartum on their problem list.  ----------------------------------------------------------------------------------- Patient reports a little bit of heartburn. TUMS are helping.  She is still uncertain about birth control method. She is interested in non-hormonal but may consider pill. Reviewed various methods. Contractions: Not present. Vag. Bleeding: None.  Movement: Present. Denies leaking of fluid.  ----------------------------------------------------------------------------------- The following portions of the patient's history were reviewed and updated as appropriate: allergies, current medications, past family history, past medical history, past social history, past surgical history and problem list. Problem list updated.   Objective  Blood pressure 130/60, weight 159 lb (72.1 kg), last menstrual period 06/26/2017 Pregravid weight 135 lb (61.2 kg) Total Weight Gain 24 lb (10.9 kg) Urinalysis: Urine Protein Negative  Urine Glucose Negative  Fetal Status: Fetal Heart Rate (bpm): 136 Fundal Height: 30 cm Movement: Present     General:  Alert, oriented and cooperative. Patient is in no acute distress.  Skin: Skin is warm and dry. No rash noted.   Cardiovascular: Normal heart rate noted  Respiratory: Normal respiratory effort, no problems with respiration noted  Abdomen: Soft, gravid, appropriate for gestational age. Pain/Pressure: Absent     Pelvic:  Cervical exam deferred        Extremities: Normal range of motion.  Edema: None  Mental Status: Normal mood and affect. Normal  behavior. Normal judgment and thought content.   Assessment   27 y.o. G3P2002 at [redacted]w[redacted]d by  04/12/2018, by Ultrasound presenting for routine prenatal visit  Plan   THIRD Problems (from 08/12/17 to present)    Problem Noted Resolved   Supervision of other normal pregnancy, antepartum 08/12/2017 by Rexene Agent, CNM No   Overview Addendum 12/07/2017  9:01 AM by Malachy Mood, MD    Clinic [facility] Prenatal Labs  Dating [place] changes [facility] 04/12/18 Blood type: A/Positive/-- (07/10 1110)   Genetic Screen NIPS:normal XX Antibody:Negative (07/10 1110)  Anatomic [place] Normal Female Rubella: 2.25 (07/10 1110) Varicella: Immune  GTT Early:na   Third trimester:  RPR: Non Reactive (07/10 1110)   Rhogam na HBsAg: Negative (07/10 1110)   TDaP vaccine  03/19/17   Flu Shot: declines HIV: Non Reactive (07/10 1110)   Baby Food  Formula                              GBS:  Contraception  Unsure, considering vasectomy, used OCP in past Pap:09/2016 normal; repeat PP  CBB   Pelvis tested to 7lbs 5oz  CS/VBAC na   Support Person Gilford Raid interval between pregnancies affecting pregnancy in first trimester, antepartum 08/12/2017 by Rexene Agent, CNM No       Preterm labor symptoms and general obstetric precautions including but not limited to vaginal bleeding, contractions, leaking of fluid and fetal movement were reviewed in detail with the patient. Please refer to After Visit Summary for other counseling recommendations.   Return in about 2 weeks (around 02/15/2018) for rob.  Rod Can, CNM 02/01/2018 9:30 AM

## 2018-02-01 NOTE — Patient Instructions (Signed)
Third Trimester of Pregnancy The third trimester is from week 28 through week 40 (months 7 through 9). The third trimester is a time when the unborn baby (fetus) is growing rapidly. At the end of the ninth month, the fetus is about 20 inches in length and weighs 6-10 pounds. Body changes during your third trimester Your body will continue to go through many changes during pregnancy. The changes vary from woman to woman. During the third trimester:  Your weight will continue to increase. You can expect to gain 25-35 pounds (11-16 kg) by the end of the pregnancy.  You may begin to get stretch marks on your hips, abdomen, and breasts.  You may urinate more often because the fetus is moving lower into your pelvis and pressing on your bladder.  You may develop or continue to have heartburn. This is caused by increased hormones that slow down muscles in the digestive tract.  You may develop or continue to have constipation because increased hormones slow digestion and cause the muscles that push waste through your intestines to relax.  You may develop hemorrhoids. These are swollen veins (varicose veins) in the rectum that can itch or be painful.  You may develop swollen, bulging veins (varicose veins) in your legs.  You may have increased body aches in the pelvis, back, or thighs. This is due to weight gain and increased hormones that are relaxing your joints.  You may have changes in your hair. These can include thickening of your hair, rapid growth, and changes in texture. Some women also have hair loss during or after pregnancy, or hair that feels dry or thin. Your hair will most likely return to normal after your baby is born.  Your breasts will continue to grow and they will continue to become tender. A yellow fluid (colostrum) may leak from your breasts. This is the first milk you are producing for your baby.  Your belly button may stick out.  You may notice more swelling in your hands,  face, or ankles.  You may have increased tingling or numbness in your hands, arms, and legs. The skin on your belly may also feel numb.  You may feel short of breath because of your expanding uterus.  You may have more problems sleeping. This can be caused by the size of your belly, increased need to urinate, and an increase in your body's metabolism.  You may notice the fetus "dropping," or moving lower in your abdomen (lightening).  You may have increased vaginal discharge.  You may notice your joints feel loose and you may have pain around your pelvic bone. What to expect at prenatal visits You will have prenatal exams every 2 weeks until week 36. Then you will have weekly prenatal exams. During a routine prenatal visit:  You will be weighed to make sure you and the baby are growing normally.  Your blood pressure will be taken.  Your abdomen will be measured to track your baby's growth.  The fetal heartbeat will be listened to.  Any test results from the previous visit will be discussed.  You may have a cervical check near your due date to see if your cervix has softened or thinned (effaced).  You will be tested for Group B streptococcus. This happens between 35 and 37 weeks. Your health care provider may ask you:  What your birth plan is.  How you are feeling.  If you are feeling the baby move.  If you have had any abnormal   symptoms, such as leaking fluid, bleeding, severe headaches, or abdominal cramping.  If you are using any tobacco products, including cigarettes, chewing tobacco, and electronic cigarettes.  If you have any questions. Other tests or screenings that may be performed during your third trimester include:  Blood tests that check for low iron levels (anemia).  Fetal testing to check the health, activity level, and growth of the fetus. Testing is done if you have certain medical conditions or if there are problems during the pregnancy.  Nonstress test  (NST). This test checks the health of your baby to make sure there are no signs of problems, such as the baby not getting enough oxygen. During this test, a belt is placed around your belly. The baby is made to move, and its heart rate is monitored during movement. What is false labor? False labor is a condition in which you feel small, irregular tightenings of the muscles in the womb (contractions) that usually go away with rest, changing position, or drinking water. These are called Braxton Hicks contractions. Contractions may last for hours, days, or even weeks before true labor sets in. If contractions come at regular intervals, become more frequent, increase in intensity, or become painful, you should see your health care provider. What are the signs of labor?  Abdominal cramps.  Regular contractions that start at 10 minutes apart and become stronger and more frequent with time.  Contractions that start on the top of the uterus and spread down to the lower abdomen and back.  Increased pelvic pressure and dull back pain.  A watery or bloody mucus discharge that comes from the vagina.  Leaking of amniotic fluid. This is also known as your "water breaking." It could be a slow trickle or a gush. Let your health care provider know if it has a color or strange odor. If you have any of these signs, call your health care provider right away, even if it is before your due date. Follow these instructions at home: Medicines  Follow your health care provider's instructions regarding medicine use. Specific medicines may be either safe or unsafe to take during pregnancy.  Take a prenatal vitamin that contains at least 600 micrograms (mcg) of folic acid.  If you develop constipation, try taking a stool softener if your health care provider approves. Eating and drinking   Eat a balanced diet that includes fresh fruits and vegetables, whole grains, good sources of protein such as meat, eggs, or tofu,  and low-fat dairy. Your health care provider will help you determine the amount of weight gain that is right for you.  Avoid raw meat and uncooked cheese. These carry germs that can cause birth defects in the baby.  If you have low calcium intake from food, talk to your health care provider about whether you should take a daily calcium supplement.  Eat four or five small meals rather than three large meals a day.  Limit foods that are high in fat and processed sugars, such as fried and sweet foods.  To prevent constipation: ? Drink enough fluid to keep your urine clear or pale yellow. ? Eat foods that are high in fiber, such as fresh fruits and vegetables, whole grains, and beans. Activity  Exercise only as directed by your health care provider. Most women can continue their usual exercise routine during pregnancy. Try to exercise for 30 minutes at least 5 days a week. Stop exercising if you experience uterine contractions.  Avoid heavy lifting.  Do   not exercise in extreme heat or humidity, or at high altitudes.  Wear low-heel, comfortable shoes.  Practice good posture.  You may continue to have sex unless your health care provider tells you otherwise. Relieving pain and discomfort  Take frequent breaks and rest with your legs elevated if you have leg cramps or low back pain.  Take warm sitz baths to soothe any pain or discomfort caused by hemorrhoids. Use hemorrhoid cream if your health care provider approves.  Wear a good support bra to prevent discomfort from breast tenderness.  If you develop varicose veins: ? Wear support pantyhose or compression stockings as told by your healthcare provider. ? Elevate your feet for 15 minutes, 3-4 times a day. Prenatal care  Write down your questions. Take them to your prenatal visits.  Keep all your prenatal visits as told by your health care provider. This is important. Safety  Wear your seat belt at all times when driving.  Make  a list of emergency phone numbers, including numbers for family, friends, the hospital, and police and fire departments. General instructions  Avoid cat litter boxes and soil used by cats. These carry germs that can cause birth defects in the baby. If you have a cat, ask someone to clean the litter box for you.  Do not travel far distances unless it is absolutely necessary and only with the approval of your health care provider.  Do not use hot tubs, steam rooms, or saunas.  Do not drink alcohol.  Do not use any products that contain nicotine or tobacco, such as cigarettes and e-cigarettes. If you need help quitting, ask your health care provider.  Do not use any medicinal herbs or unprescribed drugs. These chemicals affect the formation and growth of the baby.  Do not douche or use tampons or scented sanitary pads.  Do not cross your legs for long periods of time.  To prepare for the arrival of your baby: ? Take prenatal classes to understand, practice, and ask questions about labor and delivery. ? Make a trial run to the hospital. ? Visit the hospital and tour the maternity area. ? Arrange for maternity or paternity leave through employers. ? Arrange for family and friends to take care of pets while you are in the hospital. ? Purchase a rear-facing car seat and make sure you know how to install it in your car. ? Pack your hospital bag. ? Prepare the baby's nursery. Make sure to remove all pillows and stuffed animals from the baby's crib to prevent suffocation.  Visit your dentist if you have not gone during your pregnancy. Use a soft toothbrush to brush your teeth and be gentle when you floss. Contact a health care provider if:  You are unsure if you are in labor or if your water has broken.  You become dizzy.  You have mild pelvic cramps, pelvic pressure, or nagging pain in your abdominal area.  You have lower back pain.  You have persistent nausea, vomiting, or  diarrhea.  You have an unusual or bad smelling vaginal discharge.  You have pain when you urinate. Get help right away if:  Your water breaks before 37 weeks.  You have regular contractions less than 5 minutes apart before 37 weeks.  You have a fever.  You are leaking fluid from your vagina.  You have spotting or bleeding from your vagina.  You have severe abdominal pain or cramping.  You have rapid weight loss or weight gain.  You have   shortness of breath with chest pain.  You notice sudden or extreme swelling of your face, hands, ankles, feet, or legs.  Your baby makes fewer than 10 movements in 2 hours.  You have severe headaches that do not go away when you take medicine.  You have vision changes. Summary  The third trimester is from week 28 through week 40, months 7 through 9. The third trimester is a time when the unborn baby (fetus) is growing rapidly.  During the third trimester, your discomfort may increase as you and your baby continue to gain weight. You may have abdominal, leg, and back pain, sleeping problems, and an increased need to urinate.  During the third trimester your breasts will keep growing and they will continue to become tender. A yellow fluid (colostrum) may leak from your breasts. This is the first milk you are producing for your baby.  False labor is a condition in which you feel small, irregular tightenings of the muscles in the womb (contractions) that eventually go away. These are called Braxton Hicks contractions. Contractions may last for hours, days, or even weeks before true labor sets in.  Signs of labor can include: abdominal cramps; regular contractions that start at 10 minutes apart and become stronger and more frequent with time; watery or bloody mucus discharge that comes from the vagina; increased pelvic pressure and dull back pain; and leaking of amniotic fluid. This information is not intended to replace advice given to you by your  health care provider. Make sure you discuss any questions you have with your health care provider. Document Released: 01/14/2001 Document Revised: 02/26/2016 Document Reviewed: 02/26/2016 Elsevier Interactive Patient Education  2019 [employer]  

## 2018-02-01 NOTE — Progress Notes (Signed)
ROB TDAP n/v d/t being out of stock

## 2018-02-03 NOTE — L&D Delivery Note (Signed)
Late note entry  Called to bedside for precipitous birth of infant, infant crowning on arrival to room, RN in attendance at perineum. CNM delivered infant on 03/31/18 at 0541, vigorous at perineum and placed on maternal chest with nursery RN in attendance.  Intact perineum noted, scant EBL, placenta in situ. Dr Kenton Kingfisher arrived to room, completed delivery of placenta.   Francetta Found, CNM 05/17/2018

## 2018-02-03 NOTE — L&D Delivery Note (Signed)
Delivery Note Primary OB: [facility] Delivery Physician: Barnett Applebaum, MD    Asst: Hassan Buckler, CNM Gestational Age: Full term Antepartum complications: none Intrapartum complications: Precipitous labor and delivery  A viable Female was delivered via vertex perentation.  Apgars:8 ,9  Weight:  7 lb 10 oz .   Placenta status: spontaneous and Intact.  Cord: 3+ vessels;  with the following complications: nuchal.  Anesthesia:  none Episiotomy:  none Lacerations:  none Suture Repair: none Est. Blood Loss (mL):  less than 100 mL  Mom to postpartum.  Baby to Couplet care / Skin to Skin.  Barnett Applebaum, MD, [facility], [facility] 03/31/2018  6:05 AM [phone]

## 2018-02-15 ENCOUNTER — Ambulatory Visit (INDEPENDENT_AMBULATORY_CARE_PROVIDER_SITE_OTHER): Payer: Commercial Managed Care - PPO | Admitting: Obstetrics & Gynecology

## 2018-02-15 VITALS — BP 122/74 | Wt 158.0 lb

## 2018-02-15 DIAGNOSIS — Z3A32 32 weeks gestation of pregnancy: Secondary | ICD-10-CM

## 2018-02-15 DIAGNOSIS — Z3480 Encounter for supervision of other normal pregnancy, unspecified trimester: Secondary | ICD-10-CM

## 2018-02-15 DIAGNOSIS — O09893 Supervision of other high risk pregnancies, third trimester: Secondary | ICD-10-CM

## 2018-02-15 DIAGNOSIS — O09891 Supervision of other high risk pregnancies, first trimester: Secondary | ICD-10-CM

## 2018-02-15 LAB — POCT URINALYSIS DIPSTICK OB
Glucose, UA: NEGATIVE
POC,PROTEIN,UA: NEGATIVE

## 2018-02-15 NOTE — Progress Notes (Signed)
  Subjective  Fetal Movement? yes Contractions? Yes, irreg, lower abd pressure Leaking Fluid? No, some mucus discharge at irreg intervals Vaginal Bleeding? no  Objective  BP 122/74   Wt 158 lb (71.7 kg)   LMP 06/26/2017   BMI 25.50 kg/m  General: NAD Pumonary: no increased work of breathing Abdomen: gravid, non-tender Extremities: no edema Psychiatric: mood appropriate, affect full  Assessment  28 y.o. G3P2002 at [redacted]w[redacted]d by  04/12/2018, by Ultrasound presenting for routine prenatal visit  Plan   Problem List Items Addressed This Visit      Other   Supervision of other normal pregnancy, antepartum   Short interval between pregnancies affecting pregnancy in first trimester, antepartum    Other Visit Diagnoses    [redacted] weeks gestation of pregnancy    -  Primary   Relevant Orders   POC Urinalysis Dipstick OB (Completed)    No TDaP as last TDaP <1 year ago PNV, [place], PTL precautions  Barnett Applebaum, MD, [facility], [facility] 02/15/2018  9:24 AM

## 2018-02-15 NOTE — Patient Instructions (Signed)
Braxton Hicks Contractions Contractions of the uterus can occur throughout pregnancy, but they are not always a sign that you are in labor. You may have practice contractions called Braxton Hicks contractions. These false labor contractions are sometimes confused with true labor. What are Braxton Hicks contractions? Braxton Hicks contractions are tightening movements that occur in the muscles of the uterus before labor. Unlike true labor contractions, these contractions do not result in opening (dilation) and thinning of the cervix. Toward the end of pregnancy (32-34 weeks), Braxton Hicks contractions can happen more often and may become stronger. These contractions are sometimes difficult to tell apart from true labor because they can be very uncomfortable. You should not feel embarrassed if you go to the hospital with false labor. Sometimes, the only way to tell if you are in true labor is for your health care provider to look for changes in the cervix. The health care provider will do a physical exam and may monitor your contractions. If you are not in true labor, the exam should show that your cervix is not dilating and your water has not broken. If there are no other health problems associated with your pregnancy, it is completely safe for you to be sent home with false labor. You may continue to have Braxton Hicks contractions until you go into true labor. How to tell the difference between true labor and false labor True labor  Contractions last 30-70 seconds.  Contractions become very regular.  Discomfort is usually felt in the top of the uterus, and it spreads to the lower abdomen and low back.  Contractions do not go away with walking.  Contractions usually become more intense and increase in frequency.  The cervix dilates and gets thinner. False labor  Contractions are usually shorter and not as strong as true labor contractions.  Contractions are usually irregular.  Contractions  are often felt in the front of the lower abdomen and in the groin.  Contractions may go away when you walk around or change positions while lying down.  Contractions get weaker and are shorter-lasting as time goes on.  The cervix usually does not dilate or become thin. Follow these instructions at home:   Take over-the-counter and prescription medicines only as told by your health care provider.  Keep up with your usual exercises and follow other instructions from your health care provider.  Eat and drink lightly if you think you are going into labor.  If Braxton Hicks contractions are making you uncomfortable: ? Change your position from lying down or resting to walking, or change from walking to resting. ? Sit and rest in a tub of warm water. ? Drink enough fluid to keep your urine pale yellow. Dehydration may cause these contractions. ? Do slow and deep breathing several times an hour.  Keep all follow-up prenatal visits as told by your health care provider. This is important. Contact a health care provider if:  You have a fever.  You have continuous pain in your abdomen. Get help right away if:  Your contractions become stronger, more regular, and closer together.  You have fluid leaking or gushing from your vagina.  You pass blood-tinged mucus (bloody show).  You have bleeding from your vagina.  You have low back pain that you never had before.  You feel your baby's head pushing down and causing pelvic pressure.  Your baby is not moving inside you as much as it used to. Summary  Contractions that occur before labor are   called Braxton Hicks contractions, false labor, or practice contractions.  Braxton Hicks contractions are usually shorter, weaker, farther apart, and less regular than true labor contractions. True labor contractions usually become progressively stronger and regular, and they become more frequent.  Manage discomfort from Braxton Hicks contractions  by changing position, resting in a warm bath, drinking plenty of water, or practicing deep breathing. This information is not intended to replace advice given to you by your health care provider. Make sure you discuss any questions you have with your health care provider. Document Released: 06/05/2016 Document Revised: 11/04/2016 Document Reviewed: 06/05/2016 Elsevier Interactive Patient Education  2019 Elsevier Inc.  

## 2018-03-01 ENCOUNTER — Encounter: Payer: Self-pay | Admitting: Maternal Newborn

## 2018-03-01 ENCOUNTER — Ambulatory Visit (INDEPENDENT_AMBULATORY_CARE_PROVIDER_SITE_OTHER): Payer: Commercial Managed Care - PPO | Admitting: Maternal Newborn

## 2018-03-01 VITALS — BP 110/50 | Wt 161.0 lb

## 2018-03-01 DIAGNOSIS — Z3483 Encounter for supervision of other normal pregnancy, third trimester: Secondary | ICD-10-CM

## 2018-03-01 DIAGNOSIS — Z3480 Encounter for supervision of other normal pregnancy, unspecified trimester: Secondary | ICD-10-CM

## 2018-03-01 DIAGNOSIS — Z3A34 34 weeks gestation of pregnancy: Secondary | ICD-10-CM

## 2018-03-01 LAB — POCT URINALYSIS DIPSTICK OB: Glucose, UA: NEGATIVE

## 2018-03-01 NOTE — Patient Instructions (Signed)
Third Trimester of Pregnancy The third trimester is from week 28 through week 40 (months 7 through 9). The third trimester is a time when the unborn baby (fetus) is growing rapidly. At the end of the ninth month, the fetus is about 20 inches in length and weighs 6-10 pounds. Body changes during your third trimester Your body will continue to go through many changes during pregnancy. The changes vary from woman to woman. During the third trimester:  Your weight will continue to increase. You can expect to gain 25-35 pounds (11-16 kg) by the end of the pregnancy.  You may begin to get stretch marks on your hips, abdomen, and breasts.  You may urinate more often because the fetus is moving lower into your pelvis and pressing on your bladder.  You may develop or continue to have heartburn. This is caused by increased hormones that slow down muscles in the digestive tract.  You may develop or continue to have constipation because increased hormones slow digestion and cause the muscles that push waste through your intestines to relax.  You may develop hemorrhoids. These are swollen veins (varicose veins) in the rectum that can itch or be painful.  You may develop swollen, bulging veins (varicose veins) in your legs.  You may have increased body aches in the pelvis, back, or thighs. This is due to weight gain and increased hormones that are relaxing your joints.  You may have changes in your hair. These can include thickening of your hair, rapid growth, and changes in texture. Some women also have hair loss during or after pregnancy, or hair that feels dry or thin. Your hair will most likely return to normal after your baby is born.  Your breasts will continue to grow and they will continue to become tender. A yellow fluid (colostrum) may leak from your breasts. This is the first milk you are producing for your baby.  Your belly button may stick out.  You may notice more swelling in your hands,  face, or ankles.  You may have increased tingling or numbness in your hands, arms, and legs. The skin on your belly may also feel numb.  You may feel short of breath because of your expanding uterus.  You may have more problems sleeping. This can be caused by the size of your belly, increased need to urinate, and an increase in your body's metabolism.  You may notice the fetus "dropping," or moving lower in your abdomen (lightening).  You may have increased vaginal discharge.  You may notice your joints feel loose and you may have pain around your pelvic bone. What to expect at prenatal visits You will have prenatal exams every 2 weeks until week 36. Then you will have weekly prenatal exams. During a routine prenatal visit:  You will be weighed to make sure you and the baby are growing normally.  Your blood pressure will be taken.  Your abdomen will be measured to track your baby's growth.  The fetal heartbeat will be listened to.  Any test results from the previous visit will be discussed.  You may have a cervical check near your due date to see if your cervix has softened or thinned (effaced).  You will be tested for Group B streptococcus. This happens between 35 and 37 weeks. Your health care provider may ask you:  What your birth plan is.  How you are feeling.  If you are feeling the baby move.  If you have had any abnormal   symptoms, such as leaking fluid, bleeding, severe headaches, or abdominal cramping.  If you are using any tobacco products, including cigarettes, chewing tobacco, and electronic cigarettes.  If you have any questions. Other tests or screenings that may be performed during your third trimester include:  Blood tests that check for low iron levels (anemia).  Fetal testing to check the health, activity level, and growth of the fetus. Testing is done if you have certain medical conditions or if there are problems during the pregnancy.  Nonstress test  (NST). This test checks the health of your baby to make sure there are no signs of problems, such as the baby not getting enough oxygen. During this test, a belt is placed around your belly. The baby is made to move, and its heart rate is monitored during movement. What is false labor? False labor is a condition in which you feel small, irregular tightenings of the muscles in the womb (contractions) that usually go away with rest, changing position, or drinking water. These are called Braxton Hicks contractions. Contractions may last for hours, days, or even weeks before true labor sets in. If contractions come at regular intervals, become more frequent, increase in intensity, or become painful, you should see your health care provider. What are the signs of labor?  Abdominal cramps.  Regular contractions that start at 10 minutes apart and become stronger and more frequent with time.  Contractions that start on the top of the uterus and spread down to the lower abdomen and back.  Increased pelvic pressure and dull back pain.  A watery or bloody mucus discharge that comes from the vagina.  Leaking of amniotic fluid. This is also known as your "water breaking." It could be a slow trickle or a gush. Let your health care provider know if it has a color or strange odor. If you have any of these signs, call your health care provider right away, even if it is before your due date. Follow these instructions at home: Medicines  Follow your health care provider's instructions regarding medicine use. Specific medicines may be either safe or unsafe to take during pregnancy.  Take a prenatal vitamin that contains at least 600 micrograms (mcg) of folic acid.  If you develop constipation, try taking a stool softener if your health care provider approves. Eating and drinking   Eat a balanced diet that includes fresh fruits and vegetables, whole grains, good sources of protein such as meat, eggs, or tofu,  and low-fat dairy. Your health care provider will help you determine the amount of weight gain that is right for you.  Avoid raw meat and uncooked cheese. These carry germs that can cause birth defects in the baby.  If you have low calcium intake from food, talk to your health care provider about whether you should take a daily calcium supplement.  Eat four or five small meals rather than three large meals a day.  Limit foods that are high in fat and processed sugars, such as fried and sweet foods.  To prevent constipation: ? Drink enough fluid to keep your urine clear or pale yellow. ? Eat foods that are high in fiber, such as fresh fruits and vegetables, whole grains, and beans. Activity  Exercise only as directed by your health care provider. Most women can continue their usual exercise routine during pregnancy. Try to exercise for 30 minutes at least 5 days a week. Stop exercising if you experience uterine contractions.  Avoid heavy lifting.  Do   not exercise in extreme heat or humidity, or at high altitudes.  Wear low-heel, comfortable shoes.  Practice good posture.  You may continue to have sex unless your health care provider tells you otherwise. Relieving pain and discomfort  Take frequent breaks and rest with your legs elevated if you have leg cramps or low back pain.  Take warm sitz baths to soothe any pain or discomfort caused by hemorrhoids. Use hemorrhoid cream if your health care provider approves.  Wear a good support bra to prevent discomfort from breast tenderness.  If you develop varicose veins: ? Wear support pantyhose or compression stockings as told by your healthcare provider. ? Elevate your feet for 15 minutes, 3-4 times a day. Prenatal care  Write down your questions. Take them to your prenatal visits.  Keep all your prenatal visits as told by your health care provider. This is important. Safety  Wear your seat belt at all times when driving.  Make  a list of emergency phone numbers, including numbers for family, friends, the hospital, and police and fire departments. General instructions  Avoid cat litter boxes and soil used by cats. These carry germs that can cause birth defects in the baby. If you have a cat, ask someone to clean the litter box for you.  Do not travel far distances unless it is absolutely necessary and only with the approval of your health care provider.  Do not use hot tubs, steam rooms, or saunas.  Do not drink alcohol.  Do not use any products that contain nicotine or tobacco, such as cigarettes and e-cigarettes. If you need help quitting, ask your health care provider.  Do not use any medicinal herbs or unprescribed drugs. These chemicals affect the formation and growth of the baby.  Do not douche or use tampons or scented sanitary pads.  Do not cross your legs for long periods of time.  To prepare for the arrival of your baby: ? Take prenatal classes to understand, practice, and ask questions about labor and delivery. ? Make a trial run to the hospital. ? Visit the hospital and tour the maternity area. ? Arrange for maternity or paternity leave through employers. ? Arrange for family and friends to take care of pets while you are in the hospital. ? Purchase a rear-facing car seat and make sure you know how to install it in your car. ? Pack your hospital bag. ? Prepare the baby's nursery. Make sure to remove all pillows and stuffed animals from the baby's crib to prevent suffocation.  Visit your dentist if you have not gone during your pregnancy. Use a soft toothbrush to brush your teeth and be gentle when you floss. Contact a health care provider if:  You are unsure if you are in labor or if your water has broken.  You become dizzy.  You have mild pelvic cramps, pelvic pressure, or nagging pain in your abdominal area.  You have lower back pain.  You have persistent nausea, vomiting, or  diarrhea.  You have an unusual or bad smelling vaginal discharge.  You have pain when you urinate. Get help right away if:  Your water breaks before 37 weeks.  You have regular contractions less than 5 minutes apart before 37 weeks.  You have a fever.  You are leaking fluid from your vagina.  You have spotting or bleeding from your vagina.  You have severe abdominal pain or cramping.  You have rapid weight loss or weight gain.  You have   shortness of breath with chest pain.  You notice sudden or extreme swelling of your face, hands, ankles, feet, or legs.  Your baby makes fewer than 10 movements in 2 hours.  You have severe headaches that do not go away when you take medicine.  You have vision changes. Summary  The third trimester is from week 28 through week 40, months 7 through 9. The third trimester is a time when the unborn baby (fetus) is growing rapidly.  During the third trimester, your discomfort may increase as you and your baby continue to gain weight. You may have abdominal, leg, and back pain, sleeping problems, and an increased need to urinate.  During the third trimester your breasts will keep growing and they will continue to become tender. A yellow fluid (colostrum) may leak from your breasts. This is the first milk you are producing for your baby.  False labor is a condition in which you feel small, irregular tightenings of the muscles in the womb (contractions) that eventually go away. These are called Braxton Hicks contractions. Contractions may last for hours, days, or even weeks before true labor sets in.  Signs of labor can include: abdominal cramps; regular contractions that start at 10 minutes apart and become stronger and more frequent with time; watery or bloody mucus discharge that comes from the vagina; increased pelvic pressure and dull back pain; and leaking of amniotic fluid. This information is not intended to replace advice given to you by your  health care provider. Make sure you discuss any questions you have with your health care provider. Document Released: 01/14/2001 Document Revised: 02/26/2016 Document Reviewed: 02/26/2016 Elsevier Interactive Patient Education  2019 [employer]  

## 2018-03-01 NOTE — Progress Notes (Signed)
Routine Prenatal Care Visit  Subjective  Valerie Gardner is a 28 y.o. G3P2002 at [redacted]w[redacted]d being seen today for ongoing prenatal care.  She is currently monitored for the following issues for this low-risk pregnancy and has Hyperandrogenemia; Supervision of other normal pregnancy, antepartum; Short interval between pregnancies affecting pregnancy in first trimester, antepartum; and Pregnancy related nausea, antepartum on their problem list.  ----------------------------------------------------------------------------------- Patient reports pelvic girdle pain.   Contractions: Not present. Vag. Bleeding: None.  Movement: Present. No leaking of fluid.  ----------------------------------------------------------------------------------- The following portions of the patient's history were reviewed and updated as appropriate: allergies, current medications, past family history, past medical history, past social history, past surgical history and problem list. Problem list updated.  Objective  Blood pressure (!) 110/50, weight 161 lb (73 kg), last menstrual period 06/26/2017, unknown if currently breastfeeding. Pregravid weight 135 lb (61.2 kg) Total Weight Gain 26 lb (11.8 kg) Body mass index is 25.99 kg/m.   Urinalysis: Urine dipstick shows negative for glucose, positive for protein (trace).  Fetal Status: Fetal Heart Rate (bpm): 148 Fundal Height: 34 cm Movement: Present     General:  Alert, oriented and cooperative. Patient is in no acute distress.  Skin: Skin is warm and dry. No rash noted.   Cardiovascular: Normal heart rate noted  Respiratory: Normal respiratory effort, no problems with respiration noted  Abdomen: Soft, gravid, appropriate for gestational age. Pain/Pressure: Present     Pelvic:  Cervical exam deferred        Extremities: Normal range of motion.  Edema: None  Mental Status: Normal mood and affect. Normal behavior. Normal judgment and thought content.      Assessment   28 y.o. [id number] at [redacted]w[redacted]d, EDD 04/12/2018 by Ultrasound presenting for a routine prenatal visit.  Plan   THIRD Problems (from 08/12/17 to present)    Problem Noted Resolved   Supervision of other normal pregnancy, antepartum 08/12/2017 by Rexene Agent, CNM No   Overview Addendum 12/07/2017  9:01 AM by Malachy Mood, MD    Clinic [facility] Prenatal Labs  Dating [place] changes [facility] 04/12/18 Blood type: A/Positive/-- (07/10 1110)   Genetic Screen NIPS:normal XX Antibody:Negative (07/10 1110)  Anatomic [place] Normal Female Rubella: 2.25 (07/10 1110) Varicella: Immune  GTT Early:na   Third trimester:  RPR: Non Reactive (07/10 1110)   Rhogam na HBsAg: Negative (07/10 1110)   TDaP vaccine  03/19/17   Flu Shot: declines HIV: Non Reactive (07/10 1110)   Baby Food  Formula                              GBS:  Contraception  Unsure, considering vasectomy, used OCP in past Pap:09/2016 normal; repeat PP  CBB   Pelvis tested to 7lbs 5oz  CS/VBAC na   Support Person [place] interval between pregnancies affecting pregnancy in first trimester, antepartum 08/12/2017 by Rexene Agent, CNM No    Discussed comfort measures for pelvic girdle pain such as maternity support belt, exercise ball, Tylenol. Offered referral to pelvic floor PT, she wants to try other measures at this time.  Preterm labor symptoms and general obstetric precautions including but not limited to vaginal bleeding, contractions, leaking of fluid and fetal movement were reviewed.  Please refer to After Visit Summary for other counseling recommendations.   Return in about 2 weeks (around 03/15/2018).  Avel Sensor, CNM  03/01/2018  9:04 AM

## 2018-03-01 NOTE — Progress Notes (Signed)
ROB C/o pelvic pain Good FM, no lof, no spotting

## 2018-03-07 ENCOUNTER — Other Ambulatory Visit: Payer: Self-pay

## 2018-03-07 ENCOUNTER — Observation Stay
Admission: EM | Admit: 2018-03-07 | Discharge: 2018-03-07 | Disposition: A | Discharge status: Home / Self Care | Payer: Commercial Managed Care - PPO | Attending: Obstetrics & Gynecology | Admitting: Obstetrics & Gynecology

## 2018-03-07 DIAGNOSIS — O26893 Other specified pregnancy related conditions, third trimester: Secondary | ICD-10-CM | POA: Diagnosis not present

## 2018-03-07 DIAGNOSIS — Z3A34 34 weeks gestation of pregnancy: Secondary | ICD-10-CM | POA: Insufficient documentation

## 2018-03-07 DIAGNOSIS — R319 Hematuria, unspecified: Secondary | ICD-10-CM | POA: Diagnosis not present

## 2018-03-07 DIAGNOSIS — Z3490 Encounter for supervision of normal pregnancy, unspecified, unspecified trimester: Secondary | ICD-10-CM

## 2018-03-07 LAB — URINALYSIS, ROUTINE W REFLEX MICROSCOPIC
Bacteria, UA: NONE SEEN
Bilirubin Urine: NEGATIVE
Glucose, UA: NEGATIVE mg/dL
Ketones, ur: NEGATIVE mg/dL
Leukocytes, UA: NEGATIVE
NITRITE: NEGATIVE
Protein, ur: NEGATIVE mg/dL
RBC / HPF: >50 RBC/hpf — ABNORMAL HIGH (ref 0–5)
SPECIFIC GRAVITY, URINE: 1.021 (ref 1.005–1.030)
pH: 6 (ref 5.0–8.0)

## 2018-03-07 NOTE — Discharge Summary (Signed)
See FPN

## 2018-03-07 NOTE — Final Progress Note (Signed)
Physician Final Progress Note  Patient ID: Valerie Gardner MRN: [id number] DOB/AGE: [birth year] 28 y.o.  Admit date: 03/07/2018 Admitting provider: Gae Dry, MD Discharge date: 03/07/2018  Admission Diagnoses: Hematuria, 28 weeks pregnancy  Discharge Diagnoses:  Active Problems:   Pregnancy at 34 weeks   Hematuria  Consults: None  Significant Findings/ Diagnostic Studies: Patient presented for evaluation of labor.  Patient had cervical exam by RN and this was reported to me. I reviewed her vital signs and fetal tracing, both of which were reassuring.  Patient was discharge as she was not laboring. Pt noted to have hematuria on UA, no infection, ketone or other concerns.  Procedures: A NST procedure was performed with FHR monitoring and a normal baseline established, appropriate time of 20-40 minutes of evaluation, and accels >2 seen w 15x15 characteristics.  Results show a REACTIVE NST.   Results for orders placed or performed during the hospital encounter of 03/07/18  Urinalysis, Routine w reflex microscopic  Result Value Ref Range   Color, Urine YELLOW (A) YELLOW   APPearance CLEAR (A) CLEAR   Specific Gravity, Urine 1.021 1.005 - 1.030   pH 6.0 5.0 - 8.0   Glucose, UA NEGATIVE NEGATIVE mg/dL   Hgb urine dipstick MODERATE (A) NEGATIVE   Bilirubin Urine NEGATIVE NEGATIVE   Ketones, ur NEGATIVE NEGATIVE mg/dL   Protein, ur NEGATIVE NEGATIVE mg/dL   Nitrite NEGATIVE NEGATIVE   Leukocytes, UA NEGATIVE NEGATIVE   RBC / HPF >50 (H) 0 - 5 RBC/hpf   WBC, UA 0-5 0 - 5 WBC/hpf   Bacteria, UA NONE SEEN NONE SEEN   Squamous Epithelial / LPF 6-10 0 - 5   Mucus PRESENT    Discharge Condition: good  Disposition: Home Follow up office Monday  Diet: Regular diet  Discharge Activity: Activity as tolerated   Allergies as of 03/07/2018   No Known Allergies     Medication List    ASK your doctor about these medications   CITRANATAL BLOOM 90-1 MG Tabs Take 1 tablet by  mouth daily.      Total time spent taking care of this patient: TRIAGE  Signed: Hoyt Koch 03/07/2018, 12:55 PM

## 2018-03-07 NOTE — OB Triage Note (Signed)
Pt c/o blood in urine since 2200 03/06/2018,  Nausea,  feeling tired, and round ligament pain.  No LOF.  Pt reports normal fetal movement.

## 2018-03-07 NOTE — Discharge Instructions (Signed)
Pregnancy and Urinary Tract Infection What is a urinary tract infection?  A urinary tract infection (UTI) is an infection of any part of the urinary tract. This includes the kidneys, the tubes that connect your kidneys to your bladder (ureters), the bladder, and the tube that carries urine out of your body (urethra). These organs make, store, and get rid of urine in the body.  An upper UTI affects the ureters and kidneys (pyelonephritis), and a lower UTI affects the bladder (cystitis) and urethra (urethritis). Most urinary tract infections are caused by bacteria in your genital area, around the entrance to your urinary tract (urethra). These bacteria grow and cause irritation and inflammation of your urinary tract. Why am I more likely to get a UTI during pregnancy? You are more likely to develop a UTI during pregnancy because:  The physical and hormonal changes your body goes through can make it easier for bacteria to get into your urinary tract.  Your growing baby puts pressure on your uterus and can affect urine flow. Does a UTI place my baby at risk? An untreated UTI during pregnancy could lead to a kidney infection, which can cause health problems that could affect your baby. Possible complications of an untreated UTI include:  Having your baby before 37 weeks of pregnancy (premature).  Having a baby with a low birth weight.  Developing high blood pressure during pregnancy (preeclampsia).  Having a low hemoglobin level (anemia). What are the symptoms of a UTI? Symptoms of a UTI include:  Needing to urinate right away (urgently).  Frequent urination or passing small amounts of urine frequently.  Pain or burning with urination.  Blood in the urine.  Urine that smells bad or unusual.  Trouble urinating.  Cloudy urine.  Pain in the abdomen or lower back.  Vaginal discharge. You may also have:  Vomiting or a decreased appetite.  Confusion.  Irritability or  tiredness.  A fever.  Diarrhea. What are the treatment options for a UTI during pregnancy? Treatment for this condition may include:  Antibiotic medicines that are safe to take during pregnancy.  Other medicines to treat less common causes of UTI. How can I prevent a UTI? To prevent a UTI:  Go to the bathroom as soon as you feel the need. Do not hold urine for long periods of time.  Always wipe from front to back after a bowel movement. Use each tissue one time when you wipe.  Empty your bladder after sex.  Keep your genital area dry.  Drink 6-10 glasses of water each day.  Do not douche or use deodorant sprays. Contact a health care provider if:  Your symptoms do not improve or they get worse.  You have abnormal vaginal discharge. Get help right away if:  You have a fever.  You have nausea and vomiting.  You have back or side pain.  You feel contractions in your uterus.  You have lower belly pain.  You have a gush of fluid from your vagina.  You have blood in your urine. Summary  A urinary tract infection (UTI) is an infection of any part of the urinary tract, which includes the kidneys, ureters, bladder, and urethra.  Most urinary tract infections are caused by bacteria in your genital area, around the entrance to your urinary tract (urethra).  You are more likely to develop a UTI during pregnancy.  If you were prescribed an antibiotic, take it as told by your health care provider. Do not stop taking the  bladder, and urethra.  · Most urinary tract infections are caused by bacteria in your genital area, around the entrance to your urinary tract (urethra).  · You are more likely to develop a UTI during pregnancy.  · If you were prescribed an antibiotic, take it as told by your health care provider. Do not stop taking the antibiotic even if you start to feel better.  This information is not intended to replace advice given to you by your health care provider. Make sure you discuss any questions you have with your health care provider.  Document Released: 05/17/2010 Document Revised: 03/17/2017 Document Reviewed: 12/11/2014  Elsevier Interactive Patient Education © 2019 Elsevier Inc.

## 2018-03-13 ENCOUNTER — Other Ambulatory Visit: Payer: Self-pay

## 2018-03-13 ENCOUNTER — Observation Stay
Admission: EM | Admit: 2018-03-13 | Discharge: 2018-03-14 | Disposition: A | Discharge status: Home / Self Care | Payer: Commercial Managed Care - PPO | Attending: Obstetrics and Gynecology | Admitting: Obstetrics and Gynecology

## 2018-03-13 ENCOUNTER — Observation Stay: Payer: Commercial Managed Care - PPO

## 2018-03-13 DIAGNOSIS — O212 Late vomiting of pregnancy: Secondary | ICD-10-CM | POA: Insufficient documentation

## 2018-03-13 DIAGNOSIS — Z3A35 35 weeks gestation of pregnancy: Secondary | ICD-10-CM

## 2018-03-13 DIAGNOSIS — N1339 Other hydronephrosis: Secondary | ICD-10-CM | POA: Diagnosis not present

## 2018-03-13 DIAGNOSIS — R319 Hematuria, unspecified: Secondary | ICD-10-CM

## 2018-03-13 DIAGNOSIS — M545 Low back pain: Secondary | ICD-10-CM

## 2018-03-13 DIAGNOSIS — O26893 Other specified pregnancy related conditions, third trimester: Principal | ICD-10-CM | POA: Insufficient documentation

## 2018-03-13 DIAGNOSIS — Z87891 Personal history of nicotine dependence: Secondary | ICD-10-CM | POA: Insufficient documentation

## 2018-03-13 DIAGNOSIS — Z3480 Encounter for supervision of other normal pregnancy, unspecified trimester: Secondary | ICD-10-CM

## 2018-03-13 DIAGNOSIS — E282 Polycystic ovarian syndrome: Secondary | ICD-10-CM | POA: Insufficient documentation

## 2018-03-13 DIAGNOSIS — O09891 Supervision of other high risk pregnancies, first trimester: Secondary | ICD-10-CM

## 2018-03-13 LAB — CBC
HCT: 31.1 % — ABNORMAL LOW (ref 36.0–46.0)
Hemoglobin: 9.3 g/dL — ABNORMAL LOW (ref 12.0–15.0)
MCH: 23.7 pg — ABNORMAL LOW (ref 26.0–34.0)
MCHC: 29.9 g/dL — ABNORMAL LOW (ref 30.0–36.0)
MCV: 79.1 fL — ABNORMAL LOW (ref 80.0–100.0)
PLATELETS: 211 10*3/uL (ref 150–400)
RBC: 3.93 MIL/uL (ref 3.87–5.11)
RDW: 16.1 % — ABNORMAL HIGH (ref 11.5–15.5)
WBC: 13.2 10*3/uL — ABNORMAL HIGH (ref 4.0–10.5)
nRBC: 0 % (ref 0.0–0.2)

## 2018-03-13 LAB — GROUP B STREP BY PCR: Group B strep by PCR: NEGATIVE

## 2018-03-13 LAB — COMPREHENSIVE METABOLIC PANEL
ALT: 12 U/L (ref 0–44)
AST: 22 U/L (ref 15–41)
Albumin: 3.4 g/dL — ABNORMAL LOW (ref 3.5–5.0)
Alkaline Phosphatase: 140 U/L — ABNORMAL HIGH (ref 38–126)
Anion gap: 10 (ref 5–15)
BUN: 7 mg/dL (ref 6–20)
CHLORIDE: 107 mmol/L (ref 98–111)
CO2: 21 mmol/L — ABNORMAL LOW (ref 22–32)
Calcium: 8 mg/dL — ABNORMAL LOW (ref 8.9–10.3)
Creatinine, Ser: 0.55 mg/dL (ref 0.44–1.00)
GFR calc non Af Amer: >60 mL/min (ref >60)
Glucose, Bld: 85 mg/dL (ref 70–99)
Potassium: 3.9 mmol/L (ref 3.5–5.1)
Sodium: 138 mmol/L (ref 135–145)
Total Bilirubin: 0.8 mg/dL (ref 0.3–1.2)
Total Protein: 7.1 g/dL (ref 6.5–8.1)

## 2018-03-13 LAB — URINALYSIS, COMPLETE (UACMP) WITH MICROSCOPIC
BILIRUBIN URINE: NEGATIVE
Glucose, UA: NEGATIVE mg/dL
Ketones, ur: 80 mg/dL — AB
Nitrite: NEGATIVE
Protein, ur: 30 mg/dL — AB
RBC / HPF: >50 RBC/hpf — ABNORMAL HIGH (ref 0–5)
Specific Gravity, Urine: 1.016 (ref 1.005–1.030)
pH: 6 (ref 5.0–8.0)

## 2018-03-13 MED ORDER — SODIUM CHLORIDE 0.9 % IV SOLN: 8.0000 mg | Freq: Three times a day (TID) | INTRAVENOUS | Status: DC | PRN

## 2018-03-13 MED ORDER — SODIUM CHLORIDE 0.9 % IV SOLN
8.0000 mg | Freq: Three times a day (TID) | INTRAVENOUS | Status: DC | PRN
  Administered 2018-03-13: 8 mg via INTRAVENOUS
  Filled 2018-03-13 (×2): qty 4

## 2018-03-13 MED ORDER — ONDANSETRON 4 MG PO TBDP
8.0000 mg | ORAL_TABLET | ORAL | Status: AC
  Administered 2018-03-13: 8 mg via ORAL
  Filled 2018-03-13: qty 2

## 2018-03-13 MED ORDER — LACTATED RINGERS IV SOLN
INTRAVENOUS | Status: DC
  Administered 2018-03-13: 21:00:00 via INTRAVENOUS

## 2018-03-13 MED ORDER — LACTATED RINGERS IV BOLUS
1000.0000 mL | Freq: Once | INTRAVENOUS | Status: AC
  Administered 2018-03-13: 1000 mL via INTRAVENOUS

## 2018-03-13 NOTE — OB Triage Note (Signed)
Pt. presented to L/D with N/V for two hours. Vomiting is unrelieved with rest. She also reports left-sided aching back pain that ranged from a 3 to 7/10. She reports a possible gush of fluid but no bleeding. No urinary symptoms. Positive fetal movement. VSS. Patient stable at this time. Will continue to monitor.

## 2018-03-13 NOTE — Discharge Summary (Signed)
Physician Discharge Summary  Patient ID: Valerie Gardner MRN: [id number] DOB/AGE: [birth year] 28 y.o.  Admit date: 03/13/2018 Admitting provider: Will Bonnet, MD Discharge date: 03/14/2018   Admission Diagnoses:  1) intrauterine pregnancy at [redacted]w[redacted]d  2) lower back pain in pregnancy 3) nausea and vomiting in pregnancy  Discharge Diagnoses:  1) intrauterine pregnancy at [redacted]w[redacted]d  2) lower back pain in pregnancy, suspect ureterolithiasis 3) nausea and vomiting in pregnancy  History of Present Illness: The patient is a 28 y.o. female G3P2002 at [redacted]w[redacted]d who presents for acute-onset left lower back pain.  It started suddenly this afternoon.  It was immediately associated with nausea and emesis and she has not been able to keep anything at all down since that time.  She notes +FM, no LOF, no vaginal bleeding. She denies contractions. She states her back pain is constant, but the pain does wax and wane in intensity.  The pain does not radiate. At worst the pain is a 7/10. Currently it is quite low (1/10).  She has vomited several times since here. She has tried nothing for her pain.  Nothing seems to make it worse.  She notes no fevers, chills, blood in her urine. No diarrhea.  She denies sick contacts. She was seen last week for blood in her urine and was discharged with precautions.    Past Medical History:  Diagnosis Date  . [redacted] weeks gestation of pregnancy 04/02/2017  . PCOS (polycystic ovarian syndrome)     Past Surgical History:  Procedure Laterality Date  . NO PAST SURGERIES      No current facility-administered medications on file prior to encounter.    Current Outpatient Medications on File Prior to Encounter  Medication Sig Dispense Refill  . Prenatal-DSS-FeCb-FeGl-FA (CITRANATAL BLOOM) 90-1 MG TABS Take 1 tablet by mouth daily. (Patient not taking: Reported on 03/07/2018) 30 tablet 2    No Known Allergies  Social History   Socioeconomic History  . Marital status: Married     Spouse name: Valerie Gardner  . Number of children: 2  . Years of education: 35  . Highest education level: Not on file  Occupational History  . Occupation: STAY AT HOME MOM  Social Needs  . Financial resource strain: Not hard at all  . Food insecurity:    Worry: Never true    Inability: Never true  . Transportation needs:    Medical: No    Non-medical: No  Tobacco Use  . Smoking status: Former Research scientist (life sciences)  . Smokeless tobacco: Never Used  Substance and Sexual Activity  . Alcohol use: No  . Drug use: No  . Sexual activity: Yes    Birth control/protection: None  Lifestyle  . Physical activity:    Days per week: 0 days    Minutes per session: Not on file  . Stress: To some extent  Relationships  . Social connections:    Talks on phone: More than three times a week    Gets together: More than three times a week    Attends religious service: Never    Active member of club or organization: No    Attends meetings of clubs or organizations: Never    Relationship status: Married  . Intimate partner violence:    Fear of current or ex partner: Not on file    Emotionally abused: Not on file    Physically abused: Not on file    Forced sexual activity: Not on file  Other Topics Concern  . Not on  file  Social History Narrative  . Not on file    Family History  Problem Relation Age of Onset  . Diabetes Mellitus I Sister   . Melanoma Maternal Grandmother   . Diabetes Mellitus II Paternal Grandmother   . COPD Paternal Grandmother   . Hypertension Mother   . Hypertension Father      Review of Systems  Constitutional: Negative.  Negative for chills and fever.  HENT: Negative.   Eyes: Negative.   Respiratory: Negative.   Cardiovascular: Negative.   Gastrointestinal: Positive for nausea and vomiting. Negative for abdominal pain, blood in stool, constipation, diarrhea and melena.  Genitourinary: Positive for flank pain (left ). Negative for dysuria, frequency, hematuria and urgency.   Skin: Negative.   Neurological: Negative.   Psychiatric/Behavioral: Negative.      Physical Exam: BP (!) 105/56   Pulse 76   Temp 98.4 F (36.9 C) (Oral)   Resp 18   Ht 5' 6.5" (1.689 m)   Wt 72.6 kg   LMP 06/26/2017   BMI 25.44 kg/m   Physical Exam Constitutional:      General: She is not in acute distress.    Appearance: Normal appearance.  HENT:     Head: Normocephalic and atraumatic.  Eyes:     General: No scleral icterus.    Conjunctiva/sclera: Conjunctivae normal.  Cardiovascular:     Rate and Rhythm: Normal rate and regular rhythm.  Pulmonary:     Effort: Pulmonary effort is normal. No respiratory distress.  Abdominal:     Palpations: Abdomen is soft.     Comments: Gravid/NT  Musculoskeletal: Normal range of motion.        General: No swelling.  Neurological:     General: No focal deficit present.     Mental Status: She is alert and oriented to person, place, and time.     Cranial Nerves: No cranial nerve deficit.  Skin:    General: Skin is warm and dry.     Coloration: Skin is pale.  Psychiatric:        Mood and Affect: Mood normal.        Behavior: Behavior normal.        Judgment: Judgment normal.     Consults: None  Significant Findings/ Diagnostic Studies:  Imaging Results US Renal  Result Date: 03/13/2018 CLINICAL DATA:  28 year old pregnant female at [redacted] weeks gestation, presenting with recent hematuria and left-sided pain. EXAM: RENAL / URINARY TRACT ULTRASOUND COMPLETE COMPARISON:  None. FINDINGS: Right Kidney: Renal measurements: 10.6 x 4.5 x 5.9 cm = volume: 146 mL. Normal renal parenchymal echogenicity and thickness. Mild right pelvicaliectasis. No renal mass. Left Kidney: Renal measurements: 11.2 x 5.7 x 5.9 cm = volume: 197 mL. Normal renal parenchymal echogenicity and thickness. Moderate left hydronephrosis. No renal mass. Bladder: Appears normal for degree of bladder distention. IMPRESSION: 1. Moderate left hydronephrosis, suspicious for  left urinary tract obstruction at the level of the left ureter. 2. Mild right pelvocaliectasis, within normal limits at this gestational age. 3. Normal bladder. Electronically Signed   By: Ilona Sorrel M.D.   On: 03/13/2018 22:33    Lab Results  Component Value Date   APPEARANCEUR HAZY (A) 03/13/2018   GLUCOSEU NEGATIVE 03/13/2018   BILIRUBINUR NEGATIVE 03/13/2018   KETONESUR 80 (A) 03/13/2018   LABSPEC 1.016 03/13/2018   HGBUR MODERATE (A) 03/13/2018   PHURINE 6.0 03/13/2018   NITRITE NEGATIVE 03/13/2018   LEUKOCYTESUR TRACE (A) 03/13/2018   RBCU >50 (H) 03/13/2018  WBCU 0-5 03/13/2018   BACTERIA FEW (A) 03/13/2018   EPIU 11-20 03/13/2018     Procedures: NST Baseline FHR: 150 beats/min Variability: moderate Accelerations: present Decelerations: absent Tocometry: occasional  Interpretation:  INDICATIONS: rule out uterine contractions RESULTS:  A NST procedure was performed with FHR monitoring and a normal baseline established, appropriate time of 20-40 minutes of evaluation, and accels >2 seen w 15x15 characteristics.  Results show a REACTIVE NST.    Hospital Course: The patient was admitted to Labor and Delivery Triage for observation. She had a U/A, which was suggestive of possible ureteronephrolithiasis. She underwent a renal ultrasound showing left moderate hydronephrosis with no obvious stone, but suggestive of a distal partially-obstructing stone.  She was treated with IV fluids and anti-emetics.  She was eventually able to tolerate PO and desired discharge. She was instructed to keep her follow up appointment for 03/15/2018. The fetal tracing was normal, she had normal vital signs.  So, she was considered stable for discharge.  Discharge Condition: stable  Disposition: Discharge disposition: 01-Home or Self Care     home  Diet: Regular diet  Discharge Activity: Activity as tolerated   Allergies as of 03/14/2018   No Known Allergies     Medication List    TAKE  these medications   CITRANATAL BLOOM 90-1 MG Tabs Take 1 tablet by mouth daily.      [place]. Go on 03/15/2018.   Specialty:  Obstetrics and Gynecology Contact information: [address] [place] [id number] [phone]          Total time spent taking care of this patient: 45 minutes  Signed: Prentice Docker, MD  03/14/2018, 3:21 AM

## 2018-03-14 DIAGNOSIS — O26893 Other specified pregnancy related conditions, third trimester: Secondary | ICD-10-CM | POA: Diagnosis not present

## 2018-03-14 DIAGNOSIS — Z87891 Personal history of nicotine dependence: Secondary | ICD-10-CM | POA: Diagnosis not present

## 2018-03-14 DIAGNOSIS — M545 Low back pain: Secondary | ICD-10-CM | POA: Diagnosis not present

## 2018-03-15 ENCOUNTER — Ambulatory Visit (INDEPENDENT_AMBULATORY_CARE_PROVIDER_SITE_OTHER): Payer: Commercial Managed Care - PPO | Admitting: Certified Nurse Midwife

## 2018-03-15 VITALS — BP 118/78 | Wt 166.0 lb

## 2018-03-15 DIAGNOSIS — Z3480 Encounter for supervision of other normal pregnancy, unspecified trimester: Secondary | ICD-10-CM

## 2018-03-15 DIAGNOSIS — Z3483 Encounter for supervision of other normal pregnancy, third trimester: Secondary | ICD-10-CM

## 2018-03-15 DIAGNOSIS — Z3A36 36 weeks gestation of pregnancy: Secondary | ICD-10-CM

## 2018-03-15 LAB — POCT URINALYSIS DIPSTICK OB: Glucose, UA: NEGATIVE

## 2018-03-15 NOTE — Progress Notes (Signed)
ROB GBS done at L&D

## 2018-03-15 NOTE — Progress Notes (Signed)
ROB at 36 weeks: seen in L&D twice since 03/07/2018-for hematuria and then left flank pain. Has left hydroureter ? Due to stone. Flank pain still present, but not as bad.  Baby active. More frequent contractions, but still every 10-30 min apart. NO vaginal bleeding or leakage of fluid. GBS done in L&D and was neg Cervix: FT/ Thick/ -2/ vertex Labor precautions. RTO in 1 week  Dalia Heading, CNM

## 2018-03-22 ENCOUNTER — Ambulatory Visit (INDEPENDENT_AMBULATORY_CARE_PROVIDER_SITE_OTHER): Payer: Commercial Managed Care - PPO | Admitting: Advanced Practice Midwife

## 2018-03-22 ENCOUNTER — Encounter: Payer: Self-pay | Admitting: Advanced Practice Midwife

## 2018-03-22 VITALS — BP 138/72 | Wt 165.0 lb

## 2018-03-22 DIAGNOSIS — Z3A37 37 weeks gestation of pregnancy: Secondary | ICD-10-CM

## 2018-03-22 LAB — POCT URINALYSIS DIPSTICK OB
Glucose, UA: NEGATIVE
POC,PROTEIN,UA: NEGATIVE

## 2018-03-22 NOTE — Progress Notes (Signed)
ROB Vaginal pain/left side pain

## 2018-03-22 NOTE — Patient Instructions (Signed)

## 2018-03-22 NOTE — Progress Notes (Signed)
Routine Prenatal Care Visit  Subjective  Valerie Gardner is a 28 y.o. G3P2002 at [redacted]w[redacted]d being seen today for ongoing prenatal care.  She is currently monitored for the following issues for this low-risk pregnancy and has Hyperandrogenemia; Supervision of other normal pregnancy, antepartum; Short interval between pregnancies affecting pregnancy in first trimester, antepartum; Pregnancy related nausea, antepartum; Pregnancy; and Labor and delivery, indication for care on their problem list.  ----------------------------------------------------------------------------------- Patient reports having some left flank pain over the weekend which improved. She had some menstrual like cramping with increased pelvic pressure at the same time and requests cervical exam. She denies any headache, visual changes or epigastric pain.    Contractions: Irregular. Vag. Bleeding: None.  Movement: Present. Denies leaking of fluid.  ----------------------------------------------------------------------------------- The following portions of the patient's history were reviewed and updated as appropriate: allergies, current medications, past family history, past medical history, past social history, past surgical history and problem list. Problem list updated.   Objective  Blood pressure 138/72, weight 165 lb (74.8 kg), last menstrual period 06/26/2017 Pregravid weight 135 lb (61.2 kg) Total Weight Gain 30 lb (13.6 kg) Urinalysis: Urine Protein Negative  Urine Glucose Negative  Fetal Status: Fetal Heart Rate (bpm): 136 Fundal Height: 37 cm Movement: Present  Presentation: Vertex  General:  Alert, oriented and cooperative. Patient is in no acute distress.  Skin: Skin is warm and dry. No rash noted.   Cardiovascular: Normal heart rate noted  Respiratory: Normal respiratory effort, no problems with respiration noted  Abdomen: Soft, gravid, appropriate for gestational age. Pain/Pressure: Present     Pelvic:   Cervical exam performed Dilation: 1 Effacement (%): 50 Station: -2  Extremities: Normal range of motion.  Edema: None  Mental Status: Normal mood and affect. Normal behavior. Normal judgment and thought content.   Assessment   28 y.o. G3P2002 at [redacted]w[redacted]d by  04/12/2018, by Ultrasound presenting for routine prenatal visit  Plan   THIRD Problems (from 08/12/17 to present)    Problem Noted Resolved   Supervision of other normal pregnancy, antepartum 08/12/2017 by Rexene Agent, CNM No   Overview Addendum 12/07/2017  9:01 AM by Malachy Mood, MD    [facility] Prenatal Labs  Dating [place] changes [facility] 04/12/18 Blood type: A/Positive/-- (07/10 1110)   Genetic Screen NIPS:normal XX Antibody:Negative (07/10 1110)  Anatomic [place] Normal Female Rubella: 2.25 (07/10 1110) Varicella: Immune  GTT Early:na   Third trimester:  RPR: Non Reactive (07/10 1110)   Rhogam na HBsAg: Negative (07/10 1110)   TDaP vaccine  03/19/17   Flu Shot: declines HIV: Non Reactive (07/10 1110)   Baby Food  Formula                              GBS:  Contraception  Unsure, considering vasectomy, used OCP in past Pap:09/2016 normal; repeat PP  CBB   Pelvis tested to 7lbs 5oz  CS/VBAC na   Support Person Gilford Raid interval between pregnancies affecting pregnancy in first trimester, antepartum 08/12/2017 by Rexene Agent, CNM No       Preterm labor symptoms and general obstetric precautions including but not limited to vaginal bleeding, contractions, leaking of fluid and fetal movement were reviewed in detail with the patient. Please refer to After Visit Summary for other counseling recommendations.   Return in about 1 week (around 03/29/2018) for rob.  Rod Can,  CNM 03/22/2018 8:33 AM

## 2018-03-29 ENCOUNTER — Ambulatory Visit (INDEPENDENT_AMBULATORY_CARE_PROVIDER_SITE_OTHER): Payer: Commercial Managed Care - PPO | Admitting: Advanced Practice Midwife

## 2018-03-29 ENCOUNTER — Encounter: Payer: Self-pay | Admitting: Advanced Practice Midwife

## 2018-03-29 VITALS — BP 114/68 | Wt 161.0 lb

## 2018-03-29 DIAGNOSIS — Z3A38 38 weeks gestation of pregnancy: Secondary | ICD-10-CM

## 2018-03-29 NOTE — Progress Notes (Signed)
ROB C/o pelvic pain and pressure, contractions yesterday 10 min apart for 9 hrs Desires cervical check

## 2018-03-29 NOTE — Progress Notes (Signed)
  Routine Prenatal Care Visit  Subjective  Valerie Gardner is a 28 y.o. G3P2002 at [redacted]w[redacted]d being seen today for ongoing prenatal care.  She is currently monitored for the following issues for this low-risk pregnancy and has Hyperandrogenemia; Supervision of other normal pregnancy, antepartum; Short interval between pregnancies affecting pregnancy in first trimester, antepartum; Pregnancy related nausea, antepartum; Pregnancy; and Labor and delivery, indication for care on their problem list.  ----------------------------------------------------------------------------------- Patient reports regular contractions yesterday throughout the day lasting 30-45 seconds.   Contractions: Regular. Vag. Bleeding: None.  Movement: Present. Denies leaking of fluid.  ----------------------------------------------------------------------------------- The following portions of the patient's history were reviewed and updated as appropriate: allergies, current medications, past family history, past medical history, past social history, past surgical history and problem list. Problem list updated.   Objective  Blood pressure 114/68, weight 161 lb (73 kg), last menstrual period 06/26/2017, unknown if currently breastfeeding. Pregravid weight 135 lb (61.2 kg) Total Weight Gain 26 lb (11.8 kg) Urinalysis: Urine Protein    Urine Glucose    Fetal Status: Fetal Heart Rate (bpm): 146 Fundal Height: 37 cm Movement: Present  Presentation: Vertex  General:  Alert, oriented and cooperative. Patient is in no acute distress.  Skin: Skin is warm and dry. No rash noted.   Cardiovascular: Normal heart rate noted  Respiratory: Normal respiratory effort, no problems with respiration noted  Abdomen: Soft, gravid, appropriate for gestational age. Pain/Pressure: Present     Pelvic:  Cervical exam performed Dilation: 3.5 Effacement (%): 50 Station: -1  Extremities: Normal range of motion.  Edema: None  Mental Status: Normal  mood and affect. Normal behavior. Normal judgment and thought content.   Assessment   28 y.o. G3P2002 at [redacted]w[redacted]d by  04/12/2018, by Ultrasound presenting for routine prenatal visit  Plan   THIRD Problems (from 08/12/17 to present)    Problem Noted Resolved   Supervision of other normal pregnancy, antepartum 08/12/2017 by Rexene Agent, CNM No   Overview Addendum 12/07/2017  9:01 AM by Malachy Mood, MD    [facility] Prenatal Labs  Dating [place] changes [facility] 04/12/18 Blood type: A/Positive/-- (07/10 1110)   Genetic Screen NIPS:normal XX Antibody:Negative (07/10 1110)  Anatomic [place] Normal Female Rubella: 2.25 (07/10 1110) Varicella: Immune  GTT Early:na   Third trimester:  RPR: Non Reactive (07/10 1110)   Rhogam na HBsAg: Negative (07/10 1110)   TDaP vaccine  03/19/17   Flu Shot: declines HIV: Non Reactive (07/10 1110)   Baby Food  Formula                              GBS:  Contraception  Unsure, considering vasectomy, used OCP in past Pap:09/2016 normal; repeat PP  CBB   Pelvis tested to 7lbs 5oz  CS/VBAC na   Support Person Gilford Raid interval between pregnancies affecting pregnancy in first trimester, antepartum 08/12/2017 by Rexene Agent, CNM No       Term labor symptoms and general obstetric precautions including but not limited to vaginal bleeding, contractions, leaking of fluid and fetal movement were reviewed in detail with the patient. Please refer to After Visit Summary for other counseling recommendations.   Return in about 1 week (around 04/05/2018) for rob.  Rod Can, CNM 03/29/2018 9:29 AM

## 2018-03-29 NOTE — Patient Instructions (Signed)

## 2018-03-31 ENCOUNTER — Other Ambulatory Visit: Payer: Self-pay

## 2018-03-31 ENCOUNTER — Encounter: Payer: Self-pay | Admitting: *Deleted

## 2018-03-31 ENCOUNTER — Inpatient Hospital Stay
Admission: EM | Admit: 2018-03-31 | Discharge: 2018-04-01 | DRG: 807 | Disposition: A | Discharge status: Home / Self Care | Payer: Commercial Managed Care - PPO | Attending: Obstetrics & Gynecology | Admitting: Obstetrics & Gynecology

## 2018-03-31 DIAGNOSIS — D509 Iron deficiency anemia, unspecified: Secondary | ICD-10-CM | POA: Diagnosis present

## 2018-03-31 DIAGNOSIS — O09891 Supervision of other high risk pregnancies, first trimester: Secondary | ICD-10-CM

## 2018-03-31 DIAGNOSIS — Z3A38 38 weeks gestation of pregnancy: Secondary | ICD-10-CM | POA: Diagnosis not present

## 2018-03-31 DIAGNOSIS — O26893 Other specified pregnancy related conditions, third trimester: Secondary | ICD-10-CM | POA: Diagnosis present

## 2018-03-31 DIAGNOSIS — O99019 Anemia complicating pregnancy, unspecified trimester: Secondary | ICD-10-CM

## 2018-03-31 DIAGNOSIS — Z3480 Encounter for supervision of other normal pregnancy, unspecified trimester: Secondary | ICD-10-CM

## 2018-03-31 DIAGNOSIS — O9902 Anemia complicating childbirth: Secondary | ICD-10-CM | POA: Diagnosis not present

## 2018-03-31 LAB — CBC
HCT: 30.4 % — ABNORMAL LOW (ref 36.0–46.0)
Hemoglobin: 8.9 g/dL — ABNORMAL LOW (ref 12.0–15.0)
MCH: 22.6 pg — ABNORMAL LOW (ref 26.0–34.0)
MCHC: 29.3 g/dL — ABNORMAL LOW (ref 30.0–36.0)
MCV: 77.2 fL — ABNORMAL LOW (ref 80.0–100.0)
Platelets: 220 10*3/uL (ref 150–400)
RBC: 3.94 MIL/uL (ref 3.87–5.11)
RDW: 17.2 % — AB (ref 11.5–15.5)
WBC: 10.1 10*3/uL (ref 4.0–10.5)
nRBC: 0.2 % (ref 0.0–0.2)

## 2018-03-31 LAB — TYPE AND SCREEN
ABO/RH(D): A POS
Antibody Screen: NEGATIVE

## 2018-03-31 MED ORDER — OXYCODONE-ACETAMINOPHEN 5-325 MG PO TABS
1.0000 | ORAL_TABLET | ORAL | Status: DC | PRN
  Administered 2018-03-31 – 2018-04-01 (×3): 1 via ORAL
  Filled 2018-03-31 (×2): qty 1

## 2018-03-31 MED ORDER — ZOLPIDEM TARTRATE 5 MG PO TABS: 5.0000 mg | ORAL_TABLET | Freq: Every evening | ORAL | Status: DC | PRN

## 2018-03-31 MED ORDER — SIMETHICONE 80 MG PO CHEW: 80.0000 mg | CHEWABLE_TABLET | ORAL | Status: DC | PRN

## 2018-03-31 MED ORDER — COCONUT OIL OIL: 1.0000 "application " | TOPICAL_OIL | Status: DC | PRN

## 2018-03-31 MED ORDER — LACTATED RINGERS IV SOLN
INTRAVENOUS | Status: DC
  Administered 2018-03-31: 06:00:00 via INTRAVENOUS

## 2018-03-31 MED ORDER — LIDOCAINE HCL (PF) 1 % IJ SOLN
INTRAMUSCULAR | Status: AC
  Filled 2018-03-31: qty 30

## 2018-03-31 MED ORDER — OXYTOCIN BOLUS FROM INFUSION
500.0000 mL | Freq: Once | INTRAVENOUS | Status: AC
  Administered 2018-03-31: 500 mL via INTRAVENOUS

## 2018-03-31 MED ORDER — ACETAMINOPHEN 325 MG PO TABS: 650.0000 mg | ORAL_TABLET | ORAL | Status: DC | PRN

## 2018-03-31 MED ORDER — WITCH HAZEL-GLYCERIN EX PADS: 1.0000 "application " | MEDICATED_PAD | CUTANEOUS | Status: DC | PRN

## 2018-03-31 MED ORDER — DIPHENHYDRAMINE HCL 25 MG PO CAPS: 25.0000 mg | ORAL_CAPSULE | Freq: Four times a day (QID) | ORAL | Status: DC | PRN

## 2018-03-31 MED ORDER — BENZOCAINE-MENTHOL 20-0.5 % EX AERO
1.0000 "application " | INHALATION_SPRAY | CUTANEOUS | Status: DC | PRN
  Administered 2018-03-31: 1 via TOPICAL
  Filled 2018-03-31: qty 56

## 2018-03-31 MED ORDER — SODIUM CHLORIDE 0.9 % IV SOLN: 250.0000 mL | INTRAVENOUS | Status: DC | PRN

## 2018-03-31 MED ORDER — SODIUM CHLORIDE 0.9% FLUSH: 3.0000 mL | INTRAVENOUS | Status: DC | PRN

## 2018-03-31 MED ORDER — OXYTOCIN 10 UNIT/ML IJ SOLN: 10.0000 [IU] | Freq: Once | INTRAMUSCULAR | Status: DC

## 2018-03-31 MED ORDER — AMMONIA AROMATIC IN INHA
RESPIRATORY_TRACT | Status: AC
  Filled 2018-03-31: qty 10

## 2018-03-31 MED ORDER — OXYTOCIN 10 UNIT/ML IJ SOLN
INTRAMUSCULAR | Status: AC
  Filled 2018-03-31: qty 2

## 2018-03-31 MED ORDER — SENNOSIDES-DOCUSATE SODIUM 8.6-50 MG PO TABS
2.0000 | ORAL_TABLET | ORAL | Status: DC
  Administered 2018-03-31: 2 via ORAL
  Filled 2018-03-31: qty 2

## 2018-03-31 MED ORDER — LACTATED RINGERS IV SOLN: 500.0000 mL | INTRAVENOUS | Status: DC | PRN

## 2018-03-31 MED ORDER — DIBUCAINE 1 % RE OINT: 1.0000 "application " | TOPICAL_OINTMENT | RECTAL | Status: DC | PRN

## 2018-03-31 MED ORDER — ONDANSETRON HCL 4 MG PO TABS: 4.0000 mg | ORAL_TABLET | ORAL | Status: DC | PRN

## 2018-03-31 MED ORDER — LIDOCAINE HCL (PF) 1 % IJ SOLN: 30.0000 mL | INTRAMUSCULAR | Status: DC | PRN

## 2018-03-31 MED ORDER — SODIUM CHLORIDE 0.9% FLUSH
3.0000 mL | Freq: Two times a day (BID) | INTRAVENOUS | Status: DC
  Administered 2018-03-31: 3 mL via INTRAVENOUS

## 2018-03-31 MED ORDER — OXYTOCIN 40 UNITS IN NORMAL SALINE INFUSION - SIMPLE MED
2.5000 [IU]/h | INTRAVENOUS | Status: DC
  Administered 2018-03-31: 2.5 [IU]/h via INTRAVENOUS

## 2018-03-31 MED ORDER — MISOPROSTOL 200 MCG PO TABS
ORAL_TABLET | ORAL | Status: AC
  Filled 2018-03-31: qty 4

## 2018-03-31 MED ORDER — OXYCODONE-ACETAMINOPHEN 5-325 MG PO TABS
2.0000 | ORAL_TABLET | ORAL | Status: DC | PRN
  Filled 2018-03-31: qty 2

## 2018-03-31 MED ORDER — OXYTOCIN 40 UNITS IN NORMAL SALINE INFUSION - SIMPLE MED
INTRAVENOUS | Status: AC
  Administered 2018-03-31: 500 mL via INTRAVENOUS
  Filled 2018-03-31: qty 1000

## 2018-03-31 MED ORDER — ONDANSETRON HCL 4 MG/2ML IJ SOLN: 4.0000 mg | INTRAMUSCULAR | Status: DC | PRN

## 2018-03-31 MED ORDER — IBUPROFEN 600 MG PO TABS
600.0000 mg | ORAL_TABLET | Freq: Four times a day (QID) | ORAL | Status: DC
  Administered 2018-03-31 – 2018-04-01 (×6): 600 mg via ORAL
  Filled 2018-03-31 (×6): qty 1

## 2018-03-31 NOTE — Discharge Summary (Signed)
OB Discharge Summary     Patient Name: Valerie Gardner DOB: 11-22-1990 MRN: [id number]  Date of admission: 03/31/2018 Delivering MD: Hoyt Koch, MD  Date of Delivery: 03/31/2018  Date of discharge: 04/01/2018  Admitting diagnosis: 38 weeks, contractions Intrauterine pregnancy: [redacted]w[redacted]d     Secondary diagnosis: iron deficiency anemia     Discharge diagnosis: Term Pregnancy Delivered, No other diagnosis                         Hospital course:  Onset of Labor With Vaginal Delivery     28 y.o. yo G3P2002 at [redacted]w[redacted]d was admitted in Active Labor on 03/31/2018. Patient had an uncomplicated labor course as follows:  Membrane Rupture Time/Date: 5:40 AM ,03/31/2018   Intrapartum Procedures: Episiotomy: None [1]                                         Lacerations:  None [1]  Patient had a delivery of a Viable female infant. 03/31/2018  Information for the patient's newborn:  Kalsey, Lull [[id number]]  Delivery Method: Vag-Spont    Pateint had an uncomplicated postpartum course.  She is ambulating, tolerating a regular diet, passing flatus, and urinating well. Patient is discharged home in stable condition on 04/01/2018                                                                  Post partum procedures:none  Complications: None  Physical exam on 04/01/2018: Vitals:   03/31/18 1604 03/31/18 2003 03/31/18 2339 04/01/18 0834  BP: (!) 107/59 99/63 113/71 107/63  Pulse: 68 74 69 71  Resp: 20 18 18 20   Temp: 98.6 F (37 C) 98.2 F (36.8 C) 97.9 F (36.6 C) 98 F (36.7 C)  TempSrc: Oral Oral Oral   SpO2: 100% 99% 100% 99%  Weight:      Height:       General: sleepy, but easily aroused, oriented, NAD Lochia: appropriate Uterine Fundus: firm/ U-1/ML/NT Incision: NA DVT Evaluation: No evidence of DVT seen on physical exam. Labs: Lab Results  Component Value Date   WBC 10.7 (H) 04/01/2018   HGB 8.2 (L) 04/01/2018   HCT 28.1 (L) 04/01/2018   MCV 78.9 (L) 04/01/2018    PLT 184 04/01/2018   CMP Latest Ref Rng & Units 03/13/2018  Glucose 70 - 99 mg/dL 85  BUN 6 - 20 mg/dL 7  Creatinine 0.44 - 1.00 mg/dL 0.55  Sodium 135 - 145 mmol/L 138  Potassium 3.5 - 5.1 mmol/L 3.9  Chloride 98 - 111 mmol/L 107  CO2 22 - 32 mmol/L 21(L)  Calcium 8.9 - 10.3 mg/dL 8.0(L)  Total Protein 6.5 - 8.1 g/dL 7.1  Total Bilirubin 0.3 - 1.2 mg/dL 0.8  Alkaline Phos 38 - 126 U/L 140(H)  AST 15 - 41 U/L 22  ALT 0 - 44 U/L 12    Discharge instruction: per After Visit Summary.  Medications:  Allergies as of 04/01/2018   No Known Allergies     Medication List    TAKE these medications   acetaminophen 325 MG tablet Commonly known as:  TYLENOL Take  2 tablets (650 mg total) by mouth every 4 (four) hours as needed for mild pain or moderate pain.   CITRANATAL BLOOM 90-1 MG Tabs Take 1 tablet by mouth daily.   docusate sodium 100 MG capsule Commonly known as:  COLACE Take 1 capsule (100 mg total) by mouth daily as needed.   ferrous sulfate 325 (65 FE) MG tablet Take 1 tablet (325 mg total) by mouth daily with breakfast.       Diet: routine diet  Activity: Advance as tolerated. Pelvic rest for 6 weeks.   Outpatient follow up: Follow-up Information    Gae Dry, MD. Schedule an appointment as soon as possible for a visit in 6 week(s).   Specialty:  Obstetrics and Gynecology Contact information: [address] [place] [address] [phone]             Postpartum contraception: Undecided BTL?, OCPS?  Rhogam Given postpartum: no Rubella vaccine given postpartum: no Varicella vaccine given postpartum: no TDaP given antepartum or postpartum: No, last dose in 2019  Newborn Data: Live born baby girl Birth Weight:  7#1.2oz APGAR: 8, 9  Newborn Delivery   Birth date/time:  03/31/2018 05:41:00 Delivery type:  Vaginal, Spontaneous      Baby Feeding: Bottle  Disposition:home with mother  SIGNED:  Dalia Heading, CNM 04/01/2018  11:13 AM

## 2018-03-31 NOTE — H&P (Signed)
Obstetrics Admission History & Physical   No chief complaint on file.   HPI:  28 y.o. [id number] @ [redacted]w[redacted]d (04/12/2018, by Ultrasound). Admitted on 03/31/2018:   Patient Active Problem List   Diagnosis Date Noted  . Labor and delivery, indication for care 03/13/2018  . Pregnancy 03/07/2018  . Supervision of other normal pregnancy, antepartum 08/12/2017  . Short interval between pregnancies affecting pregnancy in first trimester, antepartum 08/12/2017  . Pregnancy related nausea, antepartum 08/12/2017  . Hyperandrogenemia 06/29/2015     Presents for pain w ctxs since 0300. No VB or ROM.   [facility] at: at [facility]. Pregnancy complicated by none.  ROS: A review of systems was performed and negative, except as stated in the above HPI. PMHx:  Past Medical History:  Diagnosis Date  . [redacted] weeks gestation of pregnancy 04/02/2017  . PCOS (polycystic ovarian syndrome)    PSHx:  Past Surgical History:  Procedure Laterality Date  . NO PAST SURGERIES     Medications:  Medications Prior to Admission  Medication Sig Dispense Refill Last Dose  . Prenatal-DSS-FeCb-FeGl-FA (CITRANATAL BLOOM) 90-1 MG TABS Take 1 tablet by mouth daily. 30 tablet 2 03/30/2018 at Unknown time   Allergies: has No Known Allergies. OBHx:  OB History  Gravida Para Term Preterm AB Living  3 2 2     2   SAB TAB Ectopic Multiple Live Births        0 2    # Outcome Date GA Lbr Len/2nd Weight Sex Delivery Anes PTL Lv  3 Current           2 Term 04/29/17 [redacted]w[redacted]d  3050 g M Vag-Spont EPI  LIV  1 Term 06/29/15 [redacted]w[redacted]d / 02:54 3330 g M Vag-Spont EPI  LIV   QBV:[id number] except as detailed in HPI.Marland Kitchen  No family history of birth defects. Soc Hx: Alcohol: none and Recreational drug use: none  Objective:  There were no vitals filed for this visit. Constitutional: Well nourished, well developed female in no acute distress.  HEENT: normal Skin: Warm and dry.  Cardiovascular:Regular rate and rhythm.   Extremity:  trace to 1+ bilateral pedal edema Respiratory: Clear to auscultation bilateral. Normal respiratory effort Abdomen: moderate Back: no CVAT Neuro: DTRs 2+, Cranial nerves grossly intact Psych: Alert and Oriented x3. No memory deficits. Normal mood and affect.  MS: normal gait, normal bilateral lower extremity ROM/strength/stability.  Pelvic exam: is not limited by body habitus EGBUS: within normal limits Vagina: within normal limits and with no blood in the vault Cervix: 8 cm on initial exam by RN Uterus: Spontaneous uterine activity  Adnexa: not evaluated  EFM:FHR: 140 bpm, variability: moderate,  accelerations:  Present,  decelerations:  Absent Toco: Frequency: Every 2-4 minutes   Perinatal info:  Blood type: A positive Rubella- Immune Varicella -Immune TDaP 2.2019 RPR NR / HIV Neg/ HBsAg Neg   Assessment & Plan:   28 y.o. [id number] @ [redacted]w[redacted]d, Admitted on 03/31/2018: Active labor     Admit for labor, Observe for cervical change and Fetal Wellbeing Reassuring  Barnett Applebaum, MD, [facility] 03/31/2018  6:03 AM

## 2018-04-01 ENCOUNTER — Encounter: Payer: Self-pay | Admitting: Certified Nurse Midwife

## 2018-04-01 DIAGNOSIS — D509 Iron deficiency anemia, unspecified: Secondary | ICD-10-CM | POA: Diagnosis present

## 2018-04-01 DIAGNOSIS — O99019 Anemia complicating pregnancy, unspecified trimester: Secondary | ICD-10-CM

## 2018-04-01 LAB — CBC
HCT: 28.1 % — ABNORMAL LOW (ref 36.0–46.0)
Hemoglobin: 8.2 g/dL — ABNORMAL LOW (ref 12.0–15.0)
MCH: 23 pg — ABNORMAL LOW (ref 26.0–34.0)
MCHC: 29.2 g/dL — ABNORMAL LOW (ref 30.0–36.0)
MCV: 78.9 fL — ABNORMAL LOW (ref 80.0–100.0)
Platelets: 184 10*3/uL (ref 150–400)
RBC: 3.56 MIL/uL — ABNORMAL LOW (ref 3.87–5.11)
RDW: 17.1 % — ABNORMAL HIGH (ref 11.5–15.5)
WBC: 10.7 10*3/uL — ABNORMAL HIGH (ref 4.0–10.5)
nRBC: 0.2 % (ref 0.0–0.2)

## 2018-04-01 MED ORDER — FERROUS SULFATE 325 (65 FE) MG PO TABS
325.0000 mg | ORAL_TABLET | Freq: Two times a day (BID) | ORAL | Status: DC
  Administered 2018-04-01: 325 mg via ORAL
  Filled 2018-04-01: qty 1

## 2018-04-01 MED ORDER — ACETAMINOPHEN 325 MG PO TABS: 650.0000 mg | ORAL_TABLET | ORAL | Status: DC | PRN

## 2018-04-01 MED ORDER — DOCUSATE SODIUM 100 MG PO CAPS: 100.0000 mg | ORAL_CAPSULE | Freq: Every day | ORAL | 2 refills | Status: AC | PRN

## 2018-04-01 MED ORDER — PRENATAL MULTIVITAMIN CH
1.0000 | ORAL_TABLET | Freq: Every day | ORAL | Status: DC
  Administered 2018-04-01: 1 via ORAL
  Filled 2018-04-01: qty 1

## 2018-04-01 MED ORDER — FERROUS SULFATE 325 (65 FE) MG PO TABS: 325.0000 mg | ORAL_TABLET | Freq: Every day | ORAL | 1 refills | Status: DC

## 2018-04-01 NOTE — Plan of Care (Signed)
Vs stable; up ad lib; tolerating regular diet; taking motrin and percocet for pain control; formula feeding baby

## 2018-04-01 NOTE — Progress Notes (Signed)
Discharge order received from doctor. Reviewed discharge instructions and prescriptions with patient and answered all questions. Follow up appointment instructions given. Patient verbalized understanding. ID bands checked. Patient discharged home with infant via wheelchair by nursing/auxillary.    Lemonte Al Garner, RN  

## 2018-04-01 NOTE — Discharge Instructions (Signed)
Please call your doctor or return to the ER if you experience any chest pains, shortness of breath, dizziness, visual changes, fever greater than 101, any heavy bleeding (saturating more than 1 pad per hour), large clots, or foul smelling discharge, any worsening abdominal pain and cramping that is not controlled by pain medication, or any signs of postpartum depression. No tampons, enemas, douches, or sexual intercourse for 6 weeks. Also avoid tub baths, hot tubs, or swimming for 6 weeks.  °

## 2018-04-01 NOTE — Progress Notes (Signed)
Post Partum Day 1 Subjective: no complaints, up ad lib, voiding and tolerating PO. Felt a little lightheaded earlier when OOB. Bleeding slowing. Wants to go home later today  Objective: Blood pressure 107/63, pulse 71, temperature 98 F (36.7 C), resp. rate 20, height 5\' 6"  (1.676 m), weight 73.1 kg, last menstrual period 06/26/2017, SpO2 99 %, unknown if currently breastfeeding.  Physical Exam:  General: sleepy, but easily aroused, oriented, NAD Lochia: appropriate Uterine Fundus: firm/ U-1/ML/NT Incision: NA DVT Evaluation: No evidence of DVT seen on physical exam.  Recent Labs    03/31/18 0533 04/01/18 0605  HGB 8.9* 8.2*  HCT 30.4* 28.1*  WBC 10.1 10.7*  PLT 220 184    Assessment/Plan: Stable PPD #1 Chronic iron deficiency anemia: hemodynamically stable/ an earlier episode of lightheadedness  -discussed vitamin and iron supplementation  -reviewed foods high in iron  -safety instructions A POS/ RI/ VI Bottle/ undecided about contraception?OCPs TDAP UTD Discharge instructions reviewed.  Dalia Heading, CNM   LOS: 1 day   Dalia Heading 04/01/2018, 10:53 AM

## 2018-04-05 ENCOUNTER — Encounter: Payer: Commercial Managed Care - PPO | Admitting: Maternal Newborn

## 2018-05-10 ENCOUNTER — Other Ambulatory Visit (HOSPITAL_COMMUNITY)
Admission: RE | Admit: 2018-05-10 | Discharge: 2018-05-10 | Disposition: A | Discharge status: Home / Self Care | Payer: Commercial Managed Care - PPO | Source: Ambulatory Visit | Attending: Obstetrics & Gynecology | Admitting: Obstetrics & Gynecology

## 2018-05-10 ENCOUNTER — Other Ambulatory Visit: Payer: Self-pay

## 2018-05-10 ENCOUNTER — Ambulatory Visit (INDEPENDENT_AMBULATORY_CARE_PROVIDER_SITE_OTHER): Payer: Commercial Managed Care - PPO | Admitting: Obstetrics & Gynecology

## 2018-05-10 ENCOUNTER — Encounter: Payer: Self-pay | Admitting: Obstetrics & Gynecology

## 2018-05-10 VITALS — BP 110/60 | Ht 66.0 in | Wt 147.0 lb

## 2018-05-10 DIAGNOSIS — Z124 Encounter for screening for malignant neoplasm of cervix: Secondary | ICD-10-CM | POA: Insufficient documentation

## 2018-05-10 DIAGNOSIS — Z30011 Encounter for initial prescription of contraceptive pills: Secondary | ICD-10-CM

## 2018-05-10 DIAGNOSIS — Z1389 Encounter for screening for other disorder: Secondary | ICD-10-CM | POA: Diagnosis not present

## 2018-05-10 MED ORDER — NORETHINDRONE-ETH ESTRADIOL 1-35 MG-MCG PO TABS: 1.0000 | ORAL_TABLET | Freq: Every day | ORAL | 11 refills | Status: DC

## 2018-05-10 NOTE — Progress Notes (Signed)
  OBSTETRICS POSTPARTUM CLINIC PROGRESS NOTE  Subjective:     Valerie Gardner is a 28 y.o. [id number] female who presents for a postpartum visit. She is 6 weeks postpartum following a Term pregnancy and delivery by Vaginal, no problems at delivery.  I have fully reviewed the prenatal and intrapartum course. Anesthesia: none.  Postpartum course has been complicated by uncomplicated.  Baby is feeding by Bottle.  Bleeding: patient has not  resumed menses.  Bowel function is normal. Bladder function is normal.  Patient is not sexually active. Contraception method desired is OCP (estrogen/progesterone).  Postpartum depression screening: negative. [place] 0.  The following portions of the patient's history were reviewed and updated as appropriate: allergies, current medications, past family history, past medical history, past social history, past surgical history and problem list.  Review of Systems Pertinent items are noted in HPI.  Objective:    BP 110/60   Ht 5\' 6"  (1.676 m)   Wt 147 lb (66.7 kg)   BMI 23.73 kg/m   General:  alert and no distress   Breasts:  inspection negative, no nipple discharge or bleeding, no masses or nodularity palpable  Lungs: clear to auscultation bilaterally  Heart:  regular rate and rhythm, S1, S2 normal, no murmur, click, rub or gallop  Abdomen: soft, non-tender; bowel sounds normal; no masses,  no organomegaly.     Vulva:  normal  Vagina: normal vagina, no discharge, exudate, lesion, or erythema  Cervix:  no cervical motion tenderness and no lesions  Corpus: normal size, contour, position, consistency, mobility, non-tender  Adnexa:  normal adnexa and no mass, fullness, tenderness  Rectal Exam: Not performed.          Assessment:  Post Partum Care visit 1. Screening for cervical cancer - Cytology - PAP  2. Postpartum care following vaginal delivery  3. Contraception - OCP pros and cons discussed, and Rx provided - The risks /benefits of  OCPs have been explained to the patient in detail.  Product literature has been given to her.  I have instructed her in the use of OCPs and have given her literature reinforcing this information.  I have explained to the patient that OCPs are not as effective for birth control during the first month of use, and that another form of contraception should be used during this time.  Both first-day start and Sunday start have been explained.  The risks and benefits of each was discussed.  She has been made aware of  the fact that other medications may affect the efficacy of OCPs.  I have answered all of her questions, and I believe that she has an understanding of the effectiveness and use of OCPs.  Plan:  See orders and Patient Instructions Contraceptive counseling for oral contraceptives (estrogen/progesterone) Resume all normal activities Follow up in: 12 months or as needed.   Barnett Applebaum, MD, [facility], [facility] 05/10/2018  8:05 AM

## 2018-05-10 NOTE — Patient Instructions (Signed)
Ethinyl Estradiol; Norethindrone tablets What is this medicine? ETHINYL ESTRADIOL; NORETHINDRONE (ETH in il es tra DYE ole; nor eth IN drone) is an oral contraceptive. The products combine two types of female hormones, an estrogen and a progestin. They are used to prevent ovulation and pregnancy. This medicine may be used for other purposes; ask your health care provider or pharmacist if you have questions. COMMON BRAND NAME(S): Bary Richard, Brevicon, BRIELLYN, Cyclafem 1/35, Cyclafem 7/7/7, DASETTA, [place], [place], [place], Necon 0.5/35, Necon 1/35, Necon 10/11, Necon 7/7/7, Norinyl 1/35, Nortrel 0.5/35, Nortrel 1/35, Nortrel 7/7/7, Ortho-Novum 1/35, Ortho-Novum 10/11, Ortho-Novum 7/7/7, Ovcon 63, Ovcon 1, PHILITH, Pirmella, Tri-Norinyl, Vyfemla, WERA, [place] What should I tell my health care provider before I take this medicine? They need to know if you have or ever had any of these conditions: -abnormal vaginal bleeding -blood vessel disease or blood clots -breast, cervical, endometrial, ovarian, liver, or uterine cancer -diabetes -gallbladder disease -heart disease or recent heart attack -high blood pressure -high cholesterol -kidney disease -liver disease -migraine headaches -stroke -systemic lupus erythematosus (SLE) -tobacco smoker -an unusual or allergic reaction to estrogens, progestins, other medicines, foods, dyes, or preservatives -pregnant or trying to get pregnant -breast-feeding How should I use this medicine? Take this medicine by mouth. To reduce nausea, this medicine may be taken with food. Follow the directions on the prescription label. Take this medicine at the same time each day and in the order directed on the package. Do not take your medicine more often than directed. A patient package insert for the product will be given with each prescription and refill. Read this sheet carefully each time. The sheet may change frequently. Contact your  pediatrician regarding the use of this medicine in children. Special care may be needed. This medicine has been used in female children who have started having menstrual periods. Overdosage: If you think you have taken too much of this medicine contact a poison control center or emergency room at once. NOTE: This medicine is only for you. Do not share this medicine with others. What if I miss a dose? If you miss a dose, refer to the patient information sheet you received with your medicine for direction. If you miss more than one pill, this medicine may not be as effective and you may need to use another form of birth control. What may interact with this medicine? Do not take this medicine with the following medication: -dasabuvir; ombitasvir; paritaprevir; ritonavir -ombitasvir; paritaprevir; ritonavir This medicine may also interact with the following medications: -acetaminophen -antibiotics or medicines for infections, especially rifampin, rifabutin, rifapentine, and griseofulvin, and possibly penicillins or tetracyclines -aprepitant -ascorbic acid (vitamin C) -atorvastatin -barbiturate medicines, such as phenobarbital -bosentan -carbamazepine -caffeine -clofibrate -cyclosporine -dantrolene -doxercalciferol -felbamate -grapefruit juice -hydrocortisone -medicines for anxiety or sleeping problems, such as diazepam or temazepam -medicines for diabetes, including pioglitazone -mineral oil -modafinil -mycophenolate -nefazodone -oxcarbazepine -phenytoin -prednisolone -ritonavir or other medicines for HIV infection or AIDS -rosuvastatin -selegiline -soy isoflavones supplements -St. John's wort -tamoxifen or raloxifene -theophylline -thyroid hormones -topiramate -warfarin This list may not describe all possible interactions. Give your health care provider a list of all the medicines, herbs, non-prescription drugs, or dietary supplements you use. Also tell them if you smoke,  drink alcohol, or use illegal drugs. Some items may interact with your medicine. What should I watch for while using this medicine? Visit your doctor or health care professional for regular checks on your progress. You will need a regular breast and pelvic exam and  Pap smear while on this medicine. Use an additional method of contraception during the first cycle that you take these tablets. If you have any reason to think you are pregnant, stop taking this medicine right away and contact your doctor or health care professional. If you are taking this medicine for hormone related problems, it may take several cycles of use to see improvement in your condition. Smoking increases the risk of getting a blood clot or having a stroke while you are taking birth control pills, especially if you are more than 28 years old. You are strongly advised not to smoke. This medicine can make your body retain fluid, making your fingers, hands, or ankles swell. Your blood pressure can go up. Contact your doctor or health care professional if you feel you are retaining fluid. This medicine can make you more sensitive to the sun. Keep out of the sun. If you cannot avoid being in the sun, wear protective clothing and use sunscreen. Do not use sun lamps or tanning beds/booths. If you wear contact lenses and notice visual changes, or if the lenses begin to feel uncomfortable, consult your eye care specialist. In some women, tenderness, swelling, or minor bleeding of the gums may occur. Notify your dentist if this happens. Brushing and flossing your teeth regularly may help limit this. See your dentist regularly and inform your dentist of the medicines you are taking. If you are going to have elective surgery, you may need to stop taking this medicine before the surgery. Consult your health care professional for advice. This medicine does not protect you against HIV infection (AIDS) or any other sexually transmitted  diseases. What side effects may I notice from receiving this medicine? Side effects that you should report to your doctor or health care professional as soon as possible: -allergic reactions like skin rash, itching or hives, swelling of the face, lips, or tongue -breast tissue changes or discharge -changes in vaginal bleeding during your period or between your periods -chest pain -coughing up blood -dizziness or fainting spells -headaches or migraines -leg, arm or groin pain -problems with balance, talking, walking -severe or sudden headaches -severe stomach pain -sudden shortness of breath -symptoms of vaginal infection like itching, irritation or unusual discharge -tenderness in the upper abdomen -vomiting -weakness or numbness in the arms or legs, especially on one side of the body -yellowing of the eyes or skin Side effects that usually do not require medical attention (report to your doctor or health care professional if they continue or are bothersome): -breakthrough bleeding and spotting that continues beyond the 3 initial cycles of pills -breast tenderness -mood changes, anxiety, depression, frustration, anger, or emotional outbursts -increased sensitivity to sun or ultraviolet light -nausea -skin rash, acne, or brown spots on the skin -slight weight gain This list may not describe all possible side effects. Call your doctor for medical advice about side effects. You may report side effects to FDA at 1-800-FDA-1088. Where should I keep my medicine? Keep out of the reach of children. Store at room temperature between 15 and 30 degrees C (59 and 86 degrees F). Throw away any unused medicine after the expiration date. NOTE: This sheet is a summary. It may not cover all possible information. If you have questions about this medicine, talk to your doctor, pharmacist, or health care provider.  2019 Elsevier/Gold Standard (2015-10-01 08:06:39)

## 2018-05-12 LAB — CYTOLOGY - PAP: Diagnosis: NEGATIVE

## 2019-02-12 ENCOUNTER — Other Ambulatory Visit: Payer: Self-pay | Admitting: Obstetrics & Gynecology

## 2019-02-12 DIAGNOSIS — Z30011 Encounter for initial prescription of contraceptive pills: Secondary | ICD-10-CM

## 2019-02-14 NOTE — Telephone Encounter (Signed)
Called and left voice mail for patient to call back to be schedule °

## 2019-02-14 NOTE — Telephone Encounter (Signed)
Sch Annual in April Refill done

## 2019-04-21 ENCOUNTER — Other Ambulatory Visit: Payer: Self-pay

## 2019-04-21 DIAGNOSIS — Z30011 Encounter for initial prescription of contraceptive pills: Secondary | ICD-10-CM

## 2019-04-21 MED ORDER — ALYACEN 1/35 1-35 MG-MCG PO TABS: 1.0000 | ORAL_TABLET | Freq: Every day | ORAL | 0 refills | Status: DC

## 2019-04-23 IMAGING — US US RENAL
1 series · 14 of 25 positions shown · non-contrast
Comparison: None.

CLINICAL DATA: 27-year-old pregnant female at 37 weeks gestation,
presenting with recent hematuria and left-sided pain.

EXAM:
RENAL / URINARY TRACT ULTRASOUND COMPLETE

[Series 1: us renal · 14 of 33 slices shown]
[im 1/33]
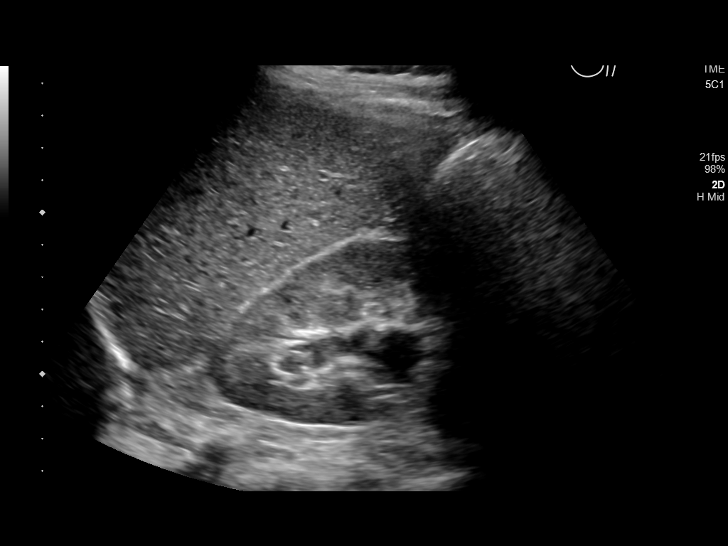
[im 3/33]
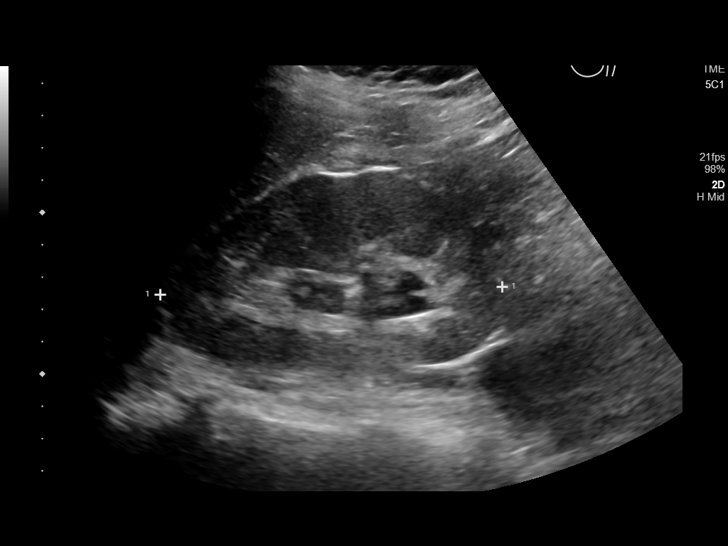
[im 6/33]
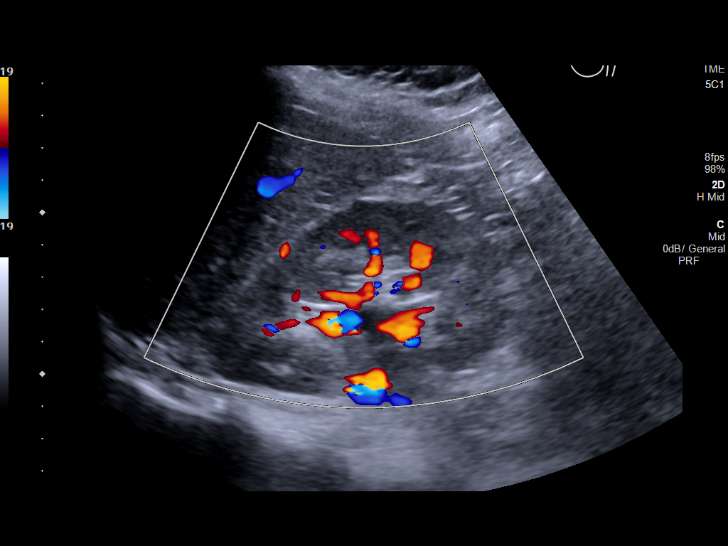
[im 9/33]
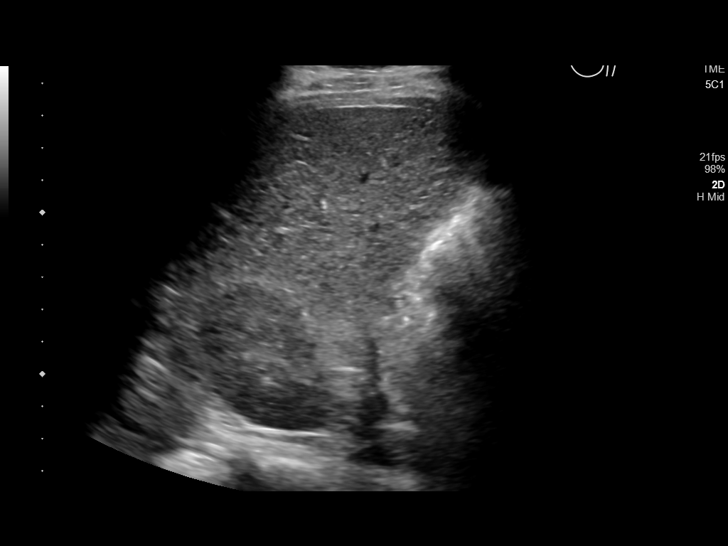
[im 11/33]
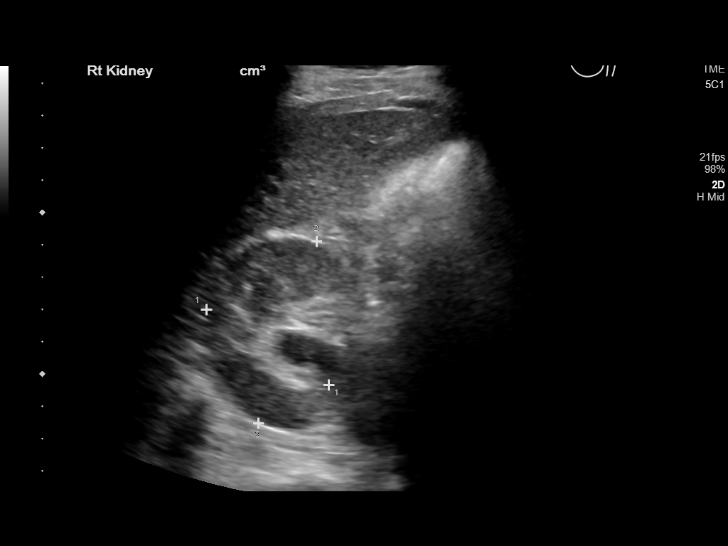
[im 13/33]
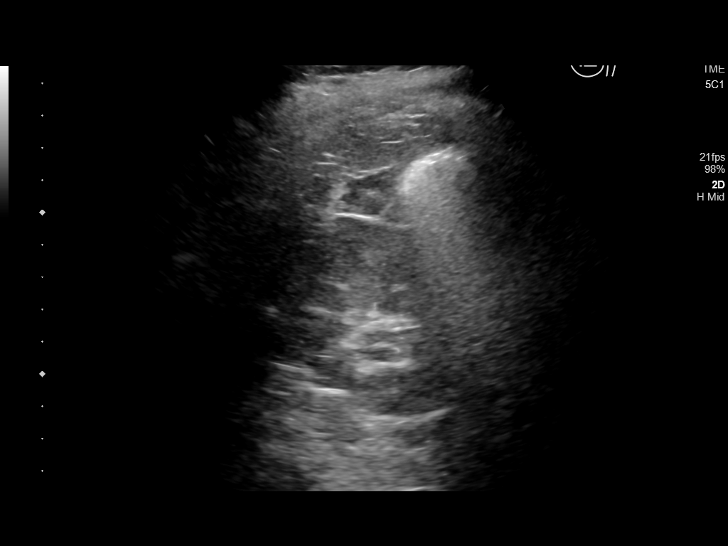
[im 15/33]
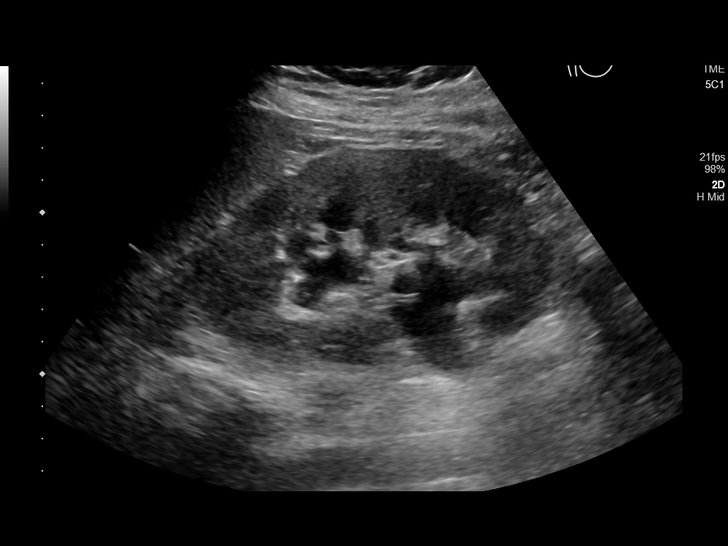
[im 18/33]
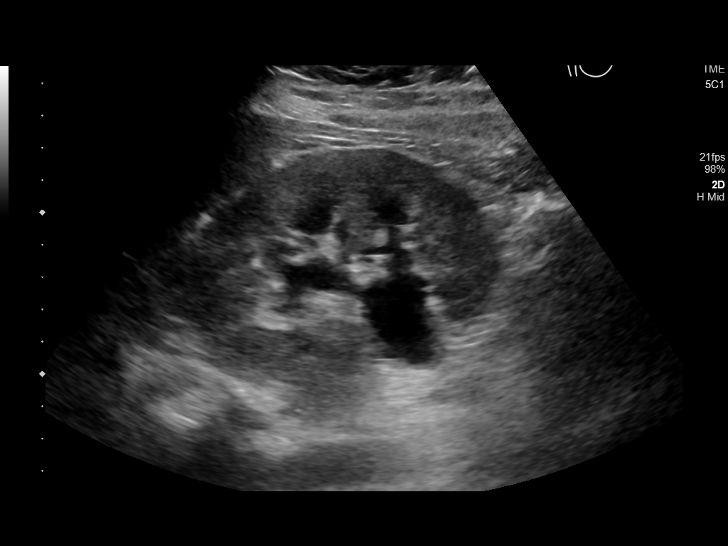
[im 21/33]
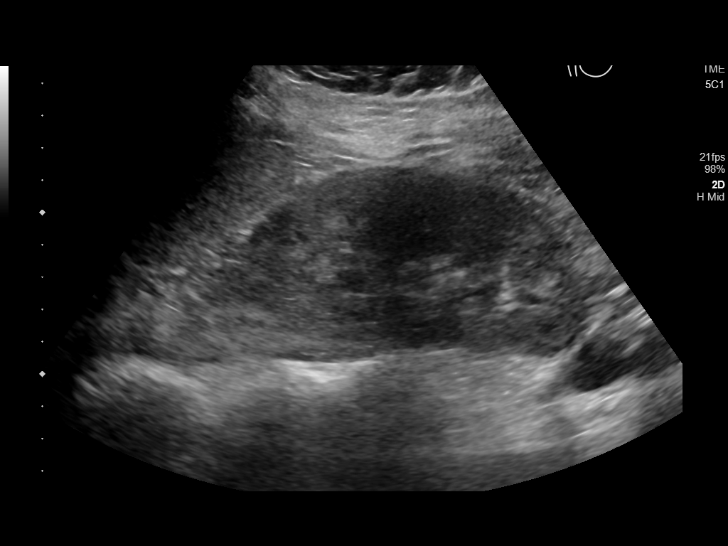
[im 22/33]
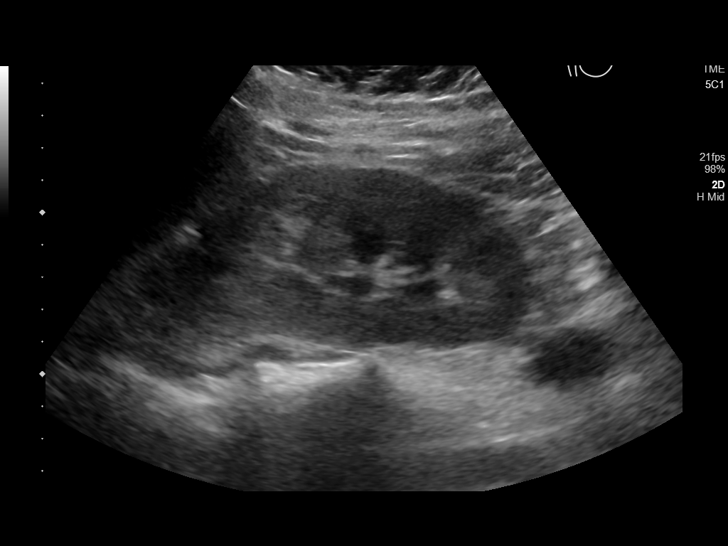
[im 25/33]
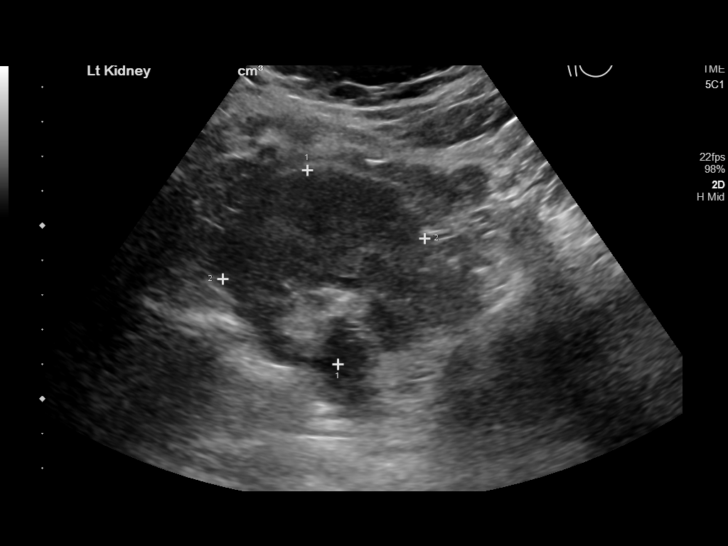
[im 27/33]
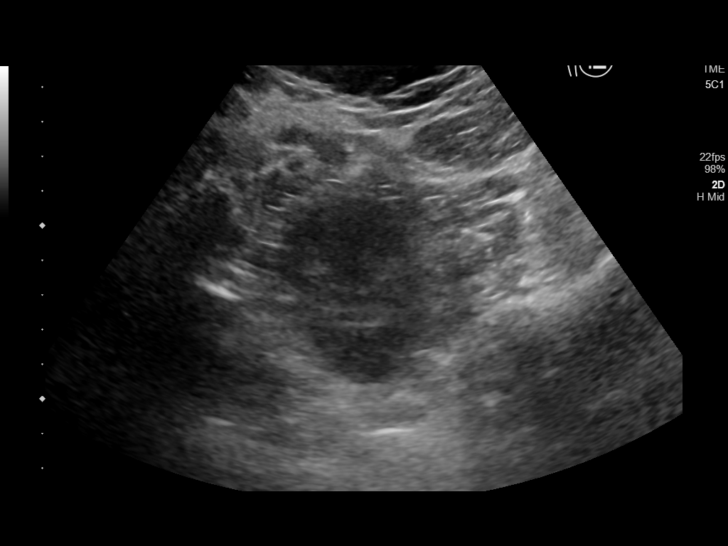
[im 30/33]
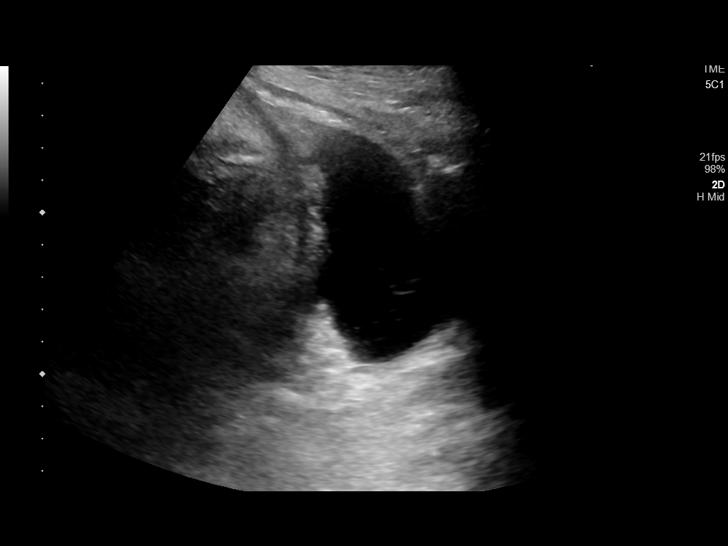
[im 33/33]
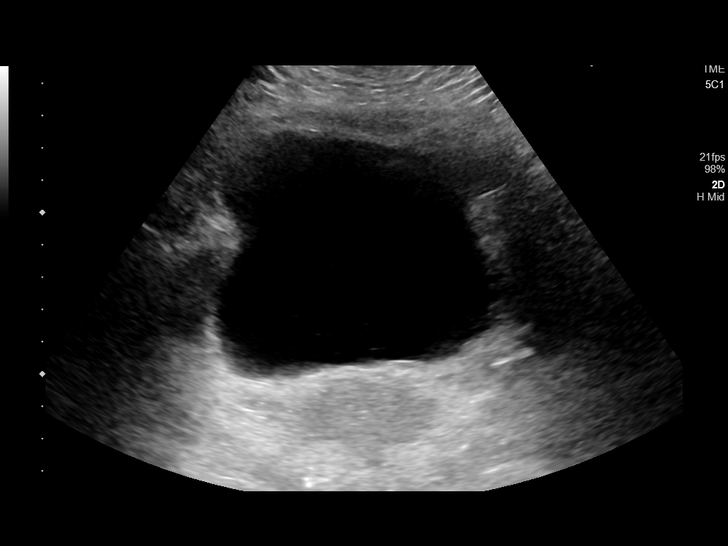

[14 of 25 positions shown; findings below may reference images not displayed]

FINDINGS: Right Kidney:

Renal measurements: 10.6 x 4.5 x 5.9 cm = volume: 146 mL. Normal
renal parenchymal echogenicity and thickness. Mild right
pelvicaliectasis. No renal mass.

Left Kidney:

Renal measurements: 11.2 x 5.7 x 5.9 cm = volume: 197 mL. Normal
renal parenchymal echogenicity and thickness. Moderate left
hydronephrosis. No renal mass.

Bladder:

Appears normal for degree of bladder distention.
IMPRESSION: 1. Moderate left hydronephrosis, suspicious for left urinary tract
obstruction at the level of the left ureter.
2. Mild right pelvocaliectasis, within normal limits at this
gestational age.
3. Normal bladder.

## 2019-05-17 ENCOUNTER — Ambulatory Visit (INDEPENDENT_AMBULATORY_CARE_PROVIDER_SITE_OTHER): Payer: Commercial Managed Care - PPO | Admitting: Advanced Practice Midwife

## 2019-05-17 ENCOUNTER — Encounter: Payer: Self-pay | Admitting: Advanced Practice Midwife

## 2019-05-17 ENCOUNTER — Other Ambulatory Visit: Payer: Self-pay

## 2019-05-17 VITALS — BP 94/64 | Ht 66.0 in | Wt 132.0 lb

## 2019-05-17 DIAGNOSIS — Z0000 Encounter for general adult medical examination without abnormal findings: Secondary | ICD-10-CM | POA: Diagnosis not present

## 2019-05-17 DIAGNOSIS — R5383 Other fatigue: Secondary | ICD-10-CM | POA: Diagnosis not present

## 2019-05-17 DIAGNOSIS — Z30011 Encounter for initial prescription of contraceptive pills: Secondary | ICD-10-CM

## 2019-05-17 MED ORDER — ALYACEN 1/35 1-35 MG-MCG PO TABS: 1.0000 | ORAL_TABLET | Freq: Every day | ORAL | 4 refills | Status: DC

## 2019-05-17 NOTE — Progress Notes (Signed)
Gynecology Annual Exam   PCP: Patient, No Pcp Per  Chief Complaint:  Chief Complaint  Patient presents with  . Gynecologic Exam    History of Present Illness: Patient is a 29 y.o. G3P3003 presents for annual exam. The patient has complaint today of pain with sex occasionally. It feels like her husband is "bumping into something". She had bleeding one time after intercourse. She denies dryness or the feeling of stinging or a laceration. She has complaint of fatigue that started about 2 months ago. She started taking a women's multivitamin about 1 month ago and has had some improvement. She has also been having headaches that usually correspond with her periods in the week leading up to them. She also mentions low back pain and hair thinning. We discussed these issues and some possible treatment options.   LMP: Patient's last menstrual period was 05/10/2019. Average Interval: regular, 28 days Duration of flow: 4-5 days Heavy Menses: sometimes heavy Clots: no Intermenstrual Bleeding: no Postcoital Bleeding: once Dysmenorrhea: no  The patient is sexually active. She currently uses OCP (estrogen/progesterone) for contraception.  The patient does perform self breast exams.  There is no notable family history of breast or ovarian cancer in her family.  The patient wears seatbelts: yes.   The patient has regular exercise: she is active with her 3 young children. She admits healthy diet and adequate hydration. She doesn't think she is getting enough sleep.    The patient denies current symptoms of depression.    Review of Systems: Review of Systems  Constitutional: Positive for malaise/fatigue.  HENT: Negative.   Eyes: Negative.   Respiratory: Negative.   Cardiovascular: Negative.   Gastrointestinal: Negative.   Genitourinary: Negative.   Musculoskeletal: Negative.   Skin: Negative.   Neurological: Positive for headaches.  Endo/Heme/Allergies: Negative.   Psychiatric/Behavioral:  Negative.     Past Medical History:  Patient Active Problem List   Diagnosis Date Noted  . Postpartum care following vaginal delivery 04/01/2018  . 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 NO PAST SURGERIES      Gynecologic History:  Patient's last menstrual period was 05/10/2019. Contraception: OCP (estrogen/progesterone) Last Pap: 1 year ago Results were: no abnormalities   Obstetric History: [id number]  Family History:  Family History  Problem Relation Age of Onset  . Diabetes Mellitus I Sister   . Melanoma Maternal Grandmother   . Diabetes Mellitus II Paternal Grandmother   . COPD Paternal Grandmother   . Hypertension Mother   . Hypertension Father     Social History:  Social History   Socioeconomic History  . Marital status: Married    Spouse name: [name]  . Number of children: 2  . Years of education: 76  . Highest education level: Not on file  Occupational History  . Occupation: STAY AT HOME MOM  Tobacco Use  . Smoking status: Former Research scientist (life sciences)  . Smokeless tobacco: Never Used  Substance and Sexual Activity  . Alcohol use: No  . Drug use: No  . Sexual activity: Yes    Birth control/protection: None, Pill  Other Topics Concern  . Not on file  Social History Narrative  . Not on file   Social Determinants of Health   Financial Resource Strain:   . Difficulty of Paying Living Expenses:   Food Insecurity:   . Worried About Charity fundraiser in the Last Year:   . [employer]  of Food in the Last Year:   Transportation Needs:   . Film/video editor (Medical):   Marland Kitchen Lack of Transportation (Non-Medical):   Physical Activity:   . Days of Exercise per Week:   . Minutes of Exercise per Session:   Stress:   . Feeling of Stress :   Social Connections:   . Frequency of Communication with Friends and Family:   . Frequency of Social Gatherings with Friends and Family:   . Attends Religious  Services:   . Active Member of Clubs or Organizations:   . Attends Archivist Meetings:   Marland Kitchen Marital Status:   Intimate Partner Violence:   . Fear of Current or Ex-Partner:   . Emotionally Abused:   Marland Kitchen Physically Abused:   . Sexually Abused:     Allergies:  No Known Allergies  Medications: Prior to Admission medications   Medication Sig Start Date End Date Taking? Authorizing Provider  COLLAGEN PO Take by mouth daily.   Yes [provider]  Multiple Vitamins-Minerals (WOMENS ONE DAILY PO) Take by mouth.   Yes [provider]  norethindrone-ethinyl estradiol 1/35 (ALAYCEN 1/35) tablet Take 1 tablet by mouth daily. 04/21/19  Yes Gae Dry, MD    Physical Exam Vitals: Blood pressure 94/64, height 5\' 6"  (1.676 m), weight 132 lb (59.9 kg), last menstrual period 05/10/2019, unknown if currently breastfeeding.  General: NAD HEENT: normocephalic, anicteric Thyroid: no enlargement, no palpable nodules Pulmonary: No increased work of breathing, CTAB Cardiovascular: RRR, distal pulses 2+ Breast: Breast symmetrical, no tenderness, no palpable nodules or masses, no skin or nipple retraction present, no nipple discharge.  No axillary or supraclavicular lymphadenopathy. Abdomen: NABS, soft, non-tender, non-distended.  Umbilicus without lesions.  No hepatomegaly, splenomegaly or masses palpable. No evidence of hernia  Genitourinary: deferred for no concerns/PAP interval Extremities: no edema, erythema, or tenderness Neurologic: Grossly intact Psychiatric: mood appropriate, affect full    Assessment: 29 y.o. G3P3003 routine annual exam  Plan: Problem List Items Addressed This Visit    None    Visit Diagnoses    Well woman exam without gynecological exam    -  Primary   Relevant Orders   CBC with Differential/Platelet   Comprehensive metabolic panel   Fatigue, unspecified type       Relevant Orders   CBC with Differential/Platelet   Comprehensive  metabolic panel   Encounter for initial prescription of contraceptive pills       Relevant Medications   norethindrone-ethinyl estradiol 1/35 (ALAYCEN 1/35) tablet      1) STI screening  was  offered and declined  2)  ASCCP guidelines and rationale discussed.  Patient opts for every 3 years screening interval  3) Contraception - the patient is currently using  OCP (estrogen/progesterone).  She is happy with her current form of contraception and plans to continue  4) Routine healthcare maintenance including cholesterol, diabetes screening discussed: CBC and CMP ordered today  5) Fatigue: increase sleep time/nap as needed, take Fe supplement as needed following labs  6) Headaches: sleep, hydration, OTC magnesium  7) Back pain: back exercises, heat/ice, soak in epsom salts  8) Hair thinning: take gelatin or bone broth supplement  9) Return in about 1 year (around 05/16/2020) for annual established gyn or for worsening symptoms   Rod Can, [facility] 05/17/2019, 4:29 PM

## 2019-05-17 NOTE — Patient Instructions (Addendum)

## 2019-05-18 LAB — COMPREHENSIVE METABOLIC PANEL
ALT: 18 IU/L (ref 0–32)
AST: 18 IU/L (ref 0–40)
Albumin/Globulin Ratio: 1.8 (ref 1.2–2.2)
Albumin: 4.6 g/dL (ref 3.9–5.0)
Alkaline Phosphatase: 51 IU/L (ref 39–117)
BUN/Creatinine Ratio: 12 (ref 9–23)
BUN: 9 mg/dL (ref 6–20)
Bilirubin Total: 0.2 mg/dL (ref 0.0–1.2)
CO2: 21 mmol/L (ref 20–29)
Calcium: 8.7 mg/dL (ref 8.7–10.2)
Chloride: 105 mmol/L (ref 96–106)
Creatinine, Ser: 0.76 mg/dL (ref 0.57–1.00)
GFR calc Af Amer: 123 mL/min/{1.73_m2} (ref >59)
GFR calc non Af Amer: 107 mL/min/{1.73_m2} (ref >59)
Globulin, Total: 2.5 g/dL (ref 1.5–4.5)
Glucose: 89 mg/dL (ref 65–99)
Potassium: 4.6 mmol/L (ref 3.5–5.2)
Sodium: 141 mmol/L (ref 134–144)
Total Protein: 7.1 g/dL (ref 6.0–8.5)

## 2019-05-18 LAB — CBC WITH DIFFERENTIAL/PLATELET
Basophils Absolute: 0.1 10*3/uL (ref 0.0–0.2)
Basos: 1 %
EOS (ABSOLUTE): 0.1 10*3/uL (ref 0.0–0.4)
Eos: 2 %
Hematocrit: 39.7 % (ref 34.0–46.6)
Hemoglobin: 12.7 g/dL (ref 11.1–15.9)
Immature Grans (Abs): 0 10*3/uL (ref 0.0–0.1)
Immature Granulocytes: 0 %
Lymphocytes Absolute: 2.4 10*3/uL (ref 0.7–3.1)
Lymphs: 47 %
MCH: 28.3 pg (ref 26.6–33.0)
MCHC: 32 g/dL (ref 31.5–35.7)
MCV: 88 fL (ref 79–97)
Monocytes Absolute: 0.5 10*3/uL (ref 0.1–0.9)
Monocytes: 9 %
Neutrophils Absolute: 2.1 10*3/uL (ref 1.4–7.0)
Neutrophils: 41 %
Platelets: 267 10*3/uL (ref 150–450)
RBC: 4.49 x10E6/uL (ref 3.77–5.28)
RDW: 13.5 % (ref 11.7–15.4)
WBC: 5.1 10*3/uL (ref 3.4–10.8)

## 2019-09-09 ENCOUNTER — Other Ambulatory Visit (HOSPITAL_COMMUNITY)
Admission: RE | Admit: 2019-09-09 | Discharge: 2019-09-09 | Disposition: A | Discharge status: Home / Self Care | Payer: Commercial Managed Care - PPO | Source: Ambulatory Visit | Attending: Obstetrics & Gynecology | Admitting: Obstetrics & Gynecology

## 2019-09-09 ENCOUNTER — Encounter: Payer: Self-pay | Admitting: Obstetrics & Gynecology

## 2019-09-09 ENCOUNTER — Ambulatory Visit (INDEPENDENT_AMBULATORY_CARE_PROVIDER_SITE_OTHER): Payer: Commercial Managed Care - PPO | Admitting: Obstetrics & Gynecology

## 2019-09-09 ENCOUNTER — Other Ambulatory Visit: Payer: Self-pay

## 2019-09-09 VITALS — BP 100/60 | Ht 66.0 in | Wt 131.0 lb

## 2019-09-09 DIAGNOSIS — R102 Pelvic and perineal pain: Secondary | ICD-10-CM | POA: Diagnosis not present

## 2019-09-09 DIAGNOSIS — Z124 Encounter for screening for malignant neoplasm of cervix: Secondary | ICD-10-CM | POA: Insufficient documentation

## 2019-09-09 DIAGNOSIS — N9419 Other specified dyspareunia: Secondary | ICD-10-CM | POA: Diagnosis not present

## 2019-09-09 MED ORDER — DROSPIRENONE-ETHINYL ESTRADIOL 3-0.02 MG PO TABS: 1.0000 | ORAL_TABLET | Freq: Every day | ORAL | 11 refills | Status: DC

## 2019-09-09 NOTE — Progress Notes (Signed)
Gynecology Pelvic Pain Evaluation   Chief Complaint  Patient presents with  . Pelvic Pain    History of Present Illness:   Patient is a 29 y.o. [id number] who LMP was Patient's last menstrual period was 08/30/2019., presents today for a problem visit.  She complains of pain.   Her pain is localized to the back for thelast few months w radiation to the LLQ or side.  Also has dyspareunia w occas sharp shooting pain.  Cycles are reg on OCPs, althougb she reports concerns over hair loss and premenstrual headaches regularly. No modifers, as Tylenol does not help and heating pads help only while using them.  No other associated sx's.  PMHx: She  has a past medical history of [redacted] weeks gestation of pregnancy (04/02/2017) and PCOS (polycystic ovarian syndrome). Also,  has a past surgical history that includes No past surgeries., family history includes COPD in her paternal grandmother; Diabetes Mellitus I in her sister; Diabetes Mellitus II in her paternal grandmother; Hypertension in her father and mother; Melanoma in her maternal grandmother.,  reports that she has quit smoking. She has never used smokeless tobacco. She reports that she does not drink alcohol and does not use drugs.  She has a current medication list which includes the following prescription(s): collagen and multiple vitamins-minerals. Also, has No Known Allergies.  Review of Systems  Constitutional: Positive for malaise/fatigue. Negative for chills and fever.  HENT: Negative for congestion, sinus pain and sore throat.   Eyes: Negative for blurred vision and pain.  Respiratory: Negative for cough and wheezing.   Cardiovascular: Negative for chest pain and leg swelling.  Gastrointestinal: Positive for abdominal pain. Negative for constipation, diarrhea, heartburn, nausea and vomiting.  Genitourinary: Negative for dysuria, frequency, hematuria and urgency.  Musculoskeletal: Negative for back pain, joint pain, myalgias and neck pain.    Skin: Negative for itching and rash.  Neurological: Positive for headaches. Negative for dizziness, tremors and weakness.  Endo/Heme/Allergies: Does not bruise/bleed easily.  Psychiatric/Behavioral: Negative for depression. The patient is not nervous/anxious and does not have insomnia.     Objective: BP 100/60   Ht 5\' 6"  (1.676 m)   Wt 131 lb (59.4 kg)   LMP 08/30/2019   BMI 21.14 kg/m  Physical Exam Constitutional:      General: She is not in acute distress.    Appearance: She is well-developed.  Genitourinary:     Pelvic exam was performed with patient supine.     Urethra, bladder and vagina normal.     No vaginal erythema or bleeding.     No cervical motion tenderness, discharge, polyp or nabothian cyst.     Uterus is tender and mobile.     Uterus is not enlarged.     No uterine mass detected.    Uterus is anteverted.     No right or left adnexal mass present.     Right adnexa not tender.     Left adnexa not tender.     Genitourinary Comments: Mildly T Uterus  HENT:     Head: Normocephalic and atraumatic.     Nose: Nose normal.  Abdominal:     General: There is no distension.     Palpations: Abdomen is soft.     Tenderness: There is no abdominal tenderness.  Musculoskeletal:        General: Normal range of motion.  Neurological:     Mental Status: She is alert and oriented to person, place, and time.  Cranial Nerves: No cranial nerve deficit.  Skin:    General: Skin is warm and dry.  Psychiatric:        Attention and Perception: Attention normal.        Mood and Affect: Mood and affect normal.        Speech: Speech normal.        Behavior: Behavior normal.        Thought Content: Thought content normal.        Judgment: Judgment normal.    Female chaperone present for pelvic portion of the physical exam  Assessment: 29 y.o. [id number] with Pain.  Problem List Items Addressed This Visit    Pelvic pain    -  Primary   Dyspareunia due to medical condition  in female        [place] to assess anatomy May be more MS or neuro etiology Change OCP (mainly for premenstrual headache and hair loss concerns) PAP today, due   Barnett Applebaum, MD, [facility], [facility] 09/09/2019  11:13 AM

## 2019-09-12 LAB — CYTOLOGY - PAP: Diagnosis: NEGATIVE

## 2019-09-30 ENCOUNTER — Encounter: Payer: Self-pay | Admitting: Obstetrics & Gynecology

## 2019-09-30 ENCOUNTER — Ambulatory Visit (INDEPENDENT_AMBULATORY_CARE_PROVIDER_SITE_OTHER): Payer: Commercial Managed Care - PPO | Admitting: Obstetrics & Gynecology

## 2019-09-30 ENCOUNTER — Other Ambulatory Visit: Payer: Self-pay | Admitting: Obstetrics & Gynecology

## 2019-09-30 ENCOUNTER — Ambulatory Visit (INDEPENDENT_AMBULATORY_CARE_PROVIDER_SITE_OTHER): Payer: Commercial Managed Care - PPO

## 2019-09-30 ENCOUNTER — Other Ambulatory Visit: Payer: Self-pay

## 2019-09-30 VITALS — BP 120/80 | Ht 66.0 in | Wt 131.0 lb

## 2019-09-30 DIAGNOSIS — R102 Pelvic and perineal pain unspecified side: Secondary | ICD-10-CM

## 2019-09-30 DIAGNOSIS — N9419 Other specified dyspareunia: Secondary | ICD-10-CM

## 2019-09-30 DIAGNOSIS — M545 Low back pain, unspecified: Secondary | ICD-10-CM

## 2019-09-30 NOTE — Progress Notes (Signed)
  HPI: Pt was having low back pain L>R and also dyspareunia at times.    Ultrasound demonstrates no masses seen, no cysts  PMHx: She  has a past medical history of [redacted] weeks gestation of pregnancy (04/02/2017) and PCOS (polycystic ovarian syndrome). Also,  has a past surgical history that includes No past surgeries., family history includes COPD in her paternal grandmother; Diabetes Mellitus I in her sister; Diabetes Mellitus II in her paternal grandmother; Hypertension in her father and mother; Melanoma in her maternal grandmother.,  reports that she has quit smoking. She has never used smokeless tobacco. She reports that she does not drink alcohol and does not use drugs.  She has a current medication list which includes the following prescription(s): collagen, drospirenone-ethinyl estradiol, and multiple vitamins-minerals. Also, has No Known Allergies.  Review of Systems  All other systems reviewed and are negative.   Objective: BP 120/80   Ht 5\' 6"  (1.676 m)   Wt 131 lb (59.4 kg)   LMP 09/27/2019   BMI 21.14 kg/m   Physical examination Constitutional NAD, Conversant  Skin No rashes, lesions or ulceration.   Extremities: Moves all appropriately.  Normal ROM for age. No lymphadenopathy.  Neuro: Grossly intact  Psych: Oriented to PPT.  Normal mood. Normal affect.   US PELVIS TRANSVAGINAL NON-OB (TV ONLY)  Result Date: 09/30/2019 Patient Name: Valerie Gardner DOB: 09-26-1990 MRN: [id number] ULTRASOUND REPORT Location: [facility] Date of Service: 09/30/2019 Indications:Pelvic Pain Findings: The uterus is anteverted and measures 7.9 x 5.6 x 3.7 cm. Echo texture is homogenous without evidence of focal masses. The Endometrium measures 1.6 mm. Right Ovary measures 4.0 x 2.7 x 2.4 cm. It is normal in appearance. Left Ovary measures 3.5 x 2.0 x 1.5 cm. It is normal in appearance. Survey of the adnexa demonstrates no adnexal masses. There is no free fluid in the cul de sac. Impression: 1.  Normal pelvic ultrasound. Recommendations: 1.Clinical correlation with the patient's History and Physical Exam. Gweneth Dimitri, RT Review of ULTRASOUND.    I have personally reviewed images and report of recent ultrasound done at [facility].    Plan of management to be discussed with patient. Barnett Applebaum, MD, [facility], [facility] 09/30/2019  11:08 AM   Assessment:  Dyspareunia due to medical condition in female  Left-sided low back pain without sciatica, unspecified chronicity  Change in OCP to start this Sunday. PT discussed, offered    Can schedule at a future time as needed or desired Pt to see Chiropractor as next step for back pain Monitor dyspareunia w above changes   A total of 20 minutes were spent face-to-face with the patient as well as preparation, review, communication, and documentation during this encounter.   Barnett Applebaum, MD, [facility], [facility] 09/30/2019  11:14 AM

## 2020-02-12 DIAGNOSIS — R197 Diarrhea, unspecified: Secondary | ICD-10-CM | POA: Insufficient documentation

## 2020-02-12 DIAGNOSIS — R0602 Shortness of breath: Secondary | ICD-10-CM | POA: Insufficient documentation

## 2020-02-12 DIAGNOSIS — R079 Chest pain, unspecified: Secondary | ICD-10-CM | POA: Insufficient documentation

## 2020-02-12 DIAGNOSIS — R110 Nausea: Secondary | ICD-10-CM | POA: Insufficient documentation

## 2020-02-12 DIAGNOSIS — I471 Supraventricular tachycardia, unspecified: Secondary | ICD-10-CM | POA: Insufficient documentation

## 2020-03-06 ENCOUNTER — Other Ambulatory Visit: Payer: Self-pay

## 2020-03-06 ENCOUNTER — Encounter: Payer: Self-pay | Admitting: Adult Health

## 2020-03-06 ENCOUNTER — Other Ambulatory Visit: Payer: Self-pay | Admitting: Adult Health

## 2020-03-06 ENCOUNTER — Ambulatory Visit: Payer: Commercial Managed Care - PPO | Admitting: Adult Health

## 2020-03-06 ENCOUNTER — Telehealth: Payer: Self-pay

## 2020-03-06 VITALS — BP 107/64 | HR 72 | Temp 98.0°F | Resp 16 | Ht 66.0 in | Wt 131.2 lb

## 2020-03-06 DIAGNOSIS — F419 Anxiety disorder, unspecified: Secondary | ICD-10-CM | POA: Diagnosis not present

## 2020-03-06 DIAGNOSIS — R55 Syncope and collapse: Secondary | ICD-10-CM

## 2020-03-06 DIAGNOSIS — Z0000 Encounter for general adult medical examination without abnormal findings: Secondary | ICD-10-CM | POA: Insufficient documentation

## 2020-03-06 DIAGNOSIS — Z1389 Encounter for screening for other disorder: Secondary | ICD-10-CM | POA: Diagnosis not present

## 2020-03-06 DIAGNOSIS — E559 Vitamin D deficiency, unspecified: Secondary | ICD-10-CM

## 2020-03-06 DIAGNOSIS — R42 Dizziness and giddiness: Secondary | ICD-10-CM | POA: Diagnosis not present

## 2020-03-06 DIAGNOSIS — Z8249 Family history of ischemic heart disease and other diseases of the circulatory system: Secondary | ICD-10-CM | POA: Insufficient documentation

## 2020-03-06 DIAGNOSIS — Z87898 Personal history of other specified conditions: Secondary | ICD-10-CM

## 2020-03-06 DIAGNOSIS — Z8489 Family history of other specified conditions: Secondary | ICD-10-CM | POA: Insufficient documentation

## 2020-03-06 DIAGNOSIS — I471 Supraventricular tachycardia: Secondary | ICD-10-CM

## 2020-03-06 DIAGNOSIS — Z13220 Encounter for screening for lipoid disorders: Secondary | ICD-10-CM | POA: Insufficient documentation

## 2020-03-06 DIAGNOSIS — R5383 Other fatigue: Secondary | ICD-10-CM | POA: Insufficient documentation

## 2020-03-06 LAB — POCT URINALYSIS DIPSTICK
Blood, UA: NEGATIVE
Glucose, UA: NEGATIVE
Ketones, UA: NEGATIVE
Leukocytes, UA: NEGATIVE
Nitrite, UA: NEGATIVE
Protein, UA: NEGATIVE
Spec Grav, UA: >=1.03 — AB (ref 1.010–1.025)
Urobilinogen, UA: 0.2 E.U./dL
pH, UA: 6 (ref 5.0–8.0)

## 2020-03-06 MED ORDER — HYDROXYZINE HCL 25 MG PO TABS: 25.0000 mg | ORAL_TABLET | Freq: Three times a day (TID) | ORAL | 0 refills | Status: DC

## 2020-03-06 MED ORDER — ESCITALOPRAM OXALATE 10 MG PO TABS: 10.0000 mg | ORAL_TABLET | Freq: Every day | ORAL | 0 refills | Status: DC

## 2020-03-06 NOTE — Telephone Encounter (Signed)
Noted we will look for fax.

## 2020-03-06 NOTE — Progress Notes (Addendum)
New patient visit   Patient: Valerie Gardner   DOB: Aug 01, 1990   30 y.o. Female  MRN: [id number] Visit Date: 03/06/2020  Today's healthcare provider: Marcille Buffy, FNP   Chief Complaint  Patient presents with  . New Patient (Initial Visit)   Subjective    Valerie Gardner is a 30 y.o. female who presents today as a new patient to establish care.  HPI  Patient presents in office today accompanied with her mother to establish care, patient reports that she has several concerns to address today. Patient reports that in December 2021 she began experiencing symptoms of heavy fatigue, racing heart, sweats, nausea and near syncope episodes. She did have one syncopal episode prior to December, she had vomiting then. Her husband witnessed her eyes roll back in her head it was around September.   She did have congestion prior to all this that felt like seasonal allergies and negative covid test.    She has had some headaches, everyday, pressure in her head. Denies any sinus drainage.    Patient reports that two weeks ago she was prescribed metoprolol to help control her blood pressure and states that since starting medication she has been experiencing dizziness while driving and chest pain/tightness.  She reports fatigue, she has racing heart rate at times.   She does reports some mild to moderate anxiety. Denies any suicidal or homicidal ideations.    Patient reports frequent migraine headaches and states that for the past 2 weeks she has had pain/pressure on top of her head that she describes as squeezing like feeling. Patient reports today that she has been experiencing loss of appetite and dyspnea on exertion.    Patient also complains today of left sided flank pain that she reports has been present since the birth of her child in 2012.  Patient reports that she has a poor diet and eats up to 2 meals a day, she is not actively exercising and states that her sleep  patterns are poor.   Yaz she discontinued birth control  History of possible covid prior to all of these symptoms starting in December.   CTA for DVT and CT abdomen was performed at [facility] in ER on 02/12/2020 without any acute findings.    She is seeing the below cardiologist for SVT, and curently taking metoprolol 25 mg BID. She has had a Zio monitor at her cardiologist. She reports most of the time her heart rate has been normal, she notice 48 heart rate resting once and got up and moved around and the highest she has been reported at 157.   Generic Referral (Routine) - Pending Review  Specialty Diagnoses / Procedures Referred By Contact Referred To Contact  Cardiology Diagnoses  SVT (supraventricular tachycardia) (CMS-HCC)   Loni Muse, MD  [address]  [place], [place] [address]  Phone: [phone]  Fax: [facility]  [address]  [place], [place] [address]  Phone: [phone]  Fax: [phone]     SOFT TISSUES: Unremarkable.   BONES: No acute osseous abnormalities. Mild dextroscoliosis of the thoracic spine.   IMPRESSION:   No pulmonary embolism identified.   Additional chronic and incidental findings, as noted above Exam End: 02/12/20 5:13 AM   Specimen Collected: 02/12/20 6:26 AM Last Resulted: 02/12/20 6:59 AM  Received From: [facility]  Result Received: 03/05/20 9:16 PM  View Encounter     SOFT TISSUES: Unremarkable.  BONES: No acute osseous abnormalities. Mild dextroscoliosis of the thoracic spine.   IMPRESSION:   No pulmonary embolism identified.   Additional chronic and incidental findings, as noted above Exam End: 02/12/20 5:13 AM   Specimen Collected: 02/12/20 6:26 AM Last Resulted: 02/12/20 6:59 AM  Received From: [facility]  Result Received: 03/05/20 9:16 PM  View Encounter     Patient's last menstrual period was 02/16/2020 (exact  date).   Past Medical History:  Diagnosis Date  . [redacted] weeks gestation of pregnancy 04/02/2017  . Anxiety   . PCOS (polycystic ovarian syndrome)   . Supraventricular tachycardia [facility])    Past Surgical History:  Procedure Laterality Date  . NO PAST SURGERIES     Family Status  Relation Name Status  . Sister  (Not Specified)  . MGM  (Not Specified)  . PGM  (Not Specified)  . Mother  Alive  . Father  Alive  . PGF  (Not Specified)   Family History  Problem Relation Age of Onset  . Diabetes Mellitus I Sister   . Melanoma Maternal Grandmother   . Diabetes Mellitus II Paternal Grandmother   . COPD Paternal Grandmother   . Diabetes Paternal Grandmother   . Hypertension Mother   . Hypertension Father   . Brain cancer Paternal Grandfather    Social History   Socioeconomic History  . Marital status: Married    Spouse name: [name]  . Number of children: 2  . Years of education: 73  . Highest education level: Not on file  Occupational History  . Occupation: STAY AT HOME MOM  Tobacco Use  . Smoking status: Former Research scientist (life sciences)  . Smokeless tobacco: Never Used  Vaping Use  . Vaping Use: Never used  Substance and Sexual Activity  . Alcohol use: No  . Drug use: No  . Sexual activity: Yes    Birth control/protection: None  Other Topics Concern  . Not on file  Social History Narrative  . Not on file   Social Determinants of Health   Financial Resource Strain: Not on file  Food Insecurity: Not on file  Transportation Needs: Not on file  Physical Activity: Not on file  Stress: Not on file  Social Connections: Not on file   Outpatient Medications Prior to Visit  Medication Sig  . butalbital-acetaminophen-caffeine (FIORICET) 50-325-40 MG tablet Take 1 tablet by mouth every 4 (four) hours as needed.  . cyclobenzaprine (FLEXERIL) 10 MG tablet Take 10 mg by mouth 3 (three) times daily as needed for muscle spasms.  . metoprolol tartrate (LOPRESSOR) 25 MG tablet Take 25 mg by mouth  2 (two) times daily.  . [DISCONTINUED] hydrOXYzine (ATARAX/VISTARIL) 25 MG tablet Take 25 mg by mouth 3 (three) times daily.  . [DISCONTINUED] COLLAGEN PO Take by mouth daily.  . [DISCONTINUED] drospirenone-ethinyl estradiol (YAZ) 3-0.02 MG tablet Take 1 tablet by mouth daily.  . [DISCONTINUED] Multiple Vitamins-Minerals (WOMENS ONE DAILY PO) Take by mouth.   No facility-administered medications prior to visit.   No Known Allergies  Immunization History  Administered Date(s) Administered  . Tdap 03/19/2017  . Varicella 07/01/2015    Health Maintenance  Topic Date Due  . Hepatitis C Screening  Never done  . COVID-19 Vaccine (1) Never done  . INFLUENZA VACCINE  Never done  . PAP-Cervical Cytology Screening  09/09/2022  . PAP SMEAR-Modifier  09/09/2022  . TETANUS/TDAP  03/20/2027  . HIV Screening  Completed    Patient Care Team: Doreen Beam, FNP as  PCP - General (Family Medicine)  Review of Systems  Constitutional: Positive for appetite change and fatigue. Negative for activity change, chills, diaphoresis and fever.  HENT: Positive for sinus pressure. Negative for congestion, dental problem, drooling, ear discharge, ear pain, facial swelling, hearing loss, mouth sores, nosebleeds, postnasal drip, rhinorrhea, sinus pain, sneezing, sore throat, tinnitus, trouble swallowing and voice change.   Eyes: Positive for visual disturbance. Negative for photophobia, pain, discharge, redness and itching.  Respiratory: Positive for chest tightness and shortness of breath. Negative for apnea, cough, choking, wheezing and stridor.   Cardiovascular: Positive for chest pain and palpitations. Negative for leg swelling.  Gastrointestinal: Positive for nausea. Negative for abdominal distention, abdominal pain, anal bleeding, blood in stool, constipation, diarrhea, rectal pain and vomiting.  Genitourinary: Positive for flank pain. Negative for decreased urine volume, difficulty urinating,  dyspareunia, dysuria, enuresis, frequency, genital sores, hematuria, menstrual problem, pelvic pain, urgency, vaginal bleeding, vaginal discharge and vaginal pain.  Skin: Negative.   Neurological: Positive for dizziness, weakness, numbness and headaches. Negative for tremors, seizures, syncope, facial asymmetry, speech difficulty and light-headedness.  Hematological: Negative.  Negative for adenopathy. Does not bruise/bleed easily.  Psychiatric/Behavioral: Negative for agitation, behavioral problems, confusion, decreased concentration, dysphoric mood, hallucinations, self-injury, sleep disturbance and suicidal ideas. The patient is nervous/anxious. The patient is not hyperactive.   All other systems reviewed and are negative.   Last CBC Lab Results  Component Value Date   WBC 5.1 05/17/2019   HGB 12.7 05/17/2019   HCT 39.7 05/17/2019   MCV 88 05/17/2019   MCH 28.3 05/17/2019   RDW 13.5 05/17/2019   PLT 267 [id number]   Last metabolic panel Lab Results  Component Value Date   GLUCOSE 89 05/17/2019   NA 141 05/17/2019   K 4.6 05/17/2019   CL 105 05/17/2019   CO2 21 05/17/2019   BUN 9 05/17/2019   CREATININE 0.76 05/17/2019   GFRNONAA 107 05/17/2019   GFRAA 123 05/17/2019   CALCIUM 8.7 05/17/2019   PROT 7.1 05/17/2019   ALBUMIN 4.6 05/17/2019   LABGLOB 2.5 05/17/2019   AGRATIO 1.8 05/17/2019   BILITOT 0.2 05/17/2019   ALKPHOS 51 05/17/2019   AST 18 05/17/2019   ALT 18 05/17/2019   ANIONGAP 10 03/13/2018   Last lipids No results found for: CHOL, HDL, LDLCALC, LDLDIRECT, TRIG, CHOLHDL Last hemoglobin A1c No results found for: HGBA1C Last thyroid functions Lab Results  Component Value Date   TSH 2.409 05/24/2014   Last vitamin D No results found for: 25OHVITD2, 25OHVITD3, VD25OH Last vitamin B12 and Folate No results found for: VITAMINB12, FOLATE    Objective    BP 107/64   Pulse 72   Temp 98 F (36.7 C) (Oral)   Resp 16   Ht 5\' 6"  (1.676 m)   Wt 131 lb 3.2  oz (59.5 kg)   LMP 02/16/2020 (Exact Date)   SpO2 100%   Breastfeeding No   BMI 21.18 kg/m  Physical Exam Vitals reviewed.  Constitutional:      General: She is not in acute distress.    Appearance: She is well-developed. She is not ill-appearing, toxic-appearing or diaphoretic.     Interventions: She is not intubated.    Comments: Patient is alert and oriented and responsive to questions Engages in eye contact with provider. Speaks in full sentences without any pauses without any shortness of breath or distress.    HENT:     Head: Normocephalic and atraumatic.     Right Ear: External  ear normal. There is no impacted cerumen.     Left Ear: External ear normal. There is no impacted cerumen.     Nose: Nose normal. No congestion.     Mouth/Throat:     Mouth: Mucous membranes are moist.     Pharynx: No oropharyngeal exudate or posterior oropharyngeal erythema.  Eyes:     General: Lids are normal. No scleral icterus.       Right eye: No discharge.        Left eye: No discharge.     Conjunctiva/sclera: Conjunctivae normal.     Right eye: Right conjunctiva is not injected. No exudate or hemorrhage.    Left eye: Left conjunctiva is not injected. No exudate or hemorrhage.    Pupils: Pupils are equal, round, and reactive to light.  Neck:     Thyroid: No thyroid mass or thyromegaly.     Vascular: Normal carotid pulses. No carotid bruit, hepatojugular reflux or JVD.     Trachea: Trachea and phonation normal. No tracheal tenderness or tracheal deviation.     Meningeal: Brudzinski's sign and Kernig's sign absent.  Cardiovascular:     Rate and Rhythm: Normal rate and regular rhythm.     Pulses: Normal pulses.          Radial pulses are 2+ on the right side and 2+ on the left side.       Dorsalis pedis pulses are 2+ on the right side and 2+ on the left side.       Posterior tibial pulses are 2+ on the right side and 2+ on the left side.     Heart sounds: Normal heart sounds, S1 normal and S2  normal. Heart sounds not distant. No murmur heard. No friction rub. No gallop.   Pulmonary:     Effort: Pulmonary effort is normal. No tachypnea, bradypnea, accessory muscle usage or respiratory distress. She is not intubated.     Breath sounds: Normal breath sounds. No stridor. No wheezing or rales.  Chest:     Chest wall: No tenderness.  Breasts:     Right: No supraclavicular adenopathy.     Left: No supraclavicular adenopathy.    Abdominal:     General: Bowel sounds are normal. There is no distension or abdominal bruit.     Palpations: Abdomen is soft. There is no shifting dullness, fluid wave, hepatomegaly, splenomegaly, mass or pulsatile mass.     Tenderness: There is no abdominal tenderness. There is no right CVA tenderness, left CVA tenderness, guarding or rebound.     Hernia: No hernia is present.  Musculoskeletal:        General: No tenderness or deformity. Normal range of motion.     Cervical back: Full passive range of motion without pain, normal range of motion and neck supple. No edema, erythema or rigidity. No spinous process tenderness or muscular tenderness. Normal range of motion.  Lymphadenopathy:     Head:     Right side of head: No submental, submandibular, tonsillar, preauricular, posterior auricular or occipital adenopathy.     Left side of head: No submental, submandibular, tonsillar, preauricular, posterior auricular or occipital adenopathy.     Cervical: No cervical adenopathy.     Right cervical: No superficial, deep or posterior cervical adenopathy.    Left cervical: No superficial, deep or posterior cervical adenopathy.     Upper Body:     Right upper body: No supraclavicular or pectoral adenopathy.     Left upper body: No  supraclavicular or pectoral adenopathy.  Skin:    General: Skin is warm and dry.     Coloration: Skin is not pale.     Findings: No abrasion, bruising, burn, ecchymosis, erythema, lesion, petechiae or rash.     Nails: There is no  clubbing.  Neurological:     General: No focal deficit present.     Mental Status: She is alert and oriented to person, place, and time.     GCS: GCS eye subscore is 4. GCS verbal subscore is 5. GCS motor subscore is 6.     Cranial Nerves: No cranial nerve deficit.     Sensory: No sensory deficit.     Motor: No weakness, tremor, atrophy, abnormal muscle tone or seizure activity.     Coordination: Coordination normal.     Gait: Gait normal.     Deep Tendon Reflexes: Reflexes are normal and symmetric. Reflexes normal. Babinski sign absent on the right side. Babinski sign absent on the left side.     Reflex Scores:      Tricep reflexes are 2+ on the right side and 2+ on the left side.      Bicep reflexes are 2+ on the right side and 2+ on the left side.      Brachioradialis reflexes are 2+ on the right side and 2+ on the left side.      Patellar reflexes are 2+ on the right side and 2+ on the left side.      Achilles reflexes are 2+ on the right side and 2+ on the left side. Psychiatric:        Mood and Affect: Mood normal.        Speech: Speech normal.        Behavior: Behavior normal.        Thought Content: Thought content normal.        Judgment: Judgment normal.     Depression Screen PHQ 2/9 Scores 03/06/2020 03/06/2020  PHQ - 2 Score 0 0  PHQ- 9 Score 9 -   Results for orders placed or performed in visit on 03/06/20  POCT urinalysis dipstick  Result Value Ref Range   Color, UA yellow    Clarity, UA clear    Glucose, UA Negative Negative   Bilirubin, UA small    Ketones, UA negative    Spec Grav, UA >=1.030 (A) 1.010 - 1.025   Blood, UA negative    pH, UA 6.0 5.0 - 8.0   Protein, UA Negative Negative   Urobilinogen, UA 0.2 0.2 or 1.0 E.U./dL   Nitrite, UA negative    Leukocytes, UA Negative Negative   Appearance     Odor      Assessment & Plan       Anxiety - Plan: hydrOXYzine (ATARAX/VISTARIL) 25 MG tablet, escitalopram (LEXAPRO) 10 MG tablet  Screening for blood  or protein in urine - Plan: POCT urinalysis dipstick  Vitamin D deficiency  Postural dizziness with near syncope - Plan: CBC with Differential/Platelet, Comprehensive metabolic panel, SAY+[id number], B12, HgB A1c, SAR CoV2 Serology (COVID 19)AB(IGG)IA, Ambulatory referral to Neurology, D-Dimer, Quantitative, CANCELED: Ambulatory referral to Neurology  Family history of syncope  Fatigue, unspecified type - Plan: Hepatitis panel, acute  Family history of DVT - Plan: PT and PTT, Fibrinogen Negative for DVT January 2022 at [facility] copied into chart for continuity of care.   She does have mild to moderate anxiety/ depression will start Lexapro.   Keep follow up with  cardiology.   Labs today.   referral to neurology for frther work up dizziness, pre syncopal episodes.   Meds ordered this encounter  Medications  . hydrOXYzine (ATARAX/VISTARIL) 25 MG tablet    Sig: Take 1-2 tablets (25-50 mg total) by mouth 3 (three) times daily.    Dispense:  30 tablet    Refill:  0  . escitalopram (LEXAPRO) 10 MG tablet    Sig: Take 1 tablet (10 mg total) by mouth daily.    Dispense:  30 tablet    Refill:  0     Discussed known black box warning for anti depression/ anxiety medication. Need to report any behavioral changes right, if any homicidal or suicidal thoughts or ideas seek medical attention right away. Call 911.    Red Flags discussed. The patient was given clear instructions to go to ER or return to medical center if any red flags develop, symptoms do not improve, worsen or new problems develop. They verbalized understanding.  Return in about 3 weeks (around 03/27/2020), or if symptoms worsen or fail to improve, for at any time for any worsening symptoms, Go to Emergency room/ urgent care if worse.     The entirety of the information documented in the History of Present Illness, Review of Systems and Physical Exam were personally obtained by me. Portions of this information were  initially documented by the CMA and reviewed by me for thoroughness and accuracy.      Marcille Buffy, [place] [phone] (phone) [phone] (fax)  [place]

## 2020-03-06 NOTE — Telephone Encounter (Signed)
Patient wanted to let you know that her husband may need extended FMLA papers completed for his job as he is the one staying with [employer].

## 2020-03-06 NOTE — Patient Instructions (Signed)
Near-Syncope Near-syncope is when you suddenly get weak or dizzy, or you feel like you might pass out (faint). This may also be called presyncope. This is due to a lack of blood flow to the brain. During an episode of near-syncope, you may:  Feel dizzy, weak, or light-headed.  Feel sick to your stomach (nauseous).  See all white or all black.  See spots.  Have cold, clammy skin. This condition is caused by a sudden decrease in blood flow to the brain. This decrease can result from various causes, but most of those causes are not dangerous. However, near-syncope may be a sign of a serious medical problem, so it is important to seek medical care. Follow these instructions at home: Medicines  Take over-the-counter and prescription medicines only as told by your doctor.  If you are taking blood pressure or heart medicine, get up slowly and spend many minutes getting ready to sit and then stand. This can help with dizziness. General instructions  Be aware of any changes in your symptoms.  Talk with your doctor about your symptoms. You may need to have testing to find the cause of your near-syncope.  If you start to feel like you might pass out, lie down right away. Raise (elevate) your feet above the level of your heart. Breathe deeply and steadily. Wait until all of the symptoms are gone.  Have someone stay with you until you feel stable.  Do not drive, use machinery, or play sports until your doctor says it is okay.  Drink enough fluid to keep your pee (urine) pale yellow.  Keep all follow-up visits as told by your doctor. This is important. Get help right away if you:  Have a seizure.  Have pain in your: ? Chest. ? Belly (abdomen). ? Back.  Faint once or more than once.  Have a very bad headache.  Are bleeding from your mouth or butt.  Have black or tarry poop (stool).  Have a very fast or uneven heartbeat (palpitations).  Are mixed up (confused).  Have trouble  walking.  Are very weak.  Have trouble seeing. These symptoms may be an emergency. Do not wait to see if the symptoms will go away. Get medical help right away. Call your local emergency services (911 in the [place] Do not drive yourself to the hospital. Summary  Near-syncope is when you suddenly get weak or dizzy, or you feel like you might pass out (faint).  This condition is caused by a lack of blood flow to the brain.  Near-syncope may be a sign of a serious medical problem, so it is important to seek medical care. This information is not intended to replace advice given to you by your health care provider. Make sure you discuss any questions you have with your health care provider. Document Revised: 05/14/2018 Document Reviewed: 12/09/2017 Elsevier Patient Education  2021 [place]. Syncope Syncope is when you pass out (faint) for a short time. It is caused by a sudden decrease in blood flow to the brain. Signs that you may be about to pass out include:  Feeling dizzy or light-headed.  Feeling sick to your stomach (nauseous).  Seeing all white or all black.  Having cold, clammy skin. If you pass out, get help right away. Call your local emergency services (911 in the [place] Do not drive yourself to the hospital. Follow these instructions at home: Watch for any changes in your symptoms. Take these actions to stay safe and help with  your symptoms: Lifestyle  Do not drive, use machinery, or play sports until your doctor says it is okay.  Do not drink alcohol.  Do not use any products that contain nicotine or tobacco, such as cigarettes and e-cigarettes. If you need help quitting, ask your doctor.  Drink enough fluid to keep your pee (urine) pale yellow. General instructions  Take over-the-counter and prescription medicines only as told by your doctor.  If you are taking blood pressure or heart medicine, sit up and stand up slowly. Spend a few minutes getting ready to  sit and then stand. This can help you feel less dizzy.  Have someone stay with you until you feel stable.  If you start to feel like you might pass out, lie down right away and raise (elevate) your feet above the level of your heart. Breathe deeply and steadily. Wait until all of the symptoms are gone.  Keep all follow-up visits as told by your doctor. This is important. Get help right away if:  You have a very bad headache.  You pass out once or more than once.  You have pain in your chest, belly, or back.  You have a very fast or uneven heartbeat (palpitations).  It hurts to breathe.  You are bleeding from your mouth or your bottom (rectum).  You have black or tarry poop (stool).  You have jerky movements that you cannot control (seizure).  You are confused.  You have trouble walking.  You are very weak.  You have vision problems. These symptoms may be an emergency. Do not wait to see if the symptoms will go away. Get medical help right away. Call your local emergency services (911 in the [place] Do not drive yourself to the hospital. Summary  Syncope is when you pass out (faint) for a short time. It is caused by a sudden decrease in blood flow to the brain.  Signs that you may be about to faint include feeling dizzy, light-headed, or sick to your stomach, seeing all white or all black, or having cold, clammy skin.  If you start to feel like you might pass out, lie down right away and raise (elevate) your feet above the level of your heart. Breathe deeply and steadily. Wait until all of the symptoms are gone. This information is not intended to replace advice given to you by your health care provider. Make sure you discuss any questions you have with your health care provider. Document Revised: 03/02/2019 Document Reviewed: 03/04/2017 Elsevier Patient Education  2021 [employer].

## 2020-03-06 NOTE — Telephone Encounter (Signed)
Fyi. KW 

## 2020-03-07 ENCOUNTER — Telehealth: Payer: Self-pay

## 2020-03-07 ENCOUNTER — Encounter: Payer: Self-pay | Admitting: Adult Health

## 2020-03-07 ENCOUNTER — Ambulatory Visit
Admission: RE | Admit: 2020-03-07 | Discharge: 2020-03-07 | Disposition: A | Discharge status: Home / Self Care | Payer: Commercial Managed Care - PPO | Source: Ambulatory Visit | Attending: Adult Health | Admitting: Adult Health

## 2020-03-07 ENCOUNTER — Other Ambulatory Visit: Payer: Self-pay | Admitting: Adult Health

## 2020-03-07 ENCOUNTER — Other Ambulatory Visit: Payer: Self-pay

## 2020-03-07 DIAGNOSIS — Z789 Other specified health status: Secondary | ICD-10-CM | POA: Insufficient documentation

## 2020-03-07 DIAGNOSIS — R0602 Shortness of breath: Secondary | ICD-10-CM | POA: Diagnosis present

## 2020-03-07 DIAGNOSIS — R42 Dizziness and giddiness: Secondary | ICD-10-CM | POA: Diagnosis present

## 2020-03-07 DIAGNOSIS — R7989 Other specified abnormal findings of blood chemistry: Secondary | ICD-10-CM

## 2020-03-07 DIAGNOSIS — Z8249 Family history of ischemic heart disease and other diseases of the circulatory system: Secondary | ICD-10-CM | POA: Diagnosis present

## 2020-03-07 DIAGNOSIS — R0789 Other chest pain: Secondary | ICD-10-CM

## 2020-03-07 DIAGNOSIS — R55 Syncope and collapse: Secondary | ICD-10-CM | POA: Insufficient documentation

## 2020-03-07 DIAGNOSIS — R5383 Other fatigue: Secondary | ICD-10-CM

## 2020-03-07 DIAGNOSIS — I471 Supraventricular tachycardia, unspecified: Secondary | ICD-10-CM | POA: Insufficient documentation

## 2020-03-07 LAB — CBC WITH DIFFERENTIAL/PLATELET
Basophils Absolute: 0.1 10*3/uL (ref 0.0–0.2)
Basos: 1 %
EOS (ABSOLUTE): 0.1 10*3/uL (ref 0.0–0.4)
Eos: 2 %
Hematocrit: 38.9 % (ref 34.0–46.6)
Hemoglobin: 12.9 g/dL (ref 11.1–15.9)
Immature Grans (Abs): 0 10*3/uL (ref 0.0–0.1)
Immature Granulocytes: 0 %
Lymphocytes Absolute: 2.2 10*3/uL (ref 0.7–3.1)
Lymphs: 40 %
MCH: 30.1 pg (ref 26.6–33.0)
MCHC: 33.2 g/dL (ref 31.5–35.7)
MCV: 91 fL (ref 79–97)
Monocytes Absolute: 0.5 10*3/uL (ref 0.1–0.9)
Monocytes: 8 %
Neutrophils Absolute: 2.7 10*3/uL (ref 1.4–7.0)
Neutrophils: 49 %
Platelets: 218 10*3/uL (ref 150–450)
RBC: 4.29 x10E6/uL (ref 3.77–5.28)
RDW: 11.5 % — ABNORMAL LOW (ref 11.7–15.4)
WBC: 5.6 10*3/uL (ref 3.4–10.8)

## 2020-03-07 LAB — D-DIMER, QUANTITATIVE: D-DIMER: 0.84 mg/L FEU — ABNORMAL HIGH (ref 0.00–0.49)

## 2020-03-07 LAB — COMPREHENSIVE METABOLIC PANEL
ALT: 21 IU/L (ref 0–32)
AST: 17 IU/L (ref 0–40)
Albumin/Globulin Ratio: 2.2 (ref 1.2–2.2)
Albumin: 4.6 g/dL (ref 3.9–5.0)
Alkaline Phosphatase: 60 IU/L (ref 44–121)
BUN/Creatinine Ratio: 9 (ref 9–23)
BUN: 6 mg/dL (ref 6–20)
Bilirubin Total: <0.2 mg/dL (ref 0.0–1.2)
CO2: 24 mmol/L (ref 20–29)
Calcium: 8.9 mg/dL (ref 8.7–10.2)
Chloride: 105 mmol/L (ref 96–106)
Creatinine, Ser: 0.7 mg/dL (ref 0.57–1.00)
GFR calc Af Amer: 135 mL/min/{1.73_m2} (ref >59)
GFR calc non Af Amer: 117 mL/min/{1.73_m2} (ref >59)
Globulin, Total: 2.1 g/dL (ref 1.5–4.5)
Glucose: 94 mg/dL (ref 65–99)
Potassium: 4.3 mmol/L (ref 3.5–5.2)
Sodium: 145 mmol/L — ABNORMAL HIGH (ref 134–144)
Total Protein: 6.7 g/dL (ref 6.0–8.5)

## 2020-03-07 LAB — TSH+T4F+T3FREE
Free T4: 1.12 ng/dL (ref 0.82–1.77)
T3, Free: 3.9 pg/mL (ref 2.0–4.4)
TSH: 3.45 u[IU]/mL (ref 0.450–4.500)

## 2020-03-07 LAB — HEPATITIS PANEL, ACUTE
Hep A IgM: NEGATIVE
Hep B C IgM: NEGATIVE
Hep C Virus Ab: <0.1 s/co ratio (ref 0.0–0.9)
Hepatitis B Surface Ag: NEGATIVE

## 2020-03-07 LAB — HEMOGLOBIN A1C
Est. average glucose Bld gHb Est-mCnc: 97 mg/dL
Hgb A1c MFr Bld: 5 % (ref 4.8–5.6)

## 2020-03-07 LAB — SAR COV2 SEROLOGY (COVID19)AB(IGG),IA
SARS-CoV-2 Semi-Quant IgG Ab: <13 AU/mL (ref <13.0)
SARS-CoV-2 Spike Ab Interp: NEGATIVE

## 2020-03-07 LAB — VITAMIN B12: Vitamin B-12: 534 pg/mL (ref 232–1245)

## 2020-03-07 MED ORDER — IOHEXOL 350 MG/ML SOLN
100.0000 mL | Freq: Once | INTRAVENOUS | Status: AC | PRN
  Administered 2020-03-07: 100 mL via INTRAVENOUS

## 2020-03-07 NOTE — Progress Notes (Signed)
Cbc, cmp, TSH, B12, a1c all within normal.  She does have mildly elevated d dimer, I am ordering a DVT ultrasound on her legs and a CT angiogram. Since family/ sister  has a history of blood clots, will send to hematology as well. Please place referral.

## 2020-03-07 NOTE — Progress Notes (Signed)
NO DVT  clot seen on chest CT angiography.

## 2020-03-07 NOTE — Addendum Note (Signed)
Addended by: Doreen Beam on: 03/07/2020 02:56 PM   Modules accepted: Orders

## 2020-03-07 NOTE — Progress Notes (Signed)
Advise emergency room if any symptoms persist change or worsen at anytime.

## 2020-03-07 NOTE — Progress Notes (Signed)
Negative DVT ultrasound for lower extremities,   Did refer her to hematology given her family history, if any symptoms worsen at anytime please be seen immediately in the emergency room.

## 2020-03-07 NOTE — Addendum Note (Signed)
Addended by: Doreen Beam on: 03/07/2020 10:55 AM   Modules accepted: Orders

## 2020-03-07 NOTE — Progress Notes (Signed)
Patient's last menstrual period was 02/16/2020 (exact date).   Other chest pain - Plan: CT Angio Chest W/Cm &/Or Wo Cm, US Venous Img Lower Bilateral (DVT)  Postural dizziness with near syncope - Plan: CT Angio Chest W/Cm &/Or Wo Cm, US Venous Img Lower Bilateral (DVT)  Family history of DVT- sister.  - Plan: CT Angio Chest W/Cm &/Or Wo Cm, US Venous Img Lower Bilateral (DVT)  Fatigue, unspecified type - Plan: CT Angio Chest W/Cm &/Or Wo Cm, US Venous Img Lower Bilateral (DVT)  SVT (supraventricular tachycardia) (HCC) - Plan: CT Angio Chest W/Cm &/Or Wo Cm, US Venous Img Lower Bilateral (DVT)  Shortness of breath - Plan: CT Angio Chest W/Cm &/Or Wo Cm, US Venous Img Lower Bilateral (DVT)  Oral contraceptive intolerance- was taking and discontinued.   Positive D dimer  See lab note.   Orders Placed This Encounter  Procedures  . CT Angio Chest W/Cm &/Or Wo Cm  . US Venous Img Lower Bilateral (DVT)

## 2020-03-07 NOTE — Telephone Encounter (Signed)
-----   Message from Doreen Beam, [place] sent at 03/07/2020 10:58 AM EST ----- Advise emergency room if any symptoms persist change or worsen at anytime.

## 2020-03-08 ENCOUNTER — Other Ambulatory Visit: Payer: Self-pay | Admitting: Adult Health

## 2020-03-08 LAB — PT AND PTT
INR: 1 (ref 0.9–1.2)
Prothrombin Time: 10.3 s (ref 9.1–12.0)
aPTT: 24 s (ref 24–33)

## 2020-03-08 LAB — FIBRINOGEN: Fibrinogen: 197 mg/dL (ref 193–507)

## 2020-03-08 MED ORDER — BUTALBITAL-APAP-CAFFEINE 50-325-40 MG PO TABS: 1.0000 | ORAL_TABLET | Freq: Four times a day (QID) | ORAL | 0 refills | Status: DC | PRN

## 2020-03-08 NOTE — Progress Notes (Signed)
Cbc, cmp, TSH, B12, a1c all within normal.  She does have mildly elevated d dimer, I am ordering a DVT ultrasound on her legs and a CT angiogram. Since family/ sister  has a history of blood clots, will send to hematology as well for any further recommended work up. Please place referral.   PTT and PT clotting times within normal. Fibrogen is within normal as well.  Keep neurology appointment 03/09/2020.

## 2020-03-09 ENCOUNTER — Encounter: Payer: Self-pay | Admitting: Diagnostic Neuroimaging

## 2020-03-09 ENCOUNTER — Telehealth: Payer: Self-pay | Admitting: Adult Health

## 2020-03-09 ENCOUNTER — Telehealth: Payer: Self-pay

## 2020-03-09 ENCOUNTER — Ambulatory Visit: Payer: Commercial Managed Care - PPO | Admitting: Diagnostic Neuroimaging

## 2020-03-09 ENCOUNTER — Other Ambulatory Visit: Payer: Self-pay | Admitting: Adult Health

## 2020-03-09 VITALS — BP 119/83 | HR 99 | Ht 66.0 in | Wt 126.0 lb

## 2020-03-09 DIAGNOSIS — R42 Dizziness and giddiness: Secondary | ICD-10-CM | POA: Diagnosis not present

## 2020-03-09 DIAGNOSIS — R110 Nausea: Secondary | ICD-10-CM

## 2020-03-09 MED ORDER — ONDANSETRON HCL 4 MG PO TABS: 4.0000 mg | ORAL_TABLET | Freq: Three times a day (TID) | ORAL | 0 refills | Status: DC | PRN

## 2020-03-09 NOTE — Telephone Encounter (Signed)
Please review. Does pt need to be seen?

## 2020-03-09 NOTE — Telephone Encounter (Signed)
She was just seen, will send in Zofran to take as directed on prescription PRN. She will need to schedule a follow up if any symptoms persist. Seek care immediately if worsens over the weekend.

## 2020-03-09 NOTE — Telephone Encounter (Signed)
Copied from [place] [id number]. Topic: General - Other >> Mar 09, 2020  1:03 PM Leward Quan A wrote: Reason for CRM: Patient mom Benjamine Mola called in to inquire if Valerie Gardner can please call patient in something since she have been sick on her stomach all morning. No other info given. Please advise Ph# [phone]

## 2020-03-09 NOTE — Telephone Encounter (Signed)
Patient called to ask if the doctor can send a script for some nausea medication.  Stated she has been having nausea off and on and would like some medication.  Please advise and call patient to confirm at [phone]

## 2020-03-09 NOTE — Telephone Encounter (Signed)
See phone encounter prescription has been sent in. [facility]

## 2020-03-09 NOTE — Telephone Encounter (Signed)
Advised patient as below.  

## 2020-03-09 NOTE — Progress Notes (Signed)
[facility]  PATIENT: Valerie Gardner DOB: 03-29-1990  REFERRING CLINICIAN: Flinchum, Kelby Aline, F* HISTORY FROM: patient  REASON FOR VISIT: new consult    HISTORICAL  CHIEF COMPLAINT:  Chief Complaint  Patient presents with  . New Patient (Initial Visit)    "Was sick in December and has had continuous dizziness and nausea" Room 7, mom Elizabeth in room    HISTORY OF PRESENT ILLNESS:   30 year old female here for evaluation of dizziness.  December 2021 patient had onset of palpitations, feeling hot, pins-and-needles, dizziness, lightheadedness, loss of appetite, nausea and headaches.  Patient went to urgent care and ER and PCP for evaluation.  She has been to cardiology as well.  She was started on metoprolol for palpitations but this has not helped.  No specific triggering or aggravating factors.  Patient has been under some stress related to taking care of 3 children under age 68 at home.  Has been started on Lexapro for anxiety a couple days ago.   REVIEW OF SYSTEMS: Full 14 system review of systems performed and negative with exception of: As per HPI.  ALLERGIES: No Known Allergies  HOME MEDICATIONS: Outpatient Medications Prior to Visit  Medication Sig Dispense Refill  . butalbital-acetaminophen-caffeine (FIORICET) 50-325-40 MG tablet Take 1 tablet by mouth every 6 (six) hours as needed for headache. 30 tablet 0  . escitalopram (LEXAPRO) 10 MG tablet Take 1 tablet (10 mg total) by mouth daily. 30 tablet 0  . hydrOXYzine (ATARAX/VISTARIL) 25 MG tablet Take 1-2 tablets (25-50 mg total) by mouth 3 (three) times daily. 30 tablet 0  . metoprolol tartrate (LOPRESSOR) 25 MG tablet Take 25 mg by mouth 2 (two) times daily.    . cyclobenzaprine (FLEXERIL) 10 MG tablet Take 10 mg by mouth 3 (three) times daily as needed for muscle spasms.     No facility-administered medications prior to visit.    PAST MEDICAL HISTORY: Past Medical History:  Diagnosis  Date  . [redacted] weeks gestation of pregnancy 04/02/2017  . Anxiety   . PCOS (polycystic ovarian syndrome)   . Supraventricular tachycardia ([place])     PAST SURGICAL HISTORY: Past Surgical History:  Procedure Laterality Date  . NO PAST SURGERIES      FAMILY HISTORY: Family History  Problem Relation Age of Onset  . Diabetes Mellitus I Sister   . Melanoma Maternal Grandmother   . Diabetes Mellitus II Paternal Grandmother   . COPD Paternal Grandmother   . Diabetes Paternal Grandmother   . Hypertension Mother   . Hypertension Father   . Brain cancer Paternal Grandfather     SOCIAL HISTORY: Social History   Socioeconomic History  . Marital status: Married    Spouse name: [name]  . Number of children: 2  . Years of education: 65  . Highest education level: Not on file  Occupational History  . Occupation: STAY AT HOME MOM  Tobacco Use  . Smoking status: Former Research scientist (life sciences)  . Smokeless tobacco: Never Used  Vaping Use  . Vaping Use: Never used  Substance and Sexual Activity  . Alcohol use: No  . Drug use: No  . Sexual activity: Yes    Birth control/protection: None  Other Topics Concern  . Not on file  Social History Narrative   Lives with husband and 3 children   Right Handed   Drinks very rarely caffeine   Social Determinants of Health   Financial Resource Strain: Not on file  Food Insecurity: Not on file  Transportation Needs: Not on file  Physical Activity: Not on file  Stress: Not on file  Social Connections: Not on file  Intimate Partner Violence: Not on file     PHYSICAL EXAM  GENERAL EXAM/CONSTITUTIONAL: Vitals:  Vitals:   03/09/20 1011  BP: 119/83  Pulse: 99  Weight: 126 lb (57.2 kg)  Height: 5\' 6"  (1.676 m)   Body mass index is 20.34 kg/m. Wt Readings from Last 3 Encounters:  03/09/20 126 lb (57.2 kg)  03/06/20 131 lb 3.2 oz (59.5 kg)  09/30/19 131 lb (59.4 kg)    Patient is in no distress; well developed, nourished and groomed; neck is  supple  CARDIOVASCULAR:  Examination of carotid arteries is normal; no carotid bruits  Regular rate and rhythm, no murmurs  Examination of peripheral vascular system by observation and palpation is normal  EYES:  Ophthalmoscopic exam of optic discs and posterior segments is normal; no papilledema or hemorrhages No exam data present  MUSCULOSKELETAL:  Gait, strength, tone, movements noted in Neurologic exam below  NEUROLOGIC: MENTAL STATUS:  No flowsheet data found.  awake, alert, oriented to person, place and time  recent and remote memory intact  normal attention and concentration  language fluent, comprehension intact, naming intact  fund of knowledge appropriate  CRANIAL NERVE:   2nd - no papilledema on fundoscopic exam  2nd, 3rd, 4th, 6th - pupils equal and reactive to light, visual fields full to confrontation, extraocular muscles intact, no nystagmus  5th - facial sensation symmetric  7th - facial strength symmetric  8th - hearing intact  9th - palate elevates symmetrically, uvula midline  11th - shoulder shrug symmetric  12th - tongue protrusion midline  MOTOR:   normal bulk and tone, full strength in the BUE, BLE  SENSORY:   normal and symmetric to light touch, temperature, vibration  COORDINATION:   finger-nose-finger, fine finger movements normal  REFLEXES:   deep tendon reflexes present and symmetric  GAIT/STATION:   narrow based gait     DIAGNOSTIC DATA (LABS, IMAGING, TESTING) - I reviewed patient records, labs, notes, testing and imaging myself where available.  Lab Results  Component Value Date   WBC 5.6 03/06/2020   HGB 12.9 03/06/2020   HCT 38.9 03/06/2020   MCV 91 03/06/2020   PLT 218 03/06/2020      Component Value Date/Time   NA 145 (H) 03/06/2020 1127   K 4.3 03/06/2020 1127   CL 105 03/06/2020 1127   CO2 24 03/06/2020 1127   GLUCOSE 94 03/06/2020 1127   GLUCOSE 85 03/13/2018 2000   BUN 6 03/06/2020 1127    CREATININE 0.70 03/06/2020 1127   CALCIUM 8.9 03/06/2020 1127   PROT 6.7 03/06/2020 1127   ALBUMIN 4.6 03/06/2020 1127   AST 17 03/06/2020 1127   ALT 21 03/06/2020 1127   ALKPHOS 60 03/06/2020 1127   BILITOT <0.2 03/06/2020 1127   GFRNONAA 117 03/06/2020 1127   GFRAA 135 03/06/2020 1127   No results found for: CHOL, HDL, LDLCALC, LDLDIRECT, TRIG, CHOLHDL Lab Results  Component Value Date   HGBA1C 5.0 03/06/2020   Lab Results  Component Value Date   WNUUVOZD66 440 03/06/2020   Lab Results  Component Value Date   TSH 3.450 03/06/2020        ASSESSMENT AND PLAN  30 y.o. year old female here with constellation of symptoms including dizziness, palpitation, nausea, headaches, anxiety since December 2021.  Neurologic examination unremarkable.  We will proceed with MRI of the brain  to rule out other secondary causes.  Dx:  1. Dizziness       PLAN:  INTERMITTENT DIZZINESS / PALPITATIONS / NAUSEA / HEADACHES / ANXIETY - check MRI brain (rule out demyelinating disease) - follow up with cardiology and PCP  Orders Placed This Encounter  Procedures  . MR BRAIN W WO CONTRAST   Return for pending if symptoms worsen or fail to improve.    Penni Bombard, MD 03/12/348, 0:93 PM Certified in Neurology, Neurophysiology and Neuroimaging  [facility] [address] [place], [place] [address] [phone]

## 2020-03-13 ENCOUNTER — Encounter: Payer: Self-pay | Admitting: Oncology

## 2020-03-13 ENCOUNTER — Inpatient Hospital Stay: Payer: Commercial Managed Care - PPO | Attending: Oncology | Admitting: Oncology

## 2020-03-13 ENCOUNTER — Telehealth: Payer: Self-pay | Admitting: Diagnostic Neuroimaging

## 2020-03-13 ENCOUNTER — Inpatient Hospital Stay: Payer: Commercial Managed Care - PPO

## 2020-03-13 ENCOUNTER — Encounter: Payer: Self-pay | Admitting: *Deleted

## 2020-03-13 VITALS — BP 111/69 | HR 72 | Temp 97.7°F | Wt 126.9 lb

## 2020-03-13 DIAGNOSIS — R5383 Other fatigue: Secondary | ICD-10-CM | POA: Diagnosis not present

## 2020-03-13 DIAGNOSIS — Z836 Family history of other diseases of the respiratory system: Secondary | ICD-10-CM | POA: Insufficient documentation

## 2020-03-13 DIAGNOSIS — R110 Nausea: Secondary | ICD-10-CM | POA: Insufficient documentation

## 2020-03-13 DIAGNOSIS — R0602 Shortness of breath: Secondary | ICD-10-CM | POA: Diagnosis not present

## 2020-03-13 DIAGNOSIS — Z808 Family history of malignant neoplasm of other organs or systems: Secondary | ICD-10-CM | POA: Diagnosis not present

## 2020-03-13 DIAGNOSIS — I471 Supraventricular tachycardia: Secondary | ICD-10-CM | POA: Diagnosis not present

## 2020-03-13 DIAGNOSIS — E559 Vitamin D deficiency, unspecified: Secondary | ICD-10-CM | POA: Diagnosis not present

## 2020-03-13 DIAGNOSIS — R079 Chest pain, unspecified: Secondary | ICD-10-CM | POA: Insufficient documentation

## 2020-03-13 DIAGNOSIS — R197 Diarrhea, unspecified: Secondary | ICD-10-CM | POA: Insufficient documentation

## 2020-03-13 DIAGNOSIS — R7989 Other specified abnormal findings of blood chemistry: Secondary | ICD-10-CM | POA: Diagnosis present

## 2020-03-13 DIAGNOSIS — Q676 Pectus excavatum: Secondary | ICD-10-CM | POA: Insufficient documentation

## 2020-03-13 DIAGNOSIS — Z833 Family history of diabetes mellitus: Secondary | ICD-10-CM | POA: Diagnosis not present

## 2020-03-13 DIAGNOSIS — R002 Palpitations: Secondary | ICD-10-CM | POA: Diagnosis not present

## 2020-03-13 DIAGNOSIS — F419 Anxiety disorder, unspecified: Secondary | ICD-10-CM | POA: Diagnosis not present

## 2020-03-13 DIAGNOSIS — Z79899 Other long term (current) drug therapy: Secondary | ICD-10-CM | POA: Diagnosis not present

## 2020-03-13 DIAGNOSIS — Z8249 Family history of ischemic heart disease and other diseases of the circulatory system: Secondary | ICD-10-CM | POA: Diagnosis not present

## 2020-03-13 DIAGNOSIS — R42 Dizziness and giddiness: Secondary | ICD-10-CM | POA: Diagnosis not present

## 2020-03-13 DIAGNOSIS — R55 Syncope and collapse: Secondary | ICD-10-CM | POA: Insufficient documentation

## 2020-03-13 DIAGNOSIS — Z87891 Personal history of nicotine dependence: Secondary | ICD-10-CM | POA: Insufficient documentation

## 2020-03-13 MED ORDER — ALPRAZOLAM 0.5 MG PO TABS: ORAL_TABLET | ORAL | 0 refills | Status: DC

## 2020-03-13 NOTE — Telephone Encounter (Signed)
no to the covid qustions MR Brain w/wo contrast Dr. Su Ley Josem Kaufmann: [id number] (exp. 03/13/20 to 04/12/20). Patient is scheduled at [facility] for 03/20/20.   Patient also informed me she is claustrophobic and would like something to help her. She is aware to have a driver.

## 2020-03-13 NOTE — Addendum Note (Signed)
Addended by: Andrey Spearman R on: 03/13/2020 03:23 PM   Modules accepted: Orders

## 2020-03-13 NOTE — Telephone Encounter (Signed)
Meds ordered this encounter  Medications  . ALPRAZolam (XANAX) 0.5 MG tablet    Sig: for sedation before MRI scan; take 1 tab 1 hour before scan; may repeat 1 tab 15 min before scan    Dispense:  3 tablet    Refill:  0    Penni Bombard, MD 05/10/8030, 1:22 PM Certified in Neurology, Neurophysiology and Neuroimaging  [facility] [address] [place], [place] [address] [phone]

## 2020-03-19 ENCOUNTER — Encounter: Payer: Self-pay | Admitting: Oncology

## 2020-03-19 NOTE — Progress Notes (Signed)
Hematology/Oncology Consult note [facility] [phone] [phone]  Patient Care Team: Doreen Beam, FNP as PCP - General (Family Medicine)   Name of the patient: Valerie Gardner  [id number]  06-18-1990    Reason for referral-history of DVT and sister   Referring physician-Michelle Flinchum  Date of visit: 03/19/20   History of presenting illness-patient is a 30 year old female with a past medical history significant for SVT, chest pain of unclear etiology.  She is G3 P3 L3.  Her sister was admitted to the hospital for symptoms of Covid and was found to have a blood clot.  Patient states that the blood clot also went to her spine.  It is unclear if this was truly a DVT versus a septic embolus.  Patient has been having ongoing symptoms of lightheadedness and palpitations for which she sees cardiology and neurology.  Her primary care provider checked her D-dimer which was Elevated at 0.84 and therefore patient has been sent to [place].  She did have a recent bilateral lower extremity DVT study which was negative as well as CT angio chest which did not show any evidence of pulmonary embolus.  Patient was previously on birth control but had problems tolerating it and presently is not taking birth control.  Patient reports ongoing fatigue, intermittent chest pain and palpitations as well as tremors in her hands.  ECOG PS- 0  Pain scale- 0   Review of systems- Review of Systems  Constitutional: Negative for chills, fever, malaise/fatigue and weight loss.  HENT: Negative for congestion, ear discharge and nosebleeds.   Eyes: Negative for blurred vision.  Respiratory: Negative for cough, hemoptysis, sputum production, shortness of breath and wheezing.   Cardiovascular: Positive for chest pain and palpitations. Negative for orthopnea and claudication.  Gastrointestinal: Negative for abdominal pain, blood in stool, constipation, diarrhea,  heartburn, melena, nausea and vomiting.  Genitourinary: Negative for dysuria, flank pain, frequency, hematuria and urgency.  Musculoskeletal: Negative for back pain, joint pain and myalgias.  Skin: Negative for rash.  Neurological: Positive for dizziness. Negative for tingling, focal weakness, seizures, weakness and headaches.  Endo/Heme/Allergies: Does not bruise/bleed easily.  Psychiatric/Behavioral: Negative for depression and suicidal ideas. The patient does not have insomnia.     No Known Allergies  Patient Active Problem List   Diagnosis Date Noted  . SVT (supraventricular tachycardia) ([place]) 03/07/2020  . Shortness of breath 03/07/2020  . Oral contraceptive intolerance- was taking and discontinued.  03/07/2020  . Positive D dimer 03/07/2020  . Anxiety 03/06/2020  . Screening for blood or protein in urine 03/06/2020  . Vitamin D deficiency 03/06/2020  . Postural dizziness with near syncope 03/06/2020  . Family history of syncope 03/06/2020  . Fatigue 03/06/2020  . Family history of DVT 03/06/2020  . Chest pain 02/12/2020  . Diarrhea 02/12/2020  . Nausea 02/12/2020  . Shortness of breath 02/12/2020  . SVT (supraventricular tachycardia) ([place]) 02/12/2020  . Postpartum care following vaginal delivery 04/01/2018  . 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 [redacted] weeks gestation of pregnancy 04/02/2017  . Anxiety   . PCOS (polycystic ovarian syndrome)   . Supraventricular tachycardia [facility])      Past Surgical History:  Procedure Laterality Date  . NO PAST SURGERIES      Social History   Socioeconomic History  . Marital status: Married    Spouse name: [name]  . Number of children: 2  . Years of education: 41  .  Highest education level: Not on file  Occupational History  . Occupation: STAY AT HOME MOM  Tobacco Use  . Smoking status: Former Research scientist (life sciences)  . Smokeless tobacco: Never Used  Vaping Use  . Vaping Use: Never used  Substance and  Sexual Activity  . Alcohol use: No  . Drug use: No  . Sexual activity: Yes    Birth control/protection: None  Other Topics Concern  . Not on file  Social History Narrative   Lives with husband and 3 children   Right Handed   Drinks very rarely caffeine   Social Determinants of Health   Financial Resource Strain: Not on file  Food Insecurity: Not on file  Transportation Needs: Not on file  Physical Activity: Not on file  Stress: Not on file  Social Connections: Not on file  Intimate Partner Violence: Not on file     Family History  Problem Relation Age of Onset  . Diabetes Mellitus I Sister   . Melanoma Maternal Grandmother   . Diabetes Mellitus II Paternal Grandmother   . COPD Paternal Grandmother   . Diabetes Paternal Grandmother   . Hypertension Mother   . Hypertension Father   . Brain cancer Paternal Grandfather      Current Outpatient Medications:  .  butalbital-acetaminophen-caffeine (FIORICET) 50-325-40 MG tablet, Take 1 tablet by mouth every 6 (six) hours as needed for headache., Disp: 30 tablet, Rfl: 0 .  cyclobenzaprine (FLEXERIL) 10 MG tablet, Take 10 mg by mouth 3 (three) times daily as needed for muscle spasms., Disp: , Rfl:  .  metoprolol tartrate (LOPRESSOR) 25 MG tablet, Take 25 mg by mouth 2 (two) times daily., Disp: , Rfl:  .  ALPRAZolam (XANAX) 0.5 MG tablet, for sedation before MRI scan; take 1 tab 1 hour before scan; may repeat 1 tab 15 min before scan, Disp: 3 tablet, Rfl: 0 .  hydrOXYzine (ATARAX/VISTARIL) 25 MG tablet, Take 1-2 tablets (25-50 mg total) by mouth 3 (three) times daily. (Patient not taking: Reported on 03/13/2020), Disp: 30 tablet, Rfl: 0 .  ondansetron (ZOFRAN) 4 MG tablet, Take 1 tablet (4 mg total) by mouth every 8 (eight) hours as needed for nausea or vomiting. (Patient not taking: Reported on 03/13/2020), Disp: 20 tablet, Rfl: 0   Physical exam:  Vitals:   03/13/20 1056  BP: 111/69  Pulse: 72  Temp: 97.7 F (36.5 C)  TempSrc:  Tympanic  SpO2: 100%  Weight: 126 lb 14.4 oz (57.6 kg)   Physical Exam Constitutional:      General: She is not in acute distress. Eyes:     Extraocular Movements: EOM normal.  Cardiovascular:     Rate and Rhythm: Normal rate and regular rhythm.     Heart sounds: Normal heart sounds.  Pulmonary:     Effort: Pulmonary effort is normal.     Breath sounds: Normal breath sounds.  Abdominal:     General: Bowel sounds are normal.     Palpations: Abdomen is soft.  Lymphadenopathy:     Comments: No palpable cervical, supraclavicular, axillary or inguinal adenopathy   Skin:    General: Skin is warm and dry.  Neurological:     Mental Status: She is alert and oriented to person, place, and time.        CMP Latest Ref Rng & Units 03/06/2020  Glucose 65 - 99 mg/dL 94  BUN 6 - 20 mg/dL 6  Creatinine 0.57 - 1.00 mg/dL 0.70  Sodium 134 - 144 mmol/L 145(H)  Potassium 3.5 - 5.2 mmol/L 4.3  Chloride 96 - 106 mmol/L 105  CO2 20 - 29 mmol/L 24  Calcium 8.7 - 10.2 mg/dL 8.9  Total Protein 6.0 - 8.5 g/dL 6.7  Total Bilirubin 0.0 - 1.2 mg/dL <0.2  Alkaline Phos 44 - 121 IU/L 60  AST 0 - 40 IU/L 17  ALT 0 - 32 IU/L 21   CBC Latest Ref Rng & Units 03/06/2020  WBC 3.4 - 10.8 x10E3/uL 5.6  Hemoglobin 11.1 - 15.9 g/dL 12.9  Hematocrit 34.0 - 46.6 % 38.9  Platelets 150 - 450 x10E3/uL 218    No images are attached to the encounter.  CT Angio Chest W/Cm &/Or Wo Cm  Result Date: 03/07/2020 CLINICAL DATA:  Tachycardia. Elevated D-dimer. Family history DVT. On oral contraceptives. Shortness of breath. EXAM: CT ANGIOGRAPHY CHEST WITH CONTRAST TECHNIQUE: Multidetector CT imaging of the chest was performed using the standard protocol during bolus administration of intravenous contrast. Multiplanar CT image reconstructions and MIPs were obtained to evaluate the vascular anatomy. CONTRAST:  147mL OMNIPAQUE IOHEXOL 350 MG/ML SOLN COMPARISON:  None. FINDINGS: Cardiovascular: The quality of this exam for  evaluation of pulmonary embolism is good. No evidence of pulmonary embolism. Normal aortic caliber. Heart size accentuated by a mild pectus excavatum deformity. Mediastinum/Nodes: No mediastinal or hilar adenopathy. Residual thymic tissue in the anterior mediastinum. Lungs/Pleura: No pleural fluid.  Clear lungs. Upper Abdomen: Normal imaged portions of the liver, spleen, stomach, adrenal glands, pancreas. Musculoskeletal: No acute osseous abnormality. Review of the MIP images confirms the above findings. IMPRESSION: No pulmonary embolism. No acute findings or explanation for patient's history. Electronically Signed   By: Abigail Miyamoto M.D.   On: 03/07/2020 14:24   US Venous Img Lower Bilateral (DVT)  Result Date: 03/07/2020 CLINICAL DATA:  Shortness of breath with elevated D-dimer. EXAM: BILATERAL LOWER EXTREMITY VENOUS DOPPLER ULTRASOUND TECHNIQUE: Gray-scale sonography with compression, as well as color and duplex ultrasound, were performed to evaluate the deep venous system(s) from the level of the common femoral vein through the popliteal and proximal calf veins. COMPARISON:  None. FINDINGS: VENOUS Normal compressibility of the common femoral, superficial femoral, and popliteal veins, as well as the visualized calf veins. Visualized portions of profunda femoral vein and great saphenous vein unremarkable. No filling defects to suggest DVT on grayscale or color Doppler imaging. Doppler waveforms show normal direction of venous flow, normal respiratory plasticity and response to augmentation. OTHER None. Limitations: none IMPRESSION: Negative. Electronically Signed   By: Constance Holster M.D.   On: 03/07/2020 15:20    Assessment and plan- Patient is a 30 y.o. female referred for possible history of DVT in her sister and positive D-dimer  Explained to the patient had a positive D-dimer is nonspecific and can also be elevated in conditions other than thromboembolic phenomena.  Moreover patient had a recent  bilateral lower extremity Doppler as well as CT angio chest which did not reveal any evidence of DVT or PE.  It appears that her sister may have had a thrombotic episode post Covid but it is not entirely clear if this was also a septic embolus since patient states that her clot went to her spine.  Even if she had a true DVT/PE post Covid that would be a provoked event.    Regardless patient does not have any history of prior or present DVT or PE and therefore does not require any hypercoagulable testing in the absence of clear DVT/positive genetic history in her sister.  No other family history of DVT or PE.  Patient has ongoing symptoms of intermittent chest palpitations and dizziness for which she has been following up with cardiology and neurology.Unclear of the symptoms may represent POTS.  Defer further work-up to cardiology and neurology.  She has an upcoming MRI brain scheduled as well.  No further input from heme-onc perspective.  Thank you for this kind referral and the opportunity to participate in the care of this patient   Visit Diagnosis 1. Positive D-dimer     Dr. Randa Evens, MD, MPH [facility] [phone] 03/19/2020

## 2020-03-20 ENCOUNTER — Ambulatory Visit: Payer: Commercial Managed Care - PPO

## 2020-03-20 DIAGNOSIS — R42 Dizziness and giddiness: Secondary | ICD-10-CM

## 2020-03-20 MED ORDER — GADOBENATE DIMEGLUMINE 529 MG/ML IV SOLN
10.0000 mL | Freq: Once | INTRAVENOUS | Status: AC | PRN
  Administered 2020-03-20: 10 mL via INTRAVENOUS

## 2020-03-27 ENCOUNTER — Telehealth: Payer: Self-pay | Admitting: Diagnostic Neuroimaging

## 2020-03-27 NOTE — Telephone Encounter (Signed)
Pt is requesting MRI results.    Please advise.

## 2020-03-27 NOTE — Telephone Encounter (Signed)
Called patient and informed her MRI brain was normal. She asked if she needs FU. I advised only if symptoms worsen. Otherwise, advised she follow up with cardiology and PCP. Patient verbalized understanding, appreciation.

## 2020-03-28 ENCOUNTER — Encounter: Payer: Self-pay | Admitting: Adult Health

## 2020-03-28 ENCOUNTER — Ambulatory Visit
Admission: RE | Admit: 2020-03-28 | Discharge: 2020-03-28 | Disposition: A | Discharge status: Home / Self Care | Payer: Commercial Managed Care - PPO | Attending: Adult Health | Admitting: Adult Health

## 2020-03-28 ENCOUNTER — Other Ambulatory Visit: Payer: Self-pay

## 2020-03-28 ENCOUNTER — Ambulatory Visit
Admission: RE | Admit: 2020-03-28 | Discharge: 2020-03-28 | Disposition: A | Discharge status: Home / Self Care | Payer: Commercial Managed Care - PPO | Source: Ambulatory Visit | Attending: Adult Health | Admitting: Adult Health

## 2020-03-28 ENCOUNTER — Ambulatory Visit: Payer: Commercial Managed Care - PPO | Admitting: Adult Health

## 2020-03-28 VITALS — BP 101/84 | HR 81 | Temp 97.8°F | Ht 66.0 in | Wt 127.0 lb

## 2020-03-28 DIAGNOSIS — M2550 Pain in unspecified joint: Secondary | ICD-10-CM | POA: Insufficient documentation

## 2020-03-28 DIAGNOSIS — G8929 Other chronic pain: Secondary | ICD-10-CM

## 2020-03-28 DIAGNOSIS — R55 Syncope and collapse: Secondary | ICD-10-CM

## 2020-03-28 DIAGNOSIS — M25511 Pain in right shoulder: Secondary | ICD-10-CM | POA: Insufficient documentation

## 2020-03-28 DIAGNOSIS — I7300 Raynaud's syndrome without gangrene: Secondary | ICD-10-CM | POA: Insufficient documentation

## 2020-03-28 DIAGNOSIS — M545 Low back pain, unspecified: Secondary | ICD-10-CM | POA: Diagnosis not present

## 2020-03-28 DIAGNOSIS — I471 Supraventricular tachycardia: Secondary | ICD-10-CM

## 2020-03-28 DIAGNOSIS — M542 Cervicalgia: Secondary | ICD-10-CM | POA: Diagnosis present

## 2020-03-28 DIAGNOSIS — M62838 Other muscle spasm: Secondary | ICD-10-CM

## 2020-03-28 DIAGNOSIS — R42 Dizziness and giddiness: Secondary | ICD-10-CM

## 2020-03-28 MED ORDER — METOPROLOL SUCCINATE ER 25 MG PO TB24: 12.5000 mg | ORAL_TABLET | Freq: Every day | ORAL | 0 refills | Status: DC

## 2020-03-28 MED ORDER — CYCLOBENZAPRINE HCL 10 MG PO TABS: 10.0000 mg | ORAL_TABLET | Freq: Every day | ORAL | 0 refills | Status: DC

## 2020-03-28 NOTE — Patient Instructions (Signed)
Chronic Back Pain When back pain lasts longer than 3 months, it is called chronic back pain. Pain may get worse at certain times (flare-ups). There are things you can do at home to manage your pain. Follow these instructions at home: Pay attention to any changes in your symptoms. Take these actions to help with your pain: Managing pain and stiffness  If told, put ice on the painful area. Your doctor may tell you to use ice for 24-48 hours after the flare-up starts. To do this: ? Put ice in a plastic bag. ? Place a towel between your skin and the bag. ? Leave the ice on for 20 minutes, 2-3 times a day.  If told, put heat on the painful area. Do this as often as told by your doctor. Use the heat source that your doctor recommends, such as a moist heat pack or a heating pad. ? Place a towel between your skin and the heat source. ? Leave the heat on for 20-30 minutes. ? Take off the heat if your skin turns bright red. This is especially important if you are unable to feel pain, heat, or cold. You may have a greater risk of getting burned.  Soak in a warm bath. This can help relieve pain.      Activity  Avoid bending and other activities that make pain worse.  When standing: ? Keep your upper back and neck straight. ? Keep your shoulders pulled back. ? Avoid slouching.  When sitting: ? Keep your back straight. ? Relax your shoulders. Do not round your shoulders or pull them backward.  Do not sit or stand in one place for long periods of time.  Take short rest breaks during the day. Lying down or standing is usually better than sitting. Resting can help relieve pain.  When sitting or lying down for a long time, do some mild activity or stretching. This will help to prevent stiffness and pain.  Get regular exercise. Ask your doctor what activities are safe for you.  Do not lift anything that is heavier than 10 lb (4.5 kg) or the limit that you are told, until your doctor  says that it is safe.  To prevent injury when you lift things: ? Bend your knees. ? Keep the weight close to your body. ? Avoid twisting.  Sleep on a firm mattress. Try lying on your side with your knees slightly bent. If you lie on your back, put a pillow under your knees.   Medicines  Treatment may include medicines for pain and swelling taken by mouth or put on the skin, prescription pain medicine, or muscle relaxants.  Take over-the-counter and prescription medicines only as told by your doctor.  Ask your doctor if the medicine prescribed to you: ? Requires you to avoid driving or using machinery. ? Can cause trouble pooping (constipation). You may need to take these actions to prevent or treat trouble pooping:  Drink enough fluid to keep your pee (urine) pale yellow.  Take over-the-counter or prescription medicines.  Eat foods that are high in fiber. These include beans, whole grains, and fresh fruits and vegetables.  Limit foods that are high in fat and sugars. These include fried or sweet foods. General instructions  Do not use any products that contain nicotine or tobacco, such as cigarettes, e-cigarettes, and chewing tobacco. If you need help quitting, ask your doctor.  Keep all follow-up visits as told by your doctor. This is important.  Contact a doctor if:  Your pain does not get better with rest or medicine.  Your pain gets worse, or you have new pain.  You have a high fever.  You lose weight very quickly.  You have trouble doing your normal activities. Get help right away if:  One or both of your legs or feet feel weak.  One or both of your legs or feet lose feeling (have numbness).  You have trouble controlling when you poop (have a bowel movement) or pee (urinate).  You have bad back pain and: ? You feel like you may vomit (nauseous), or you vomit. ? You have pain in your belly (abdomen). ? You have shortness of breath. ? You faint. Summary  When  back pain lasts longer than 3 months, it is called chronic back pain.  Pain may get worse at certain times (flare-ups).  Use ice and heat as told by your doctor. Your doctor may tell you to use ice after flare-ups. This information is not intended to replace advice given to you by your health care provider. Make sure you discuss any questions you have with your health care provider. Document Revised: 03/02/2019 Document Reviewed: 03/02/2019 Elsevier Patient Education  2021 [place]  Raynaud phenomenon is a condition that affects the blood vessels (arteries) that carry blood to your fingers and toes. The arteries that supply blood to your ears, lips, nipples, or the tip of your nose might also be affected. Raynaud phenomenon causes the arteries to become narrow temporarily (spasm). As a result, the flow of blood to the affected areas is temporarily decreased. This usually occurs in response to cold temperatures or stress. During an attack, the skin in the affected areas turns white, then blue, and finally red. You may also feel tingling or numbness in those areas. Attacks usually last for only a brief period, and then the blood flow to the area returns to normal. In most cases, Raynaud phenomenon does not cause serious health problems. What are the causes? In many cases, the cause of this condition is not known. The condition may occur on its own (primary Raynaud phenomenon) or may be associated with other diseases or factors (secondary Raynaud phenomenon). Possible causes may include:  Diseases or medical conditions that damage the arteries.  Injuries and repetitive actions that hurt the hands or feet.  Being exposed to certain chemicals.  Taking medicines that narrow the arteries.  Other medical conditions, such as lupus, scleroderma, rheumatoid arthritis, thyroid problems, blood disorders, Sjogren syndrome, or atherosclerosis. What increases the risk? The  following factors may make you more likely to develop this condition:  Being 74-83 years old.  Being female.  Having a family history of Raynaud phenomenon.  Living in a cold climate.  Smoking. What are the signs or symptoms? Symptoms of this condition usually occur when you are exposed to cold temperatures or when you have emotional stress. The symptoms may last for a few minutes or up to several hours. They usually affect your fingers but may also affect your toes, nipples, lips, ears, or the tip of your nose. Symptoms may include:  Changes in skin color. The skin in the affected areas will turn pale or white. The skin may then change from white to bluish to red as normal blood flow returns to the area.  Numbness, tingling, or pain in the affected areas. In severe cases, symptoms may include:  Skin sores.  Tissues decaying and dying (gangrene). How is this  diagnosed? This condition may be diagnosed based on:  Your symptoms and medical history.  A physical exam. During the exam, you may be asked to put your hands in cold water to check for a reaction to cold temperature.  Tests, such as: ? Blood tests to check for other diseases or conditions. ? A test to check the movement of blood through your arteries and veins (vascular ultrasound). ? A test in which the skin at the base of your fingernail is examined under a microscope (nailfold capillaroscopy). How is this treated? Treatment for this condition often involves making lifestyle changes and taking steps to control your exposure to cold temperatures. For more severe cases, medicine (calcium channel blockers) may be used to improve blood flow. Surgery is sometimes done to block the nerves that control the affected arteries, but this is rare. Follow these instructions at home: Avoiding cold temperatures Take these steps to avoid exposure to cold:  If possible, stay indoors during cold weather.  When you go outside during cold  weather, dress in layers and wear mittens, a hat, a scarf, and warm footwear.  Wear mittens or gloves when handling ice or frozen food.  Use holders for glasses or cans containing cold drinks.  Let warm water run for a while before taking a shower or bath.  Warm up the car before driving in cold weather. Lifestyle  If possible, avoid stressful and emotional situations. Try to find ways to manage your stress, such as: ? Exercise. ? Yoga. ? Meditation. ? Biofeedback.  Do not use any products that contain nicotine or tobacco, such as cigarettes and e-cigarettes. If you need help quitting, ask your health care provider.  Avoid secondhand smoke.  Limit your use of caffeine. ? Switch to decaffeinated coffee, tea, and soda. ? Avoid chocolate.  Avoid vibrating tools and machinery. General instructions  Protect your hands and feet from injuries, cuts, or bruises.  Avoid wearing tight rings or wristbands.  Wear loose fitting socks and comfortable, roomy shoes.  Take over-the-counter and prescription medicines only as told by your health care provider. Contact a health care provider if:  Your discomfort becomes worse despite lifestyle changes.  You develop sores on your fingers or toes that do not heal.  Your fingers or toes turn black.  You have breaks in the skin on your fingers or toes.  You have a fever.  You have pain or swelling in your joints.  You have a rash.  Your symptoms occur on only one side of your body. Summary  Raynaud phenomenon is a condition that affects the arteries that carry blood to your fingers, toes, ears, lips, nipples, or the tip of your nose.  In many cases, the cause of this condition is not known.  Symptoms of this condition include changes in skin color, and numbness and tingling of the affected area.  Treatment for this condition includes lifestyle changes, reducing exposure to cold temperatures, and using medicines for severe cases of  the condition.  Contact your health care provider if your condition worsens despite treatment. This information is not intended to replace advice given to you by your health care provider. Make sure you discuss any questions you have with your health care provider. Document Revised: 06/02/2019 Document Reviewed: 06/02/2019 Elsevier Patient Education  [place].

## 2020-03-28 NOTE — Progress Notes (Signed)
Established patient visit   Patient: Valerie Gardner   DOB: 1990-08-27   30 y.o. Female  MRN: [id number] Visit Date: 03/28/2020  Today's healthcare provider: Marcille Buffy, FNP   Chief Complaint  Patient presents with  . Anxiety   Subjective    HPI  Anxiety, Follow-up  She was last seen for anxiety 3 weeks ago. Changes made at last visit include hydrOXYzine (ATARAX/VISTARIL) 25 MG tablet, escitalopram (LEXAPRO) 10 MG tablet.    She reports fair compliance with treatment.She stopped the Lexapro after 2 days.  She reports poor tolerance of treatment. She is having side effects. Pt says lexapro is causing dizziness, making her feel sick on stomach and she stopped two days after starting.  She feels her anxiety is mild and Improved since last visit.  She has had 2 years of left lower side back pain. Denies any paresthesias.  Denies any urinary symptoms.  Denies any loss of bowel or bladder control.  Denies saddle paresthesias.  Denies radiculopathy/ paresthesias.    Atarax helps some with anxiety.  Seeing cardiologist in 2 days. She shows me pictures of her hands " bluish" color after getting out of the shower.   She has seen neurologist. MRI negative.   Denies any syncope.   Neck has been sore and tender feeling posterior. Denies injury of heavy lifting.   Patient  denies any fever, body aches,chills, rash, chest pain, shortness of breath, nausea, vomiting, or diarrhea.  Denies dizziness, lightheadedness, pre syncopal or syncopal episodes.     No LMP recorded.  03/22/20 is LMP - regular.   Symptoms: No chest pain Yes difficulty concentrating  Yes dizziness No fatigue  No feelings of losing control No insomnia  Yes irritable Yes palpitations  No panic attacks No racing thoughts  No shortness of breath Yes sweating - night sweats  No tremors/shakes    GAD-7 Results No flowsheet data found.  PHQ-9 Scores PHQ9 SCORE ONLY 03/06/2020 03/06/2020   PHQ-9 Total Score 9 0    ---------------------------------------------------------------------------------------------------     Past Medical History:  Diagnosis Date  . [redacted] weeks gestation of pregnancy 04/02/2017  . Anxiety   . PCOS (polycystic ovarian syndrome)   . Supraventricular tachycardia [facility])    Past Surgical History:  Procedure Laterality Date  . NO PAST SURGERIES     Social History   Tobacco Use  . Smoking status: Former Research scientist (life sciences)  . Smokeless tobacco: Never Used  Vaping Use  . Vaping Use: Never used  Substance Use Topics  . Alcohol use: No  . Drug use: No   Social History   Socioeconomic History  . Marital status: Married    Spouse name: [name]  . Number of children: 2  . Years of education: 14  . Highest education level: Not on file  Occupational History  . Occupation: STAY AT HOME MOM  Tobacco Use  . Smoking status: Former Research scientist (life sciences)  . Smokeless tobacco: Never Used  Vaping Use  . Vaping Use: Never used  Substance and Sexual Activity  . Alcohol use: No  . Drug use: No  . Sexual activity: Yes    Birth control/protection: None  Other Topics Concern  . Not on file  Social History Narrative   Lives with husband and 3 children   Right Handed   Drinks very rarely caffeine   Social Determinants of Health   Financial Resource Strain: Not on file  Food Insecurity: Not on file  Transportation Needs: Not  on file  Physical Activity: Not on file  Stress: Not on file  Social Connections: Not on file  Intimate Partner Violence: Not on file   Family Status  Relation Name Status  . Sister  (Not Specified)  . MGM  (Not Specified)  . PGM  (Not Specified)  . Mother  Alive  . Father  Alive  . PGF  (Not Specified)   Family History  Problem Relation Age of Onset  . Diabetes Mellitus I Sister   . Melanoma Maternal Grandmother   . Diabetes Mellitus II Paternal Grandmother   . COPD Paternal Grandmother   . Diabetes Paternal Grandmother   . Hypertension  Mother   . Hypertension Father   . Brain cancer Paternal Grandfather    No Known Allergies     Medications: Outpatient Medications Prior to Visit  Medication Sig  . butalbital-acetaminophen-caffeine (FIORICET) 50-325-40 MG tablet Take 1 tablet by mouth every 6 (six) hours as needed for headache.  . ondansetron (ZOFRAN) 4 MG tablet Take 1 tablet (4 mg total) by mouth every 8 (eight) hours as needed for nausea or vomiting.  . [DISCONTINUED] ALPRAZolam (XANAX) 0.5 MG tablet for sedation before MRI scan; take 1 tab 1 hour before scan; may repeat 1 tab 15 min before scan  . [DISCONTINUED] cyclobenzaprine (FLEXERIL) 10 MG tablet Take 10 mg by mouth 3 (three) times daily as needed for muscle spasms.  . [DISCONTINUED] metoprolol tartrate (LOPRESSOR) 25 MG tablet Take 25 mg by mouth 2 (two) times daily.  . hydrOXYzine (ATARAX/VISTARIL) 25 MG tablet Take 1-2 tablets (25-50 mg total) by mouth 3 (three) times daily. (Patient not taking: No sig reported)   No facility-administered medications prior to visit.    Review of Systems  Last CBC Lab Results  Component Value Date   WBC 5.6 03/06/2020   HGB 12.9 03/06/2020   HCT 38.9 03/06/2020   MCV 91 03/06/2020   MCH 30.1 03/06/2020   RDW 11.5 (L) 03/06/2020   PLT 218 79/89/2119   Last metabolic panel Lab Results  Component Value Date   GLUCOSE 94 03/06/2020   NA 145 (H) 03/06/2020   K 4.3 03/06/2020   CL 105 03/06/2020   CO2 24 03/06/2020   BUN 6 03/06/2020   CREATININE 0.70 03/06/2020   GFRNONAA 117 03/06/2020   GFRAA 135 03/06/2020   CALCIUM 8.9 03/06/2020   PROT 6.7 03/06/2020   ALBUMIN 4.6 03/06/2020   LABGLOB 2.1 03/06/2020   AGRATIO 2.2 03/06/2020   BILITOT <0.2 03/06/2020   ALKPHOS 60 03/06/2020   AST 17 03/06/2020   ALT 21 03/06/2020   ANIONGAP 10 03/13/2018   Last lipids No results found for: CHOL, HDL, LDLCALC, LDLDIRECT, TRIG, CHOLHDL Last hemoglobin A1c Lab Results  Component Value Date   HGBA1C 5.0 03/06/2020    Last thyroid functions Lab Results  Component Value Date   TSH 3.450 03/06/2020   Last vitamin D No results found for: 25OHVITD2, 25OHVITD3, VD25OH Last vitamin B12 and Folate Lab Results  Component Value Date   VITAMINB12 534 03/06/2020       Objective    BP 101/84 (BP Location: Right Arm, Patient Position: Sitting, Cuff Size: Small)   Pulse 81   Temp 97.8 F (36.6 C) (Oral)   Ht 5\' 6"  (1.676 m)   Wt 127 lb (57.6 kg)   SpO2 100%   BMI 20.50 kg/m  BP Readings from Last 3 Encounters:  03/28/20 101/84  03/13/20 111/69  03/09/20 119/83   Wt  Readings from Last 3 Encounters:  03/28/20 127 lb (57.6 kg)  03/13/20 126 lb 14.4 oz (57.6 kg)  03/09/20 126 lb (57.2 kg)       Physical Exam Vitals reviewed.  Constitutional:      General: She is not in acute distress.    Appearance: She is well-developed. She is not diaphoretic.     Interventions: She is not intubated. HENT:     Head: Normocephalic and atraumatic.     Right Ear: External ear normal.     Left Ear: External ear normal.     Nose: Nose normal.     Mouth/Throat:     Pharynx: No oropharyngeal exudate.  Eyes:     General: Lids are normal. No scleral icterus.       Right eye: No discharge.        Left eye: No discharge.     Conjunctiva/sclera: Conjunctivae normal.     Right eye: Right conjunctiva is not injected. No exudate or hemorrhage.    Left eye: Left conjunctiva is not injected. No exudate or hemorrhage.    Pupils: Pupils are equal, round, and reactive to light.  Neck:     Thyroid: No thyroid mass or thyromegaly.     Vascular: Normal carotid pulses. No carotid bruit, hepatojugular reflux or JVD.     Trachea: Trachea and phonation normal. No tracheal tenderness or tracheal deviation.     Meningeal: Brudzinski's sign and Kernig's sign absent.  Cardiovascular:     Rate and Rhythm: Normal rate and regular rhythm.     Pulses: Normal pulses.          Radial pulses are 2+ on the right side and 2+ on the  left side.       Dorsalis pedis pulses are 2+ on the right side and 2+ on the left side.       Posterior tibial pulses are 2+ on the right side and 2+ on the left side.     Heart sounds: Normal heart sounds, S1 normal and S2 normal. Heart sounds not distant. No murmur heard. No friction rub. No gallop.   Pulmonary:     Effort: Pulmonary effort is normal. No tachypnea, bradypnea, accessory muscle usage or respiratory distress. She is not intubated.     Breath sounds: Normal breath sounds. No stridor. No wheezing or rales.  Chest:     Chest wall: No tenderness.  Breasts:     Right: No supraclavicular adenopathy.     Left: No supraclavicular adenopathy.    Abdominal:     General: Bowel sounds are normal. There is no distension or abdominal bruit.     Palpations: Abdomen is soft. There is no shifting dullness, fluid wave, hepatomegaly, splenomegaly, mass or pulsatile mass.     Tenderness: There is no abdominal tenderness. There is no guarding or rebound.     Hernia: No hernia is present.  Musculoskeletal:        General: No deformity. Normal range of motion.     Right shoulder: Tenderness present. Normal strength. Normal pulse.     Left shoulder: Normal. Normal pulse.     Cervical back: Full passive range of motion without pain, normal range of motion and neck supple. Spasms and tenderness present. No edema, erythema or rigidity. No spinous process tenderness or muscular tenderness. Normal range of motion.     Thoracic back: Tenderness present.     Lumbar back: Spasms and tenderness present.  Lymphadenopathy:     Head:  Right side of head: No submental, submandibular, tonsillar, preauricular, posterior auricular or occipital adenopathy.     Left side of head: No submental, submandibular, tonsillar, preauricular, posterior auricular or occipital adenopathy.     Cervical: No cervical adenopathy.     Right cervical: No superficial, deep or posterior cervical adenopathy.    Left cervical:  No superficial, deep or posterior cervical adenopathy.     Upper Body:     Right upper body: No supraclavicular or pectoral adenopathy.     Left upper body: No supraclavicular or pectoral adenopathy.  Skin:    General: Skin is warm and dry.     Coloration: Skin is not pale.     Findings: No abrasion, bruising, burn, ecchymosis, erythema, lesion, petechiae or rash.     Nails: There is no clubbing.  Neurological:     General: No focal deficit present.     Mental Status: She is alert and oriented to person, place, and time.     GCS: GCS eye subscore is 4. GCS verbal subscore is 5. GCS motor subscore is 6.     Cranial Nerves: Cranial nerves are intact. No cranial nerve deficit.     Sensory: Sensation is intact. No sensory deficit.     Motor: Motor function is intact. No tremor, atrophy, abnormal muscle tone or seizure activity.     Coordination: Coordination is intact. Coordination normal.     Gait: Gait is intact. Gait normal.     Deep Tendon Reflexes: Reflexes are normal and symmetric. Reflexes normal. Babinski sign absent on the right side. Babinski sign absent on the left side.     Reflex Scores:      Tricep reflexes are 2+ on the right side and 2+ on the left side.      Bicep reflexes are 2+ on the right side and 2+ on the left side.      Brachioradialis reflexes are 2+ on the right side and 2+ on the left side.      Patellar reflexes are 2+ on the right side and 2+ on the left side.      Achilles reflexes are 2+ on the right side and 2+ on the left side. Psychiatric:        Speech: Speech normal.        Behavior: Behavior normal.        Thought Content: Thought content normal.        Judgment: Judgment normal.      No results found for any visits on 03/28/20.  Assessment & Plan     1. Chronic left-sided low back pain without sciatica - DG Lumbar Spine Complete   2. Arthralgia, unspecified joint - DG Lumbar Spine Complete - DG Cervical Spine Complete - Lyme Ab/Western  Blot Reflex - Rheumatoid factor - ANA  3. Neck pain, acute  - DG Cervical Spine Complete   4. Acute pain of right shoulder  - DG Shoulder Right  5. Raynaud's phenomenon without gangrene Suspected, discussed etiology and symptoms.   6. Postural dizziness with near syncope No syncopal episodes since last visit. Has had lower heart rate and low blood pressure and fatigue.   7. SVT (supraventricular tachycardia) (HCC) Will decrease Metoprolol and start XL- start with half tablet, sees cardiology in 2 days.  Meds ordered this encounter  Medications  . metoprolol succinate (TOPROL-XL) 25 MG 24 hr tablet    Sig: Take 0.5 tablets (12.5 mg total) by mouth daily.    Dispense:  90 tablet    Refill:  0  . cyclobenzaprine (FLEXERIL) 10 MG tablet    Sig: Take 1 tablet (10 mg total) by mouth at bedtime.    Dispense:  30 tablet    Refill:  0    8. Cervical paraspinous muscle spasm  - cyclobenzaprine (FLEXERIL) 10 MG tablet; Take 1 tablet (10 mg total) by mouth at bedtime.  Dispense: 30 tablet; Refill: 0   9. Anxiety    She prefers to continue Atarax as needed. Offered to retry Lexapro as she already had dizziness and nausea prior to starting it. Offered other antidepressant she declines at this time. She will se Return in about 2 weeks (around 04/11/2020), or if symptoms worsen or fail to improve, for at any time for any worsening symptoms, Go to Emergency room/ urgent care if worse.      Red Flags discussed. The patient was given clear instructions to go to ER or return to medical center if any red flags develop, symptoms do not improve, worsen or new problems develop. They verbalized understanding.   The entirety of the information documented in the History of Present Illness, Review of Systems and Physical Exam were personally obtained by me. Portions of this information were initially documented by the CMA and reviewed by me for thoroughness and accuracy.      Marcille Buffy, [place] [phone] (phone) [phone] (fax)  [place]

## 2020-03-29 LAB — LYME AB/WESTERN BLOT REFLEX
LYME DISEASE AB, QUANT, IGM: <0.8 index (ref 0.00–0.79)
Lyme IgG/IgM Ab: <0.91 {ISR} (ref 0.00–0.90)

## 2020-03-29 LAB — ANA: Anti Nuclear Antibody (ANA): NEGATIVE

## 2020-03-29 LAB — RHEUMATOID FACTOR: Rheumatoid fact SerPl-aCnc: <10 IU/mL (ref <14.0)

## 2020-03-29 NOTE — Progress Notes (Signed)
Slight scoliosis of thoracic spine. If pain persists with trying muscle relaxer as prescribed  she can see orthopedic but usually no significant treatment with this slight curve.

## 2020-03-29 NOTE — Progress Notes (Signed)
Right shoulder is within normal limits x ray.

## 2020-03-29 NOTE — Progress Notes (Signed)
Lyme titers are negative.  ANA negative.  Rheumatoid Factor is negative.

## 2020-03-29 NOTE — Progress Notes (Signed)
Negative cervical/ neck x ray does show reversal of normal spine curve which is usually caused by muscle spasm.

## 2020-03-29 NOTE — Progress Notes (Signed)
Lumbar lower  back x ray within normal limits.

## 2020-04-02 ENCOUNTER — Other Ambulatory Visit: Payer: Self-pay | Admitting: Adult Health

## 2020-04-02 DIAGNOSIS — R110 Nausea: Secondary | ICD-10-CM

## 2020-04-03 MED ORDER — BUTALBITAL-APAP-CAFFEINE 50-325-40 MG PO TABS: 1.0000 | ORAL_TABLET | Freq: Two times a day (BID) | ORAL | 0 refills | Status: DC | PRN

## 2020-04-03 MED ORDER — ONDANSETRON HCL 4 MG PO TABS
4.0000 mg | ORAL_TABLET | Freq: Three times a day (TID) | ORAL | 0 refills | Status: DC | PRN
Start: 2020-04-03 — End: 2020-09-24

## 2020-04-12 ENCOUNTER — Encounter: Payer: Self-pay | Admitting: Adult Health

## 2020-04-13 ENCOUNTER — Ambulatory Visit: Payer: Self-pay | Admitting: Adult Health

## 2020-04-13 ENCOUNTER — Other Ambulatory Visit: Payer: Self-pay | Admitting: Adult Health

## 2020-04-13 DIAGNOSIS — F419 Anxiety disorder, unspecified: Secondary | ICD-10-CM

## 2020-04-13 MED ORDER — HYDROXYZINE HCL 25 MG PO TABS: 25.0000 mg | ORAL_TABLET | Freq: Three times a day (TID) | ORAL | 0 refills | Status: DC

## 2020-04-13 NOTE — Telephone Encounter (Signed)
Pt called back  There were no appt left for the day.  She wants to know if Sharyn Lull will send an antibiotic to the pharmacy since she already has an appt next week.  [facility]  CB# [phone]

## 2020-04-14 ENCOUNTER — Other Ambulatory Visit: Payer: Self-pay | Admitting: Adult Health

## 2020-04-14 DIAGNOSIS — R399 Unspecified symptoms and signs involving the genitourinary system: Secondary | ICD-10-CM

## 2020-04-14 MED ORDER — CEPHALEXIN 500 MG PO CAPS: 500.0000 mg | ORAL_CAPSULE | Freq: Three times a day (TID) | ORAL | 0 refills | Status: DC

## 2020-04-14 NOTE — Progress Notes (Signed)
Meds ordered this encounter  Medications  . cephALEXin (KEFLEX) 500 MG capsule    Sig: Take 1 capsule (500 mg total) by mouth 3 (three) times daily.    Dispense:  15 capsule    Refill:  0

## 2020-04-20 ENCOUNTER — Ambulatory Visit: Payer: Self-pay | Admitting: Adult Health

## 2020-04-26 ENCOUNTER — Ambulatory Visit: Payer: Commercial Managed Care - PPO | Admitting: Internal Medicine

## 2020-04-26 ENCOUNTER — Encounter: Payer: Self-pay | Admitting: Internal Medicine

## 2020-04-26 ENCOUNTER — Other Ambulatory Visit: Payer: Self-pay

## 2020-04-26 VITALS — BP 110/75 | HR 65 | Ht 66.0 in | Wt 133.0 lb

## 2020-04-26 DIAGNOSIS — I471 Supraventricular tachycardia: Secondary | ICD-10-CM | POA: Diagnosis not present

## 2020-04-26 DIAGNOSIS — R42 Dizziness and giddiness: Secondary | ICD-10-CM | POA: Diagnosis not present

## 2020-04-26 DIAGNOSIS — R55 Syncope and collapse: Secondary | ICD-10-CM | POA: Diagnosis not present

## 2020-04-26 NOTE — Patient Instructions (Signed)
Medication Instructions:  - Your physician recommends that you continue on your current medications as directed. Please refer to the Current Medication list given to you today.  *If you need a refill on your cardiac medications before your next appointment, please call your pharmacy*   Lab Work: - none ordered  If you have labs (blood work) drawn today and your tests are completely normal, you will receive your results only by: Marland Kitchen MyChart Message (if you have MyChart) OR . A paper copy in the mail If you have any lab test that is abnormal or we need to change your treatment, we will call you to review the results.   Testing/Procedures: - none ordered   Follow-Up: At [facility], you and your health needs are our priority.  As part of our continuing mission to provide you with exceptional heart care, we have created designated Provider Care Teams.  These Care Teams include your primary Cardiologist (physician) and Advanced Practice Providers (APPs -  Physician Assistants and Nurse Practitioners) who all work together to provide you with the care you need, when you need it.  We recommend signing up for the patient portal called "MyChart".  Sign up information is provided on this After Visit Summary.  MyChart is used to connect with patients for Virtual Visits (Telemedicine).  Patients are able to view lab/test results, encounter notes, upcoming appointments, etc.  Non-urgent messages can be sent to your provider as well.   To learn more about what you can do with MyChart, go to [email or web address].    Your next appointment:   3-4  month(s)  The format for your next appointment:   In Person  Provider:   Virl Axe, MD   Other Instructions n/a

## 2020-04-26 NOTE — Progress Notes (Signed)
ELECTROPHYSIOLOGY  OFFICE NOTE  Patient ID: Valerie Gardner, MRN: [id number], DOB/AGE: May 24, 1990 30 y.o. Admit date: (Not on file) Date of Consult: 04/26/2020  Primary Physician: Doreen Beam, FNP Primary Cardiologist: new prev at [place] is a 30 y.o. female who is being seen today for the evaluation of spells   HPI Valerie Gardner is a 30 y.o. female  Referring her self for evaluation of dizziness  History of spells of abrupt onset lightheadedness with problems with nausea and vision typically lasting minutes and unassociated with palpitations.  Never been witnessed by the patient's mother.  No comments regarding pallor.  No associated diaphoresis  12/21 had to significant episodes of "dizziness "that prompted ER evaluation.  They were associated with nausea headaches some chest pain and numbness of her arms and her legs.  She was so "dizzy "that she could not get off the bed.  Sound like there may have been a vertiginous component.  Ended up in the ER as noted twice on the first cardiac evaluation was on revealing's; on the second she had ", patient with multiple episodes of tachycardia to the 140s. Rhythm strip with narrow complex tachycardia and retrograde P waves, likely AVNRT" she was started on metoprolol 25 twice daily   Seen by [facility] cardiology with a diagnosis of SVT;  Tilt table testing was discussed but was not ultimately recommended.  Also discussed the role of anxiety and depression  Dizziness is characterized by duration of 30 minutes--4 to 6 hrs; aggravated on occasion by lying down turning the head looking down.*   When she is dizzy, exertion can make it worse associated with dyspnea and palpitations.  Dyspnea relieved by sitting but the dizziness not  Shower intolerance associate with venous discoloration.  Lightheadedness and presyncope.  Also arm numbness.  Also has numbness in her lower extremities when she tries to get into  her car.  She saw neuro 2/22  diagnosis was "dizziness "MRI was undertaken to rule out demyelinating disease and this was negative     Metoprolol and salt/water repletion has been without significant benefit  DATE TEST EF   1/22 Echo  55%   2/22 CTA  Neg for PE   Date Cr K Hgb  2/22 0.7 4.3 12.9         Also hyperandrogenism, followed by endocrine.  Note-"ovaries do not appear polycystic "from 5/16 still, Dr. Joycie Peek note-"most likely due to PCOS."  Past Medical History:  Diagnosis Date  . [redacted] weeks gestation of pregnancy 04/02/2017  . Anxiety   . PCOS (polycystic ovarian syndrome)   . Supraventricular tachycardia [facility])       Surgical History:  Past Surgical History:  Procedure Laterality Date  . NO PAST SURGERIES       Home Meds: Current Meds  Medication Sig  . butalbital-acetaminophen-caffeine (FIORICET) 50-325-40 MG tablet Take 1 tablet by mouth 2 (two) times daily as needed for headache.  . hydrOXYzine (ATARAX/VISTARIL) 25 MG tablet Take 1-2 tablets (25-50 mg total) by mouth 3 (three) times daily.  . metoprolol succinate (TOPROL-XL) 25 MG 24 hr tablet Take half tablet (12.5mg ) by mouth twice a day  . ondansetron (ZOFRAN) 4 MG tablet Take 1 tablet (4 mg total) by mouth every 8 (eight) hours as needed for nausea or vomiting.    Allergies: No Known Allergies  Social History   Socioeconomic History  . Marital status: Married    Spouse name:  [name]  . Number of children: 2  . Years of education: 67  . Highest education level: Not on file  Occupational History  . Occupation: STAY AT HOME MOM  Tobacco Use  . Smoking status: Former Research scientist (life sciences)  . Smokeless tobacco: Never Used  Vaping Use  . Vaping Use: Never used  Substance and Sexual Activity  . Alcohol use: No  . Drug use: No  . Sexual activity: Yes    Birth control/protection: None  Other Topics Concern  . Not on file  Social History Narrative   Lives with husband and 3 children   Right Handed   Drinks very  rarely caffeine   Social Determinants of Health   Financial Resource Strain: Not on file  Food Insecurity: Not on file  Transportation Needs: Not on file  Physical Activity: Not on file  Stress: Not on file  Social Connections: Not on file  Intimate Partner Violence: Not on file     Family History  Problem Relation Age of Onset  . Diabetes Mellitus I Sister   . Melanoma Maternal Grandmother   . Diabetes Mellitus II Paternal Grandmother   . COPD Paternal Grandmother   . Diabetes Paternal Grandmother   . Hypertension Mother   . Hypertension Father   . Brain cancer Paternal Grandfather      ROS:  Please see the history of present illness.     All other systems reviewed and negative.    Physical Exam:  Blood pressure 110/75, pulse 65, height 5\' 6"  (1.676 m), weight 133 lb (60.3 kg), last menstrual period 03/28/2020, SpO2 97 %. General: Well developed, well nourished female in no acute distress. Head: Normocephalic, atraumatic, sclera non-icteric, no xanthomas, nares are without discharge. EENT: normal  Lymph Nodes:  none Neck: Negative for carotid bruits. JVD not elevated. Back:without scoliosis kyphosis  Lungs: Clear bilaterally to auscultation without wheezes, rales, or rhonchi. Breathing is unlabored. Heart: RRR with S1 S2. No  murmur . No rubs, or gallops appreciated. Abdomen: Soft, non-tender, non-distended with normoactive bowel sounds. No hepatomegaly. No rebound/guarding. No obvious abdominal masses. Msk:  Strength and tone appear normal for age. Extremities: No clubbing or cyanosis. No edema.  Distal pedal pulses are 2+ and equal bilaterally. Skin: Warm and Dry Neuro: Alert and oriented X 3. CN III-XII intact Grossly normal sensory and motor function . Psych:  Responds to questions appropriately with a normal affect.      Labs: Cardiac Enzymes No results for input(s): CKTOTAL, CKMB, TROPONINI in the last 72 hours. CBC Lab Results  Component Value Date   WBC  5.6 03/06/2020   HGB 12.9 03/06/2020   HCT 38.9 03/06/2020   MCV 91 03/06/2020   PLT 218 03/06/2020   PROTIME: No results for input(s): LABPROT, INR in the last 72 hours. Chemistry No results for input(s): NA, K, CL, CO2, BUN, CREATININE, CALCIUM, PROT, BILITOT, ALKPHOS, ALT, AST, GLUCOSE in the last 168 hours.  Invalid input(s): LABALBU Lipids No results found for: CHOL, HDL, LDLCALC, TRIG BNP No results found for: PROBNP Thyroid Function Tests: No results for input(s): TSH, T4TOTAL, T3FREE, THYROIDAB in the last 72 hours.  Invalid input(s): FREET3 Miscellaneous Lab Results  Component Value Date   DDIMER 0.84 (H) 03/06/2020    Radiology/Studies:  DG Cervical Spine Complete  Result Date: 03/29/2020 CLINICAL DATA:  Cervical neck pain.  No known injury. EXAM: CERVICAL SPINE - COMPLETE 4+ VIEW COMPARISON:  None. FINDINGS: AP, lateral, open-mouth odontoid, and bilateral oblique views obtained. Reversal  of normal lordosis. Otherwise normal alignment. Vertebral body heights are normal. Intervertebral disc spaces are preserved. No significant degenerative change. No bony neural foraminal narrowing. No fracture, focal bone lesion or bony destruction. Lateral masses of C1 well aligned on C2. No prevertebral soft tissue edema. IMPRESSION: Reversal of normal lordosis, can be seen with muscle spasm. Otherwise negative cervical spine radiographs. Electronically Signed   By: Keith Rake M.D.   On: 03/29/2020 16:38   DG Thoracic Spine 2 View  Result Date: 03/29/2020 CLINICAL DATA:  Low back pain. EXAM: THORACIC SPINE 2 VIEWS COMPARISON:  None. FINDINGS: AP, lateral, and lateral swimmer's views obtained. There are 12 rib-bearing thoracic vertebra. Slight dextroscoliotic curvature of the lower thoracic spine. Alignment is otherwise normal. Normal vertebral body heights. Normal intervertebral disc spaces. No fracture, focal lesion or bone destruction. No paravertebral soft tissue abnormality.  IMPRESSION: Slight dextroscoliotic curvature of the lower thoracic spine. Otherwise negative. Electronically Signed   By: Keith Rake M.D.   On: 03/29/2020 16:39   DG Lumbar Spine Complete  Result Date: 03/29/2020 CLINICAL DATA:  Left lumbar back pain.  Pain for 2 years. EXAM: LUMBAR SPINE - COMPLETE 4+ VIEW COMPARISON:  None. FINDINGS: AP, lateral, bilateral oblique, and lateral L5 spot views obtained. There are 5 non-rib-bearing lumbar vertebra. The alignment is maintained. Vertebral body heights are normal. There is no listhesis. The posterior elements are intact. Disc spaces are preserved. No fracture, focal bone lesion or bone destruction. Sacroiliac joints are symmetric and normal. IMPRESSION: Negative radiographs of the lumbar spine. Electronically Signed   By: Keith Rake M.D.   On: 03/29/2020 16:37   DG Shoulder Right  Result Date: 03/29/2020 CLINICAL DATA:  Right shoulder pain. Right arm numbness. No known injury. EXAM: RIGHT SHOULDER - 2+ VIEW COMPARISON:  None. FINDINGS: There is no evidence of fracture or dislocation. Normal joint spaces and alignment. There is no evidence of arthropathy or other focal bone abnormality. Soft tissues are unremarkable. IMPRESSION: Negative radiographs of the right shoulder. Electronically Signed   By: Keith Rake M.D.   On: 03/29/2020 16:39    EKG: sinus @ 65 15/10/43   Assessment and Plan:  Atrial ectopy-nonsustained tachycardia and PACs  Dizziness nonpositional  Shower intolerance  Presyncope-episodic   The patient has a long history of recurrent episodes of presyncope associated with abrupt onset nausea diaphoresis and orthostatic intolerance typically lasting minutes which could be autonomic/vasomotor.  With her documented nonsustained atrial tachycardia 1 wonders whether that may not be an arrhythmic trigger for this.  Does not sound like agoraphobia  The worsening of her symptoms following December is characterized by  symptoms which I am unable to understand with a unifying process.  Exercise intolerance, shower intolerance with lower extremity discoloration suggest autonomic/vasomotor issues.  Dizziness that lasts hours at a time that is aggravated by lying down and/or changing head position does not and suggests a more neuro/ENT process  She has mild objective evidence of orthostasis with increased HR 22+ with standing, but without symptoms hard to know its significance   Numbness of the arms during showers might be associated with significant hypotension; numbness of her legs while she is driving ought not to be.  It is discordant from the tunnel vision that she describes while driving; moreover, none of the symptoms have abated by either fluid or becoming recumbent.  I have told her I do not have a good explanation for her symptoms but have suggested that perhaps her PCP can refer her  for  ENT and/or reevaluation by neurology   With a relatively abrupt onset of her symptoms in December 2021 I wonder whether this is a post viral syndrome of some kind.         Virl Axe

## 2020-04-27 ENCOUNTER — Ambulatory Visit (INDEPENDENT_AMBULATORY_CARE_PROVIDER_SITE_OTHER): Payer: Commercial Managed Care - PPO | Admitting: Adult Health

## 2020-04-27 ENCOUNTER — Other Ambulatory Visit: Payer: Self-pay

## 2020-04-27 ENCOUNTER — Encounter: Payer: Self-pay | Admitting: Adult Health

## 2020-04-27 VITALS — BP 113/75 | HR 64 | Temp 98.0°F | Resp 16 | Wt 134.0 lb

## 2020-04-27 DIAGNOSIS — R42 Dizziness and giddiness: Secondary | ICD-10-CM | POA: Diagnosis not present

## 2020-04-27 DIAGNOSIS — B078 Other viral warts: Secondary | ICD-10-CM | POA: Diagnosis not present

## 2020-04-27 DIAGNOSIS — F419 Anxiety disorder, unspecified: Secondary | ICD-10-CM | POA: Diagnosis not present

## 2020-04-27 DIAGNOSIS — R55 Syncope and collapse: Secondary | ICD-10-CM

## 2020-04-27 MED ORDER — HYDROXYZINE HCL 25 MG PO TABS: 25.0000 mg | ORAL_TABLET | Freq: Three times a day (TID) | ORAL | 0 refills | Status: DC

## 2020-04-27 NOTE — Progress Notes (Signed)
Established patient visit   Patient: Valerie Gardner   DOB: [birth year]   30 y.o. Female  MRN: [id number] Visit Date: 04/27/2020  Today's healthcare provider: Marcille Buffy, FNP   Chief Complaint  Patient presents with  . Follow-up   Subjective    HPI  Follow up for SVT  The patient was last seen for this 1 months ago. Changes made at last visit include decreased Metoprolol and started patient on Toprol XL 25mg .   Patient reports that heart rate stays in the 100s when exerting herself, she states that blood pressure has been remaining within normal range. She denies near syncope episodes are chest pain but states that she has had episodes of feeling light headed/dizzy   Dr. Caryl Comes recommended reevaluation by neurology as well as ENT evaluation.  Nausea is improved. Reviewed Dr. Aquilla Hacker note.  She still is having dizziness at times.  Question if some sort of post viral syndrome.   Has some warts on left fingers.   She reports good compliance with treatment. She feels that condition is Improved. She is not having side effects.    Patient  denies any fever, body aches,chills, rash, chest pain, shortness of breath, nausea, vomiting, or diarrhea.  Denies pre syncopal or syncopal episodes.    -----------------------------------------------------------------------------------------      Medications: Outpatient Medications Prior to Visit  Medication Sig  . butalbital-acetaminophen-caffeine (FIORICET) 50-325-40 MG tablet Take 1 tablet by mouth 2 (two) times daily as needed for headache.  . metoprolol succinate (TOPROL-XL) 25 MG 24 hr tablet Take half tablet (12.5mg ) by mouth twice a day  . ondansetron (ZOFRAN) 4 MG tablet Take 1 tablet (4 mg total) by mouth every 8 (eight) hours as needed for nausea or vomiting.  . [DISCONTINUED] hydrOXYzine (ATARAX/VISTARIL) 25 MG tablet Take 1-2 tablets (25-50 mg total) by mouth 3 (three) times daily.   No  facility-administered medications prior to visit.    Review of Systems  Constitutional: Negative.   HENT: Negative.   Eyes: Negative.   Respiratory: Negative.   Cardiovascular: Negative.   Gastrointestinal: Negative.   Endocrine: Negative.   Genitourinary: Negative.   Musculoskeletal: Negative.   Neurological: Positive for dizziness and headaches. Negative for numbness.  Hematological: Negative.   Psychiatric/Behavioral: Negative.     Last CBC Lab Results  Component Value Date   WBC 5.6 03/06/2020   HGB 12.9 03/06/2020   HCT 38.9 03/06/2020   MCV 91 03/06/2020   MCH 30.1 03/06/2020   RDW 11.5 (L) 03/06/2020   PLT 218 65/99/3570   Last metabolic panel Lab Results  Component Value Date   GLUCOSE 94 03/06/2020   NA 145 (H) 03/06/2020   K 4.3 03/06/2020   CL 105 03/06/2020   CO2 24 03/06/2020   BUN 6 03/06/2020   CREATININE 0.70 03/06/2020   GFRNONAA 117 03/06/2020   GFRAA 135 03/06/2020   CALCIUM 8.9 03/06/2020   PROT 6.7 03/06/2020   ALBUMIN 4.6 03/06/2020   LABGLOB 2.1 03/06/2020   AGRATIO 2.2 03/06/2020   BILITOT <0.2 03/06/2020   ALKPHOS 60 03/06/2020   AST 17 03/06/2020   ALT 21 03/06/2020   ANIONGAP 10 03/13/2018   Last lipids No results found for: CHOL, HDL, LDLCALC, LDLDIRECT, TRIG, CHOLHDL Last hemoglobin A1c Lab Results  Component Value Date   HGBA1C 5.0 03/06/2020   Last thyroid functions Lab Results  Component Value Date   TSH 3.450 03/06/2020   Last vitamin D No results found for:  25OHVITD2, 25OHVITD3, VD25OH Last vitamin B12 and Folate Lab Results  Component Value Date   VITAMINB12 534 03/06/2020       Objective    BP 113/75   Pulse 64   Temp 98 F (36.7 C) (Oral)   Resp 16   Wt 134 lb (60.8 kg)   LMP 03/28/2020   SpO2 100%   BMI 21.63 kg/m  BP Readings from Last 3 Encounters:  04/27/20 113/75  04/26/20 110/75  03/28/20 101/84   Wt Readings from Last 3 Encounters:  04/27/20 134 lb (60.8 kg)  04/26/20 133 lb (60.3  kg)  03/28/20 127 lb (57.6 kg)       Physical Exam Vitals reviewed.  Constitutional:      General: She is not in acute distress.    Appearance: She is well-developed. She is not diaphoretic.     Interventions: She is not intubated.    Comments: Patient is alert and oriented and responsive to questions Engages in eye contact with provider. Speaks in full sentences without any pauses without any shortness of breath or distress.    HENT:     Head: Normocephalic and atraumatic.     Right Ear: External ear normal.     Left Ear: External ear normal.     Nose: Nose normal.     Mouth/Throat:     Mouth: Mucous membranes are moist.     Pharynx: No oropharyngeal exudate.  Eyes:     General: Lids are normal. No scleral icterus.       Right eye: No discharge.        Left eye: No discharge.     Conjunctiva/sclera: Conjunctivae normal.     Right eye: Right conjunctiva is not injected. No exudate or hemorrhage.    Left eye: Left conjunctiva is not injected. No exudate or hemorrhage.    Pupils: Pupils are equal, round, and reactive to light.  Neck:     Thyroid: No thyroid mass or thyromegaly.     Vascular: Normal carotid pulses. No carotid bruit, hepatojugular reflux or JVD.     Trachea: Trachea and phonation normal. No tracheal tenderness or tracheal deviation.     Meningeal: Brudzinski's sign and Kernig's sign absent.  Cardiovascular:     Rate and Rhythm: Normal rate and regular rhythm.     Pulses: Normal pulses.          Radial pulses are 2+ on the right side and 2+ on the left side.       Dorsalis pedis pulses are 2+ on the right side and 2+ on the left side.       Posterior tibial pulses are 2+ on the right side and 2+ on the left side.     Heart sounds: Normal heart sounds, S1 normal and S2 normal. Heart sounds not distant. No murmur heard. No friction rub. No gallop.   Pulmonary:     Effort: Pulmonary effort is normal. No tachypnea, bradypnea, accessory muscle usage or respiratory  distress. She is not intubated.     Breath sounds: Normal breath sounds. No stridor. No wheezing, rhonchi or rales.  Chest:     Chest wall: No tenderness.  Breasts:     Right: No supraclavicular adenopathy.     Left: No supraclavicular adenopathy.    Abdominal:     General: Bowel sounds are normal. There is no distension or abdominal bruit.     Palpations: Abdomen is soft. There is no shifting dullness, fluid wave, hepatomegaly, splenomegaly,  mass or pulsatile mass.     Tenderness: There is no abdominal tenderness. There is no right CVA tenderness, left CVA tenderness, guarding or rebound.     Hernia: No hernia is present.  Musculoskeletal:        General: No tenderness or deformity. Normal range of motion.     Right hand: Normal.     Left hand: Normal.     Cervical back: Full passive range of motion without pain, normal range of motion and neck supple. No edema, erythema or rigidity. No spinous process tenderness or muscular tenderness. Normal range of motion.     Comments: Right 2nd finger with wart like growth skin is white to flesh color at nail edge and appears to be encroaching under nail bed.  Various other small warts on right hand.  No drainage or open skin. No erythema   Lymphadenopathy:     Head:     Right side of head: No submental, submandibular, tonsillar, preauricular, posterior auricular or occipital adenopathy.     Left side of head: No submental, submandibular, tonsillar, preauricular, posterior auricular or occipital adenopathy.     Cervical: No cervical adenopathy.     Right cervical: No superficial, deep or posterior cervical adenopathy.    Left cervical: No superficial, deep or posterior cervical adenopathy.     Upper Body:     Right upper body: No supraclavicular or pectoral adenopathy.     Left upper body: No supraclavicular or pectoral adenopathy.  Skin:    General: Skin is warm and dry.     Coloration: Skin is not pale.     Findings: No abrasion, bruising,  burn, ecchymosis, erythema, lesion, petechiae or rash.     Nails: There is no clubbing.  Neurological:     Mental Status: She is alert and oriented to person, place, and time. Mental status is at baseline.     GCS: GCS eye subscore is 4. GCS verbal subscore is 5. GCS motor subscore is 6.     Cranial Nerves: No cranial nerve deficit.     Sensory: No sensory deficit.     Motor: No weakness, tremor, atrophy, abnormal muscle tone or seizure activity.     Coordination: Coordination normal.     Gait: Gait normal.     Deep Tendon Reflexes: Reflexes are normal and symmetric. Reflexes normal. Babinski sign absent on the right side. Babinski sign absent on the left side.     Reflex Scores:      Tricep reflexes are 2+ on the right side and 2+ on the left side.      Bicep reflexes are 2+ on the right side and 2+ on the left side.      Brachioradialis reflexes are 2+ on the right side and 2+ on the left side.      Patellar reflexes are 2+ on the right side and 2+ on the left side.      Achilles reflexes are 2+ on the right side and 2+ on the left side. Psychiatric:        Mood and Affect: Mood normal.        Speech: Speech normal.        Behavior: Behavior normal.        Thought Content: Thought content normal.        Judgment: Judgment normal.     No results found for any visits on 04/27/20.  Assessment & Plan    Postural dizziness with near syncope - Plan:  Ambulatory referral to ENT  Anxiety - Plan: hydrOXYzine (ATARAX/VISTARIL) 25 MG tablet  Other viral warts - Plan: Ambulatory referral to Dermatology  Orders Placed This Encounter  Procedures  . Ambulatory referral to ENT  . Ambulatory referral to Dermatology  If not heard from ENT and dermatology within 2 weeks please call the office.  Dermatology referral for wart removal on left hand and full body skin check.   She did see Dr. Caryl Comes electrophysiology cardiology and he advised to have a evaluation with neurology and also with ENT  for dizziness. Sent message to neurologist at [facility] neurology for follow up. Patient advised to call neurology to schedule follow up in office as well.  She has a follow up with Dr. Caryl Comes.   Also question rather anxiety component given she is taking Atarax PRN TID and helping her to feel better. Offered trial of antidepressant/ anti- anxiety and she declines at this time does not feel it is this but she is not really leaving the house and her mother is driving her. She had Lexapro in past and did not like the way it made her feel.   Also question if a post viral syndrome caused symptoms.   Red Flags discussed. The patient was given clear instructions to go to ER or return to medical center if any red flags develop, symptoms do not improve, worsen or new problems develop. They verbalized understanding.   Return in about 3 months (around 07/28/2020), or if symptoms worsen or fail to improve, for at any time for any worsening symptoms, Go to Emergency room/ urgent care if worse.     The entirety of the information documented in the History of Present Illness, Review of Systems and Physical Exam were personally obtained by me. Portions of this information were initially documented by the CMA and reviewed by me for thoroughness and accuracy.   Marcille Buffy, [place] [phone] (phone) [phone] (fax)  [place]

## 2020-04-27 NOTE — Patient Instructions (Signed)
Dizziness Dizziness is a common problem. It makes you feel unsteady or light-headed. You may feel like you are about to pass out (faint). Dizziness can lead to getting hurt if you stumble or fall. Dizziness can be caused by many things, including:  Medicines.  Not having enough water in your body (dehydration).  Illness. Follow these instructions at home: Eating and drinking  Drink enough fluid to keep your pee (urine) clear or pale yellow. This helps to keep you from getting dehydrated. Try to drink more clear fluids, such as water.  Do not drink alcohol.  Limit how much caffeine you drink or eat, if your doctor tells you to do that.  Limit how much salt (sodium) you drink or eat, if your doctor tells you to do that.   Activity  Avoid making quick movements. ? When you stand up from sitting in a chair, steady yourself until you feel okay. ? In the morning, first sit up on the side of the bed. When you feel okay, stand slowly while you hold onto something. Do this until you know that your balance is fine.  If you need to stand in one place for a long time, move your legs often. Tighten and relax the muscles in your legs while you are standing.  Do not drive or use heavy machinery if you feel dizzy.  Avoid bending down if you feel dizzy. Place items in your home so you can reach them easily without leaning over.   Lifestyle  Do not use any products that contain nicotine or tobacco, such as cigarettes and e-cigarettes. If you need help quitting, ask your doctor.  Try to lower your stress level. You can do this by using methods such as yoga or meditation. Talk with your doctor if you need help. General instructions  Watch your dizziness for any changes.  Take over-the-counter and prescription medicines only as told by your doctor. Talk with your doctor if you think that you are dizzy because of a medicine that you are taking.  Tell a friend or a family member that you are feeling  dizzy. If he or she notices any changes in your behavior, have this person call your doctor.  Keep all follow-up visits as told by your doctor. This is important. Contact a doctor if:  Your dizziness does not go away.  Your dizziness or light-headedness gets worse.  You feel sick to your stomach (nauseous).  You have trouble hearing.  You have new symptoms.  You are unsteady on your feet.  You feel like the room is spinning. Get help right away if:  You throw up (vomit) or have watery poop (diarrhea), and you cannot eat or drink anything.  You have trouble: ? Talking. ? Walking. ? Swallowing. ? Using your arms, hands, or legs.  You feel generally weak.  You are not thinking clearly, or you have trouble forming sentences. A friend or family member may notice this.  You have: ? Chest pain. ? Pain in your belly (abdomen). ? Shortness of breath. ? Sweating.  Your vision changes.  You are bleeding.  You have a very bad headache.  You have neck pain or a stiff neck.  You have a fever. These symptoms may be an emergency. Do not wait to see if the symptoms will go away. Get medical help right away. Call your local emergency services (911 in the [place] Do not drive yourself to the hospital. Summary  Dizziness makes you feel unsteady or   light-headed. You may feel like you are about to pass out (faint).  Drink enough fluid to keep your pee (urine) clear or pale yellow. Do not drink alcohol.  Avoid making quick movements if you feel dizzy.  Watch your dizziness for any changes. This information is not intended to replace advice given to you by your health care provider. Make sure you discuss any questions you have with your health care provider. Document Revised: 10/12/2019 Document Reviewed: 02/07/2016 Elsevier Patient Education  2021 [place]. Warts  Warts are small growths on the skin. They are common, and they are caused by a virus. Warts can be found on  many parts of the body. A person may have one wart or many warts. Most warts will go away on their own with time, but this could take many months to a few years. Treatments may be done if needed. What are the causes? Warts are caused by a type of virus that is called HPV.  This virus can spread from person to person through touching.  Warts can also spread to other parts of the body when a person scratches a wart and then scratches normal skin. What increases the risk? You are more likely to get warts if:  You are 30-30 years old.  You have a weak body defense system (immune system).  You are Caucasian. What are the signs or symptoms? The main symptom of this condition is small growths on the skin. Warts may:  Be round, oval, or have an uneven shape.  Feel rough to the touch.  Be the color of your skin or light yellow, brown, or gray.  Often be less than  inch (1.3 cm) in size.  Go away and then come back again. Most warts do not hurt, but some can hurt if they are large or if they are on the bottom of your feet. How is this diagnosed? A wart can often be diagnosed by how it looks. In some cases, the doctor might remove a little bit of the wart to test it (biopsy). How is this treated? Most of the time, warts do not need treatment. Sometimes people want warts removed. If treatment is needed or wanted, options may include:  Putting creams or patches with medicine in them on the wart.  Putting duct tape over the top of the wart.  Freezing the wart.  Burning the wart with: ? A laser. ? An electric probe.  Giving a shot of medicine into the wart to help the body's defense system fight off the wart.  Surgery to remove the wart. Follow these instructions at home: Medicines  Apply over-the-counter and prescription medicines only as told by your doctor.  Do not apply over-the-counter wart medicines to your face or genitals before you ask your doctor if it is okay to do  that. Lifestyle  Keep your body's defense system healthy. To do this: ? Eat a healthy diet. ? Get enough sleep. ? Do not use any products that contain nicotine or tobacco, such as cigarettes and e-cigarettes. If you need help quitting, ask your doctor. General instructions  Wash your hands after you touch a wart.  Do not scratch or pick at a wart.  Avoid shaving hair that is over a wart.  Keep all follow-up visits as told by your doctor. This is important.   Contact a doctor if:  Your warts do not get better after treatment.  You have redness, swelling, or pain at the site of a  wart.  You have bleeding from a wart, and the bleeding does not stop when you put light pressure on the wart.  You have diabetes and you get a wart. Summary  Warts are small growths on the skin. They are common, and they are caused by a virus.  Most of the time, warts do not need treatment. Sometimes people want warts removed. If treatment is needed or wanted, there are many options.  Apply over-the-counter and prescription medicines only as told by your doctor.  Wash your hands after you touch a wart.  Keep all follow-up visits as told by your doctor. This is important. This information is not intended to replace advice given to you by your health care provider. Make sure you discuss any questions you have with your health care provider. Document Revised: 06/09/2017 Document Reviewed: 06/09/2017 Elsevier Patient Education  [place].

## 2020-05-03 ENCOUNTER — Encounter: Payer: Self-pay | Admitting: Adult Health

## 2020-05-08 ENCOUNTER — Encounter: Payer: Self-pay | Admitting: Adult Health

## 2020-05-08 ENCOUNTER — Other Ambulatory Visit: Payer: Self-pay | Admitting: Adult Health

## 2020-05-09 NOTE — Telephone Encounter (Signed)
Please review. KW 

## 2020-05-10 ENCOUNTER — Other Ambulatory Visit: Payer: Self-pay | Admitting: Adult Health

## 2020-05-10 MED ORDER — BUTALBITAL-APAP-CAFFEINE 50-325-40 MG PO TABS: 1.0000 | ORAL_TABLET | Freq: Two times a day (BID) | ORAL | 0 refills | Status: DC | PRN

## 2020-05-10 MED ORDER — METOPROLOL SUCCINATE ER 25 MG PO TB24: 25.0000 mg | ORAL_TABLET | Freq: Every day | ORAL | 1 refills | Status: DC

## 2020-05-10 NOTE — Progress Notes (Signed)
Outpatient Encounter Medications as of 05/10/2020  Medication Sig  . butalbital-acetaminophen-caffeine (FIORICET) 50-325-40 MG tablet Take 1 tablet by mouth 2 (two) times daily as needed for headache.  . hydrOXYzine (ATARAX/VISTARIL) 25 MG tablet Take 1-2 tablets (25-50 mg total) by mouth 3 (three) times daily.  . metoprolol succinate (TOPROL-XL) 25 MG 24 hr tablet Take 1 tablet (25 mg total) by mouth daily. Take half tablet (12.5mg ) by mouth twice a day  . ondansetron (ZOFRAN) 4 MG tablet Take 1 tablet (4 mg total) by mouth every 8 (eight) hours as needed for nausea or vomiting.  . [DISCONTINUED] metoprolol succinate (TOPROL-XL) 25 MG 24 hr tablet Take half tablet (12.5mg ) by mouth twice a day   No facility-administered encounter medications on file as of 05/10/2020.

## 2020-05-21 ENCOUNTER — Other Ambulatory Visit: Payer: Self-pay | Admitting: Adult Health

## 2020-05-21 DIAGNOSIS — F419 Anxiety disorder, unspecified: Secondary | ICD-10-CM

## 2020-05-21 NOTE — Telephone Encounter (Signed)
   Notes to clinic:  Patient requesting to have a 90 day supply instead of 30 day Review for change and refill    Requested Prescriptions  Pending Prescriptions Disp Refills   hydrOXYzine (ATARAX/VISTARIL) 25 MG tablet [Pharmacy Med Name: HYDROXYZINE HCL 25 MG TABLET] 270 tablet 1    Sig: Take 1-2 tablets (25-50 mg total) by mouth 3 (three) times daily.      Ear, Nose, and Throat:  Antihistamines Passed - 05/21/2020  1:34 PM      Passed - Valid encounter within last 12 months    Recent Outpatient Visits           3 weeks ago Postural dizziness with near syncope   [place], FNP   1 month ago Chronic left-sided low back pain without sciatica   [place], FNP   2 months ago [place], FNP       Future Appointments             In 2 months Ralene Bathe, MD [place]   In 2 months Fl[place], Kelby Aline, [place], [place]   In 2 months Deboraha Sprang, MD [facility], [employer]

## 2020-06-14 ENCOUNTER — Other Ambulatory Visit: Payer: Self-pay | Admitting: Adult Health

## 2020-06-14 DIAGNOSIS — F419 Anxiety disorder, unspecified: Secondary | ICD-10-CM

## 2020-06-14 NOTE — Telephone Encounter (Signed)
   Notes to clinic:  Requesting a 90 day supply    Requested Prescriptions  Pending Prescriptions Disp Refills   hydrOXYzine (ATARAX/VISTARIL) 25 MG tablet [Pharmacy Med Name: HYDROXYZINE HCL 25 MG TABLET] 540 tablet 1    Sig: TAKE 1-2 TABLETS (25-50 MG TOTAL) BY MOUTH 3 (THREE) TIMES DAILY.      Ear, Nose, and Throat:  Antihistamines Passed - 06/14/2020  2:35 PM      Passed - Valid encounter within last 12 months    Recent Outpatient Visits           1 month ago Postural dizziness with near syncope   [place], FNP   2 months ago Chronic left-sided low back pain without sciatica   [facility] Flinchum, Kelby Aline, FNP   3 months ago [place] Flinchum, Kelby Aline, FNP       Future Appointments             In 1 month Ralene Bathe, MD [place]   In 1 month Flinchum, Kelby Aline, [place], [place]   In 1 month Deboraha Sprang, MD [facility], [place]

## 2020-06-26 ENCOUNTER — Other Ambulatory Visit: Payer: Self-pay | Admitting: Adult Health

## 2020-06-26 ENCOUNTER — Encounter: Payer: Self-pay | Admitting: Adult Health

## 2020-06-26 MED ORDER — METOPROLOL SUCCINATE ER 25 MG PO TB24: 25.0000 mg | ORAL_TABLET | Freq: Every day | ORAL | 0 refills | Status: DC

## 2020-06-26 NOTE — Progress Notes (Signed)
Meds ordered this encounter  Medications  . metoprolol succinate (TOPROL-XL) 25 MG 24 hr tablet    Sig: Take 1 tablet (25 mg total) by mouth daily. Take half tablet (12.5mg ) by mouth twice a day    Dispense:  90 tablet    Refill:  0

## 2020-06-27 ENCOUNTER — Other Ambulatory Visit: Payer: Self-pay | Admitting: Adult Health

## 2020-06-27 NOTE — Telephone Encounter (Signed)
Called and spoke to [facility]. They had confusion and were claiming that "Take 1 tablet (25 mg total) by mouth daily. Take half tablet (12.5mg ) by mouth twice a day" were two separate instructions and that it was not clear. Was transferred over to another Pharmacist named Ilona Sorrel. Ilona Sorrel understood that they were the same instruction and states that he will get the medication ready to be filled and picked up by the patient.

## 2020-07-23 ENCOUNTER — Ambulatory Visit: Payer: Commercial Managed Care - PPO | Admitting: Dermatology

## 2020-07-23 ENCOUNTER — Other Ambulatory Visit: Payer: Self-pay

## 2020-07-23 DIAGNOSIS — B078 Other viral warts: Secondary | ICD-10-CM | POA: Diagnosis not present

## 2020-07-23 NOTE — Progress Notes (Signed)
   New Patient Visit  Subjective  Valerie Gardner is a 30 y.o. female who presents for the following: Warts (New patient here today for warts at hands and right leg. Present for years, previously treated at home with over the counter wart medicines. ).  The following portions of the chart were reviewed this encounter and updated as appropriate:   Tobacco  Allergies  Meds  Problems  Med Hx  Surg Hx  Fam Hx      Review of Systems:  No other skin or systemic complaints except as noted in HPI or Assessment and Plan.  Objective  Well appearing patient in no apparent distress; mood and affect are within normal limits.  A focused examination was performed including extremities, including the arms, hands, fingers, and fingernails and the legs, feet, toes, and toenails. Relevant physical exam findings are noted in the Assessment and Plan.  Right palm, right middle fingertip, right ring finger periungual, right index finger periungual, left palm, left lat base of index finger, right med ankle, right lat above ankle area (8) Verrucous papules -- Discussed viral etiology and contagion.  0.6cm right index finger periungual 1.2cm x 0.6cm right ring finger periungual   Assessment & Plan  Other viral warts (8) Right palm, right middle fingertip, right ring finger periungual, right index finger periungual, left palm, left lat base of index finger, right med ankle, right lat above ankle area  Squaric Acid 3% applied to left upper inner arm and covered with band aid. Patient has topical steroid available to treat topically if rash occurs.   Squaric Acid 3% applied to warts today. Prior to application reviewed risk of inflammation and irritation.  Consider adding candida injection to right ring finger periungual at follow up.    Destruction of lesion - Right palm, right middle fingertip, right ring finger periungual, right index finger periungual, left palm, left lat base of index finger,  right med ankle, right lat above ankle area Complexity: simple   Destruction method: cryotherapy   Informed consent: discussed and consent obtained   Timeout:  patient name, date of birth, surgical site, and procedure verified Lesion destroyed using liquid nitrogen: Yes   Region frozen until ice ball extended beyond lesion: Yes   Outcome: patient tolerated procedure well with no complications   Post-procedure details: wound care instructions given    Destruction of lesion - Right palm, right middle fingertip, right ring finger periungual, right index finger periungual, left palm, left lat base of index finger, right med ankle, right lat above ankle area  Destruction method: chemical removal   Chemical destruction method: cantharidin   Chemical destruction method comment:  Cantharidin plus Application time:  4 hours  Return in about 6 weeks (around 09/03/2020) for Wart.  Graciella Belton, RMA, am acting as scribe for Sarina Ser, MD . Documentation: I have reviewed the above documentation for accuracy and completeness, and I agree with the above.  Sarina Ser, MD

## 2020-07-23 NOTE — Patient Instructions (Signed)
Viral Warts & Molluscum Contagiosum  Viral warts and molluscum contagiosum are growths of the skin caused by viral infection of the skin. If you have been given the diagnosis of viral warts or molluscum contagiosum there are a few things that you must understand about your condition:  There is no guaranteed treatment method available for this condition. Multiple treatments may be required, The treatments may be time consuming and require multiple visits to the dermatology office. The treatment may be expensive. You will be charged each time you come into the office to have the spots treated. The treated areas may develop new lesions further complicating treatment. The treated areas may leave a scar. There is no guarantee that even after multiple treatments that the spots will be successfully treated. These are caused by a viral infection and can be spread to other areas of the skin and to other people by direct contact. Therefore, new spots may occur.  Instructions for After In-Office Application of Cantharidin  1. This is a strong medicine; please follow ALL instructions.  2. Gently wash off with soap and water in four hours or sooner s directed by your physician.  3. **WARNING** this medicine can cause severe blistering, blood blisters, infection, and/or scarring if it is not washed off as directed.  4. Your progress will be rechecked in 1-2 months; call sooner if there are any questions or problems.  Cantharidin is a blistering agent that comes from a beetle.  It needs to be washed off in about 4 hours after application.  Although it is painless when applied in office, it may cause symptoms of mild pain and burning several hours later.  Treated areas will swell and turn red, and blisters may form.  Vaseline and a bandaid may be applied until wound has healed.  Once healed, the skin may remain temporarily discolored.  It can take weeks to months for pigmentation to return to normal.

## 2020-07-24 ENCOUNTER — Other Ambulatory Visit: Payer: Self-pay | Admitting: Adult Health

## 2020-07-25 NOTE — Telephone Encounter (Signed)
Pharmacy called and prescription was clarified.

## 2020-07-26 ENCOUNTER — Encounter: Payer: Self-pay | Admitting: Dermatology

## 2020-07-30 ENCOUNTER — Encounter: Payer: Self-pay | Admitting: Adult Health

## 2020-08-01 ENCOUNTER — Ambulatory Visit: Payer: Commercial Managed Care - PPO | Admitting: Adult Health

## 2020-08-02 ENCOUNTER — Ambulatory Visit: Payer: Commercial Managed Care - PPO | Admitting: Internal Medicine

## 2020-08-04 ENCOUNTER — Other Ambulatory Visit: Payer: Self-pay | Admitting: Adult Health

## 2020-08-12 ENCOUNTER — Other Ambulatory Visit: Payer: Self-pay | Admitting: Family

## 2020-08-14 ENCOUNTER — Other Ambulatory Visit: Payer: Self-pay

## 2020-08-14 MED ORDER — METOPROLOL SUCCINATE ER 25 MG PO TB24: 12.5000 mg | ORAL_TABLET | Freq: Two times a day (BID) | ORAL | 2 refills | Status: DC

## 2020-08-24 ENCOUNTER — Other Ambulatory Visit: Payer: Self-pay

## 2020-08-24 MED ORDER — METOPROLOL SUCCINATE ER 25 MG PO TB24: 12.5000 mg | ORAL_TABLET | Freq: Two times a day (BID) | ORAL | 2 refills | Status: DC

## 2020-09-20 ENCOUNTER — Encounter: Payer: Self-pay | Admitting: Adult Health

## 2020-09-20 DIAGNOSIS — R110 Nausea: Secondary | ICD-10-CM

## 2020-09-24 MED ORDER — BUTALBITAL-APAP-CAFFEINE 50-325-40 MG PO TABS: 1.0000 | ORAL_TABLET | Freq: Two times a day (BID) | ORAL | 0 refills | Status: DC | PRN

## 2020-09-24 MED ORDER — ONDANSETRON HCL 4 MG PO TABS: 4.0000 mg | ORAL_TABLET | Freq: Three times a day (TID) | ORAL | 0 refills | Status: DC | PRN

## 2020-10-02 ENCOUNTER — Ambulatory Visit: Payer: Commercial Managed Care - PPO | Admitting: Adult Health

## 2020-10-03 ENCOUNTER — Other Ambulatory Visit: Payer: Self-pay

## 2020-10-03 ENCOUNTER — Ambulatory Visit: Payer: Commercial Managed Care - PPO | Admitting: Dermatology

## 2020-10-03 DIAGNOSIS — B079 Viral wart, unspecified: Secondary | ICD-10-CM

## 2020-10-03 NOTE — Progress Notes (Signed)
   Follow-Up Visit   Subjective  Valerie Gardner is a 30 y.o. female who presents for the following: Warts (Bil hands, R ankle, some cleared, new one on L thumb, 6 wk f/u LN2, Squaric Acid, Cantharone plus and Squaric Acid sensitization ).  The following portions of the chart were reviewed this encounter and updated as appropriate:   Tobacco  Allergies  Meds  Problems  Med Hx  Surg Hx  Fam Hx     Review of Systems:  No other skin or systemic complaints except as noted in HPI or Assessment and Plan.  Objective  Well appearing patient in no apparent distress; mood and affect are within normal limits.  A focused examination was performed including bil hands, R ankle. Relevant physical exam findings are noted in the Assessment and Plan.  R ring/4th periungual x 1, R index periungual x 1, L thumb x 1, R middle finger x 1 (4) Verrucous papules -- Discussed viral etiology and contagion R ring/4th periungual x 1, R index periungual x 1, L thumb x 1, R middle finger x 1   Assessment & Plan  Viral warts, unspecified type (4) R ring/4th periungual x 1, R index periungual x 1, L thumb x 1, R middle finger x 1  Discussed viral etiology and risk of spread.  Discussed multiple treatments may be required to clear warts.  Discussed possible post-treatment dyspigmentation and risk of recurrence.  Discussed topical treatment to use at home pt declines  LN2 x  4 Squaric Acid 3% x 4 Cantharone plus x 4  Destruction of lesion - R ring/4th periungual x 1, R index periungual x 1, L thumb x 1, R middle finger x 1 Complexity: simple   Destruction method: cryotherapy   Informed consent: discussed and consent obtained   Timeout:  patient name, date of birth, surgical site, and procedure verified Lesion destroyed using liquid nitrogen: Yes   Region frozen until ice ball extended beyond lesion: Yes   Outcome: patient tolerated procedure well with no complications   Post-procedure details:  wound care instructions given    Destruction of lesion - R ring/4th periungual x 1, R index periungual x 1, L thumb x 1, R middle finger x 1  Destruction method: chemical removal   Informed consent: discussed and consent obtained   Timeout:  patient name, date of birth, surgical site, and procedure verified Chemical destruction method comment:  Squaric Acid 3%, Cantharone plus Application time:  6 hours Procedure instructions: patient instructed to wash and dry area   Outcome: patient tolerated procedure well with no complications   Post-procedure details: wound care instructions given   Additional details:  Patient advised to set alarm to remind them to wash off with soap and water at the directed time.  Return in about 6 weeks (around 11/14/2020) for wart f/u.  I, Othelia Pulling, RMA, am acting as scribe for Sarina Ser, MD  Documentation: I have reviewed the above documentation for accuracy and completeness, and I agree with the above.  Sarina Ser, MD

## 2020-10-03 NOTE — Patient Instructions (Signed)

## 2020-10-04 ENCOUNTER — Encounter: Payer: Self-pay | Admitting: Dermatology

## 2020-11-06 ENCOUNTER — Ambulatory Visit: Payer: Commercial Managed Care - PPO | Admitting: Adult Health

## 2020-11-07 ENCOUNTER — Encounter: Payer: Self-pay | Admitting: Dermatology

## 2020-11-07 ENCOUNTER — Other Ambulatory Visit: Payer: Self-pay

## 2020-11-07 ENCOUNTER — Ambulatory Visit: Payer: Commercial Managed Care - PPO | Admitting: Dermatology

## 2020-11-07 DIAGNOSIS — L235 Allergic contact dermatitis due to other chemical products: Secondary | ICD-10-CM

## 2020-11-07 DIAGNOSIS — B079 Viral wart, unspecified: Secondary | ICD-10-CM

## 2020-11-07 NOTE — Progress Notes (Signed)
   Follow-Up Visit   Subjective  Valerie Gardner is a 30 y.o. female who presents for the following: Warts (R ring/4th periungual, R index periungual, L thumb, R middle finger, 6wk f/u, LN2, Squaric acid 3%, and Cantharone plus).  The following portions of the chart were reviewed this encounter and updated as appropriate:   Tobacco  Allergies  Meds  Problems  Med Hx  Surg Hx  Fam Hx     Review of Systems:  No other skin or systemic complaints except as noted in HPI or Assessment and Plan.  Objective  Well appearing patient in no apparent distress; mood and affect are within normal limits.  A focused examination was performed including bil hands. Relevant physical exam findings are noted in the Assessment and Plan.  R ring/4th periungual x 1, R index periungual x 1, R middle finger x 1 (3) Verrucous papules -- Discussed viral etiology and contagion.   Left Upper Arm - Anterior Pinkness L upper inner arm   Assessment & Plan  Viral warts,  (3) R ring/4th periungual x 1, R index periungual x 1, R middle finger x 1  Discussed viral etiology and risk of spread.  Discussed multiple treatments may be required to clear warts.  Discussed possible post-treatment dyspigmentation and risk of recurrence.  LN2 x 3 Squaric Acid 3% x 3 Cantharone plus x 3  Destruction of lesion - R ring/4th periungual x 1, R index periungual x 1, R middle finger x 1 Complexity: simple   Destruction method: cryotherapy   Informed consent: discussed and consent obtained   Timeout:  patient name, date of birth, surgical site, and procedure verified Lesion destroyed using liquid nitrogen: Yes   Region frozen until ice ball extended beyond lesion: Yes   Outcome: patient tolerated procedure well with no complications   Post-procedure details: wound care instructions given    Destruction of lesion - R ring/4th periungual x 1, R index periungual x 1, R middle finger x 1  Destruction method: chemical  removal   Informed consent: discussed and consent obtained   Timeout:  patient name, date of birth, surgical site, and procedure verified Chemical destruction method: cantharidin   Chemical destruction method comment:  Squaric Acid 3%, Cantharone plus Application time:  4 hours Procedure instructions: patient instructed to wash and dry area   Outcome: patient tolerated procedure well with no complications   Post-procedure details: wound care instructions given    Allergic dermatitis due to other chemical product Left Upper Arm - Anterior  2ndary to Squaric Acid 3% senisitization 07/23/20  Good reaction to squaric acid sensitization   Return in about 6 weeks (around 12/19/2020) for wart f/u.  I, Othelia Pulling, RMA, am acting as scribe for Sarina Ser, MD . Documentation: I have reviewed the above documentation for accuracy and completeness, and I agree with the above.  Sarina Ser, MD

## 2020-11-07 NOTE — Patient Instructions (Addendum)
If you have any questions or concerns for your doctor, please call our main line at [phone] and press option 4 to reach your doctor's medical assistant. If no one answers, please leave a voicemail as directed and we will return your call as soon as possible. Messages left after 4 pm will be answered the following business day.   You may also send [place] a message via [place]. We typically respond to MyChart messages within 1-2 business days.  For prescription refills, please ask your pharmacy to contact our office. Our fax number is [phone].  If you have an urgent issue when the clinic is closed that cannot wait until the next business day, you can page your doctor at the number below.    Please note that while we do our best to be available for urgent issues outside of office hours, we are not available 24/7.   If you have an urgent issue and are unable to reach [place], you may choose to seek medical care at your doctor's office, retail clinic, urgent care center, or emergency room.  If you have a medical emergency, please immediately call 911 or go to the emergency department.  Pager Numbers  - Dr. Nehemiah Massed: [phone]  - Dr. Laurence Ferrari: [phone]  - Dr. Nicole Kindred: [phone]  In the event of inclement weather, please call our main line at [phone] for an update on the status of any delays or closures.  Dermatology Medication Tips: Please keep the boxes that topical medications come in in order to help keep track of the instructions about where and how to use these. Pharmacies typically print the medication instructions only on the boxes and not directly on the medication tubes.   If your medication is too expensive, please contact our office at [phone] option 4 or send [place] a message through [place].   We are unable to tell what your co-pay for medications will be in advance as this is different depending on your insurance coverage. However, we may be able to find a substitute  medication at lower cost or fill out paperwork to get insurance to cover a needed medication.   If a prior authorization is required to get your medication covered by your insurance company, please allow [place] 1-2 business days to complete this process.  Drug prices often vary depending on where the prescription is filled and some pharmacies may offer cheaper prices.  The website www.goodrx.com contains coupons for medications through different pharmacies. The prices here do not account for what the cost may be with help from insurance (it may be cheaper with your insurance), but the website can give you the price if you did not use any insurance.  - You can print the associated coupon and take it with your prescription to the pharmacy.  - You may also stop by our office during regular business hours and pick up a GoodRx coupon card.  - If you need your prescription sent electronically to a different pharmacy, notify our office through [facility] or by phone at [phone] option 4.   Instructions for After In-Office Application of Cantharidin  1. This is a strong medicine; please follow ALL instructions.  2. Gently wash off with soap and water in four hours or sooner s directed by your physician.  3. **WARNING** this medicine can cause severe blistering, blood blisters, infection, and/or scarring if it is not washed off as directed.  4. Your progress will be rechecked in 1-2 months; call sooner if there are  any questions or problems.

## 2020-12-10 ENCOUNTER — Other Ambulatory Visit: Payer: Self-pay | Admitting: Family Medicine

## 2020-12-10 DIAGNOSIS — F419 Anxiety disorder, unspecified: Secondary | ICD-10-CM

## 2020-12-10 NOTE — Telephone Encounter (Signed)
No future OV on chart. Approved per protocol for 90 day supply.  Requested Prescriptions  Pending Prescriptions Disp Refills  . hydrOXYzine (ATARAX/VISTARIL) 25 MG tablet [Pharmacy Med Name: HYDROXYZINE HCL 25 MG TABLET] 540 tablet 0    Sig: TAKE 1-2 TABLETS (25-50 MG TOTAL) BY MOUTH 3 (THREE) TIMES DAILY.     Ear, Nose, and Throat:  Antihistamines Passed - 12/10/2020  9:56 AM      Passed - Valid encounter within last 12 months    Recent Outpatient Visits          7 months ago Postural dizziness with near syncope   [place], FNP   8 months ago Chronic left-sided low back pain without sciatica   [facility] [place], Kelby Aline, FNP   9 months ago [place], FNP      Future Appointments            In 3 months Ralene Bathe, MD [place]

## 2020-12-17 ENCOUNTER — Ambulatory Visit: Payer: Commercial Managed Care - PPO | Admitting: Dermatology

## 2021-02-24 ENCOUNTER — Other Ambulatory Visit: Payer: Self-pay | Admitting: Family Medicine

## 2021-02-25 MED ORDER — BUTALBITAL-APAP-CAFFEINE 50-325-40 MG PO TABS: 1.0000 | ORAL_TABLET | Freq: Two times a day (BID) | ORAL | 0 refills | Status: DC | PRN

## 2021-03-13 ENCOUNTER — Other Ambulatory Visit: Payer: Self-pay | Admitting: Family Medicine

## 2021-03-13 DIAGNOSIS — F419 Anxiety disorder, unspecified: Secondary | ICD-10-CM

## 2021-03-27 ENCOUNTER — Ambulatory Visit: Payer: Commercial Managed Care - PPO | Admitting: Dermatology

## 2021-04-08 ENCOUNTER — Other Ambulatory Visit: Payer: Self-pay

## 2021-04-08 ENCOUNTER — Encounter: Payer: Self-pay | Admitting: Dermatology

## 2021-04-08 ENCOUNTER — Ambulatory Visit: Payer: Commercial Managed Care - PPO | Admitting: Dermatology

## 2021-04-08 DIAGNOSIS — L738 Other specified follicular disorders: Secondary | ICD-10-CM | POA: Diagnosis not present

## 2021-04-08 DIAGNOSIS — Z8619 Personal history of other infectious and parasitic diseases: Secondary | ICD-10-CM

## 2021-04-08 DIAGNOSIS — D229 Melanocytic nevi, unspecified: Secondary | ICD-10-CM

## 2021-04-08 DIAGNOSIS — D2239 Melanocytic nevi of other parts of face: Secondary | ICD-10-CM

## 2021-04-08 NOTE — Patient Instructions (Addendum)
Recommend using Cutemol daily to cuticles/fingers.  ? ? ?If You Need Anything After Your Visit ? ?If you have any questions or concerns for your doctor, please call our main line at [phone] and press option 4 to reach your doctor's medical assistant. If no one answers, please leave a voicemail as directed and we will return your call as soon as possible. Messages left after 4 pm will be answered the following business day.  ? ?You may also send [place] a message via MyChart. We typically respond to MyChart messages within 1-2 business days. ? ?For prescription refills, please ask your pharmacy to contact our office. Our fax number is [phone]. ? ?If you have an urgent issue when the clinic is closed that cannot wait until the next business day, you can page your doctor at the number below.   ? ?Please note that while we do our best to be available for urgent issues outside of office hours, we are not available 24/7.  ? ?If you have an urgent issue and are unable to reach [place], you may choose to seek medical care at your doctor's office, retail clinic, urgent care center, or emergency room. ? ?If you have a medical emergency, please immediately call 911 or go to the emergency department. ? ?Pager Numbers ? ?- Dr. Nehemiah Massed: [phone] ? ?- Dr. Laurence Ferrari: [phone] ? ?- Dr. Nicole Kindred: [phone] ? ?In the event of inclement weather, please call our main line at [phone] for an update on the status of any delays or closures. ? ?Dermatology Medication Tips: ?Please keep the boxes that topical medications come in in order to help keep track of the instructions about where and how to use these. Pharmacies typically print the medication instructions only on the boxes and not directly on the medication tubes.  ? ?If your medication is too expensive, please contact our office at [phone] option 4 or send [place] a message through [place].  ? ?We are unable to tell what your co-pay for medications will be in advance as  this is different depending on your insurance coverage. However, we may be able to find a substitute medication at lower cost or fill out paperwork to get insurance to cover a needed medication.  ? ?If a prior authorization is required to get your medication covered by your insurance company, please allow [place] 1-2 business days to complete this process. ? ?Drug prices often vary depending on where the prescription is filled and some pharmacies may offer cheaper prices. ? ?The website www.goodrx.com contains coupons for medications through different pharmacies. The prices here do not account for what the cost may be with help from insurance (it may be cheaper with your insurance), but the website can give you the price if you did not use any insurance.  ?- You can print the associated coupon and take it with your prescription to the pharmacy.  ?- You may also stop by our office during regular business hours and pick up a GoodRx coupon card.  ?- If you need your prescription sent electronically to a different pharmacy, notify our office through [facility] or by phone at [phone] option 4. ? ? ? ? ?Si Usted Necesita Algo Despu?s de Su Visita ? ?Tambi?n puede enviarnos un mensaje a trav?s de MyChart. Por lo general respondemos a los mensajes de MyChart en el transcurso de 1 a 2 d?as h?biles. ? ?Para renovar recetas, por favor pida a su farmacia que se ponga en contacto con nuestra oficina. Oswald Hillock  n?mero de fax es el [phone]. ? ?Si tiene un asunto urgente cuando la cl?nica est? cerrada y que no puede esperar hasta el siguiente d?a h?bil, puede llamar/localizar a su doctor(a) al n?mero que aparece a continuaci?n.  ? ?Por favor, tenga en cuenta que aunque hacemos todo lo posible para estar disponibles para asuntos urgentes fuera del horario de oficina, no estamos disponibles las 24 horas del d?a, los 7 d?as de la semana.  ? ?Si tiene un problema urgente y no puede comunicarse con nosotros, puede optar por  buscar atenci?n m?dica  en el consultorio de su doctor(a), en una cl?nica privada, en un centro de atenci?n urgente o en una sala de emergencias. ? ?Si tiene Engineer, maintenance (IT) m?dica, por favor llame inmediatamente al 911 o vaya a la sala de emergencias. ? ?N?meros de b?per ? ?- Dr. Nehemiah Massed: [phone] ? ?- Dra. Moye: [phone] ? ?- Dra. Nicole Kindred: [phone] ? ?En caso de inclemencias del tiempo, por favor llame a nuestra l?nea principal al [phone] para una actualizaci?n sobre el estado de cualquier retraso o cierre. ? ?Consejos para la medicaci?n en dermatolog?a: ?Por favor, guarde las cajas en las que vienen los medicamentos de uso t?pico para ayudarle a seguir las instrucciones sobre d?nde y c?mo usarlos. Las farmacias generalmente imprimen las instrucciones del medicamento s?lo en las cajas y no directamente en los tubos del [place].  ? ?Si su medicamento es muy caro, por favor, p?ngase en contacto con Zigmund Daniel llamando al [phone] y presione la opci?n 4 o env?enos un mensaje a trav?s de MyChart.  ? ?No podemos decirle cu?l ser? su copago por los medicamentos por adelantado ya que esto es diferente dependiendo de la cobertura de su seguro. Sin embargo, es posible que podamos encontrar un medicamento sustituto a Electrical engineer un formulario para que el seguro cubra el medicamento que se considera necesario.  ? ?Si se requiere Ardelia Mems autorizaci?n previa para que su compa??a de seguros [place] su medicamento, por favor perm?tanos de 1 a 2 d?as h?biles para completar este proceso. ? ?Los precios de los medicamentos var?an con frecuencia dependiendo del Environmental consultant de d?nde se surte la receta y alguna farmacias pueden ofrecer precios m?s baratos. ? ?El sitio web www.goodrx.com tiene cupones para medicamentos de Airline pilot. Los precios aqu? no tienen en cuenta lo que podr?a costar con la ayuda del seguro (puede ser m?s barato con su seguro), pero el sitio web puede darle el precio si no  utiliz? ning?n seguro.  ?- Puede imprimir el cup?n correspondiente y llevarlo con su receta a la farmacia.  ?- Tambi?n puede pasar por nuestra oficina durante el horario de atenci?n regular y recoger una tarjeta de cupones de GoodRx.  ?- Si necesita que su receta se env?e electr?nicamente a Chiropodist, informe a nuestra oficina a trav?s de MyChart de [place] o por tel?fono llamando al [phone] y presione la opci?n 4.  ?

## 2021-04-08 NOTE — Progress Notes (Signed)
? ?  Follow-Up Visit ?  ?Subjective  ?Valerie Gardner is a 31 y.o. female who presents for the following: Warts (5 month recheck. Right hand/fingers. R ring/4th periungual, R index periungual, R middle finger. Hx of LN2, squaric acid and cantharone plus. Patient thinks areas have improved). ?The patient has spots, moles and lesions to be evaluated, some may be new or changing and the patient has concerns that these could be cancer. ? ?The following portions of the chart were reviewed this encounter and updated as appropriate:  Tobacco  Allergies  Meds  Problems  Med Hx  Surg Hx  Fam Hx   ?  ?Review of Systems: No other skin or systemic complaints except as noted in HPI or Assessment and Plan. ? ?Objective  ?Well appearing patient in no apparent distress; mood and affect are within normal limits. ? ?A focused examination was performed including hands. fingers. Relevant physical exam findings are noted in the Assessment and Plan. ? ?right ring/4th periungual, right index periungual, right middle finger ?Normal appearing skin lines today ? ?Right Chin, right nare ?0.3 cm flesh papule ? ?Right Alar Crease ?Yellowish papule with central dell. ? ? ?Assessment & Plan  ?History of verruca vulgaris ?right ring/4th periungual, right index periungual, right middle finger ?Clear. Observe for recurrence. Call clinic for new or changing lesions.  Recommend regular skin exams, daily broad-spectrum spf 30+ sunscreen use, and photoprotection.   ? ?Nevus - flesh colored papule ?Right Chin, right nare/infranasal ?Benign-appearing.  Observation.  Call clinic for new or changing lesions.  Recommend daily use of broad spectrum spf 30+ sunscreen to sun-exposed areas.   ? ?Sebaceous hyperplasia papule x1   6m ?Right nasal Alar Crease ?Benign-appearing.  Observation.  Call clinic for new or changing lesions.  Recommend daily use of broad spectrum spf 30+ sunscreen to sun-exposed areas.  ?Discussed tx option of ED - pt declines  today. ? ?Return if symptoms worsen or fail to improve. ? ?I, JEmelia Salisbury CMA, am acting as scribe for DSarina Ser MD. ?Documentation: I have reviewed the above documentation for accuracy and completeness, and I agree with the above. ? ?DSarina Ser MD ? ? ?

## 2021-04-09 ENCOUNTER — Encounter: Payer: Self-pay | Admitting: Dermatology

## 2021-04-17 IMAGING — CT CT ANGIO CHEST
1 of 2 series · 19 of 32 positions shown · IV contrast (APPLIED)
Comparison: None.

CLINICAL DATA: Tachycardia. Elevated D-dimer. Family history DVT.
On oral contraceptives. Shortness of breath.

EXAM:
CT ANGIOGRAPHY CHEST WITH CONTRAST
TECHNIQUE: Multidetector CT imaging of the chest was performed using the
standard protocol during bolus administration of intravenous
contrast. Multiplanar CT image reconstructions and MIPs were
obtained to evaluate the vascular anatomy.
CONTRAST:  100mL OMNIPAQUE IOHEXOL 350 MG/ML SOLN

[Series 11: thins · axial · 0.61mm/px · z∈[-553,-301]mm · 19 of 281 slices shown]
[im 15/281  lung]
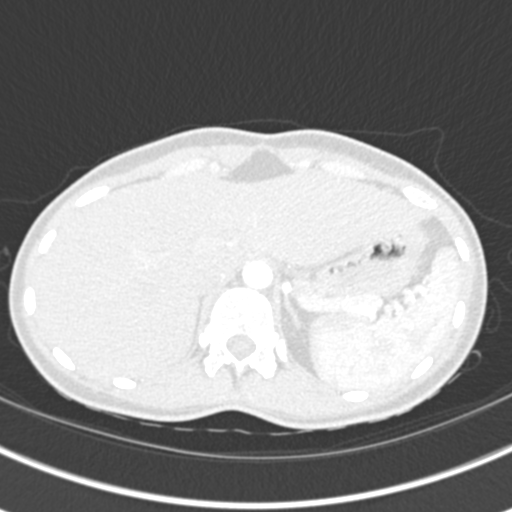
[im 29/281  mediastinal]
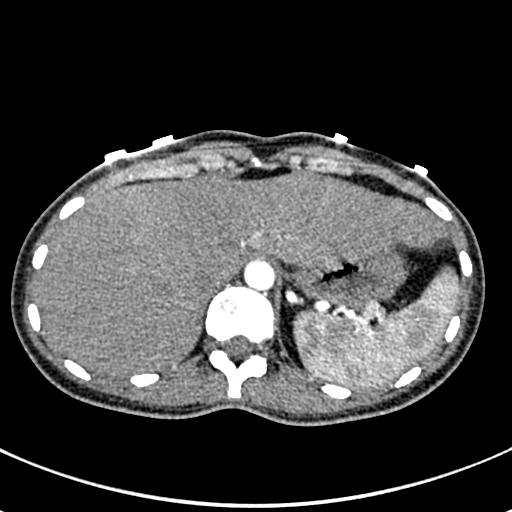
[im 43/281  lung]
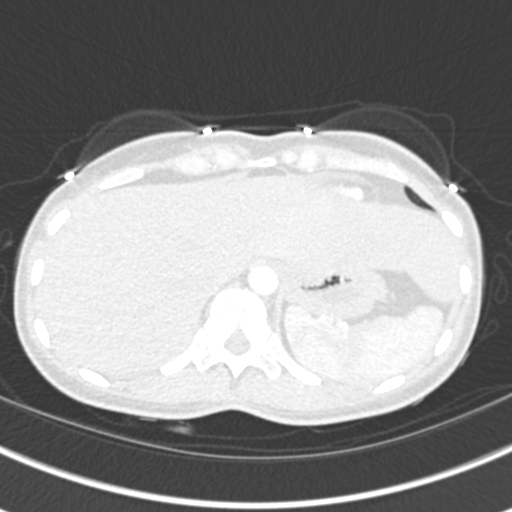
[im 71/281  mediastinal]
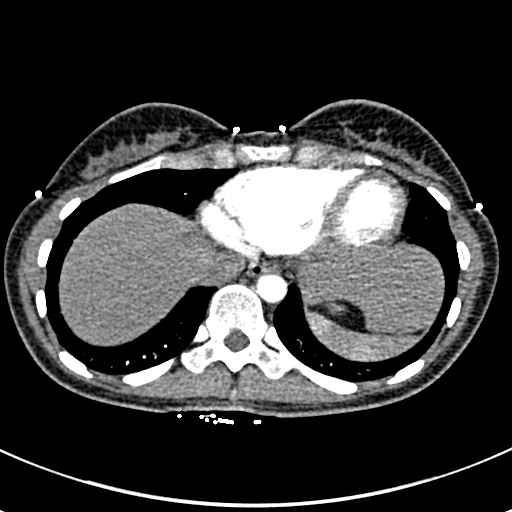
[im 85/281  lung]
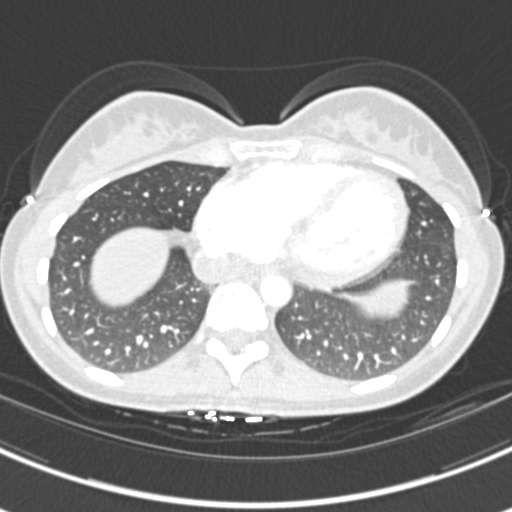
[im 94/281  mediastinal]
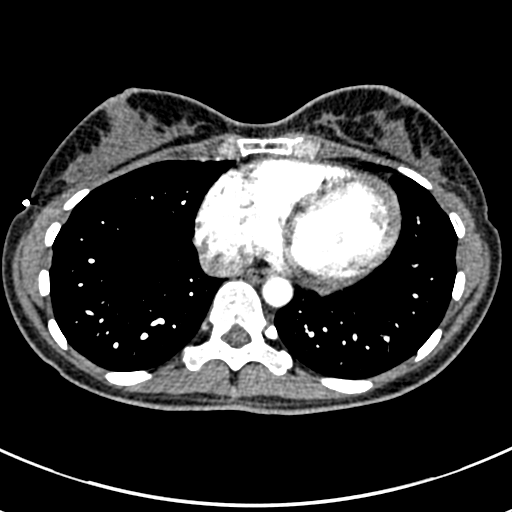
[im 99/281  lung]
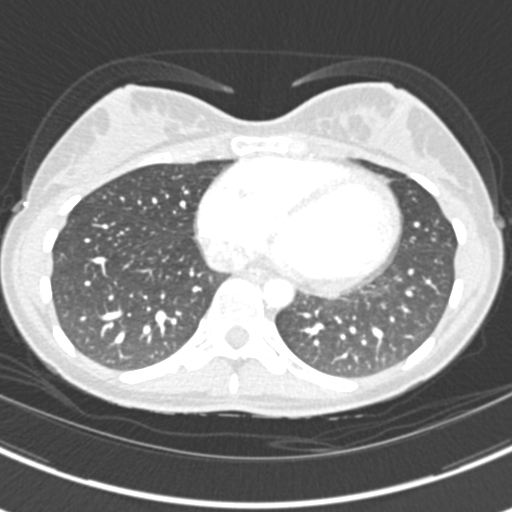
[im 113/281  mediastinal]
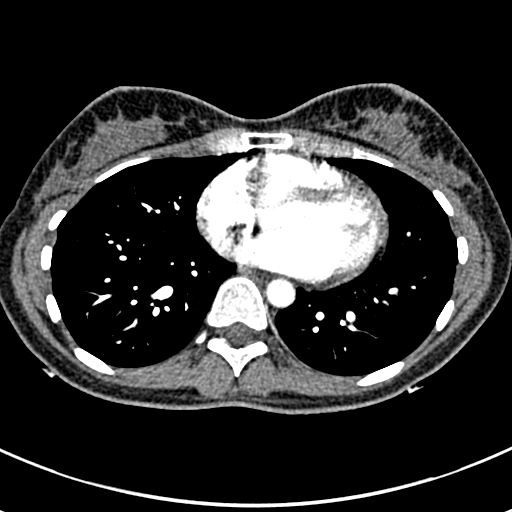
[im 127/281  lung]
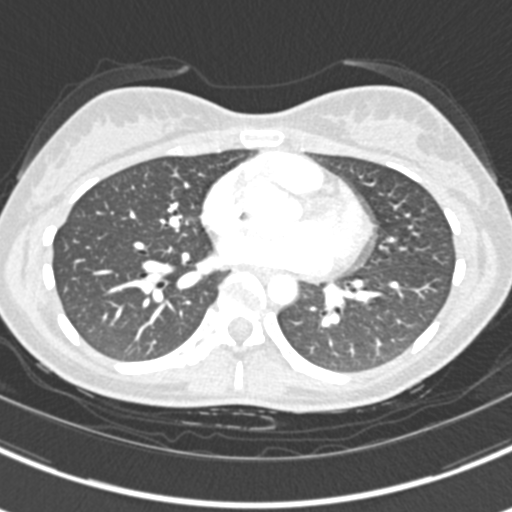
[im 141/281  mediastinal]
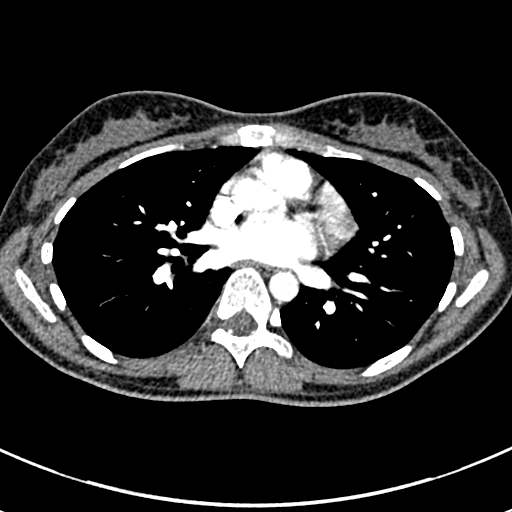
[im 155/281  lung]
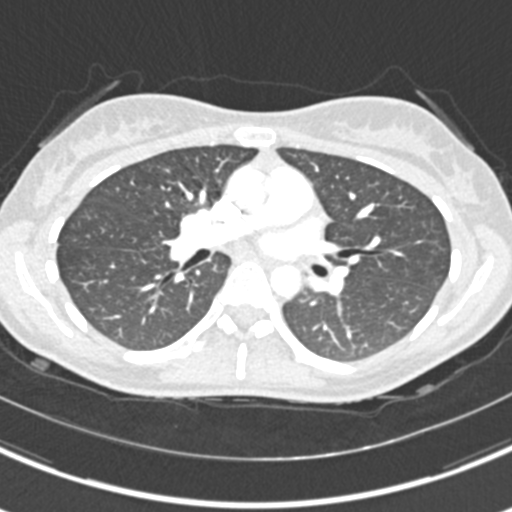
[im 169/281  mediastinal]
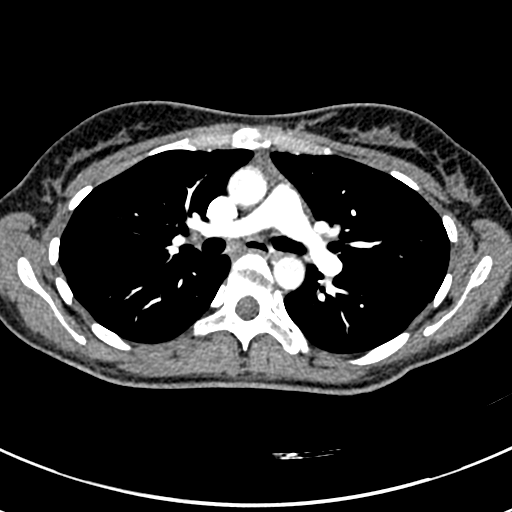
[im 183/281  lung]
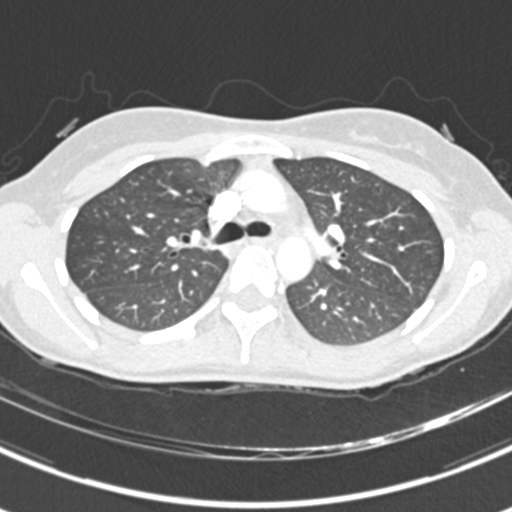
[im 187/281  mediastinal]
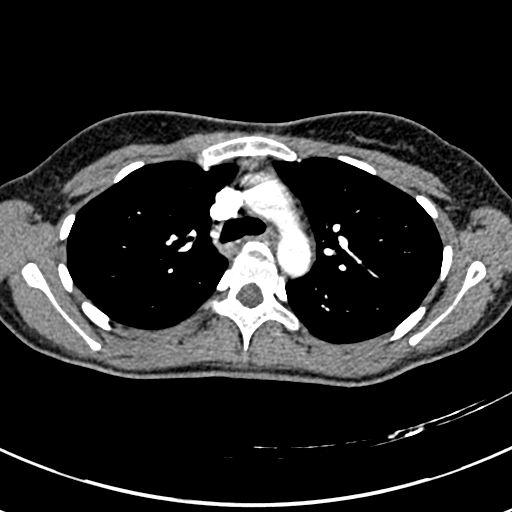
[im 197/281  lung]
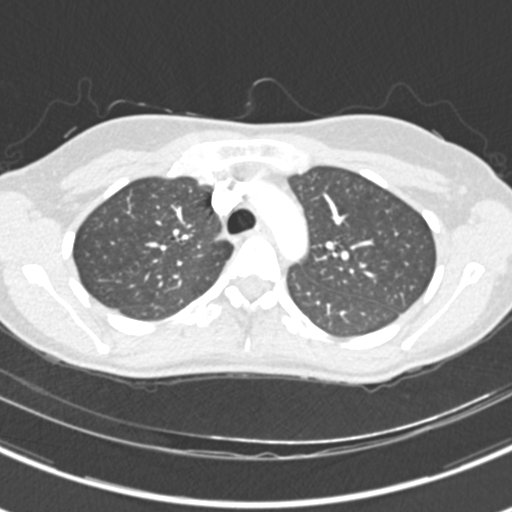
[im 211/281  mediastinal]
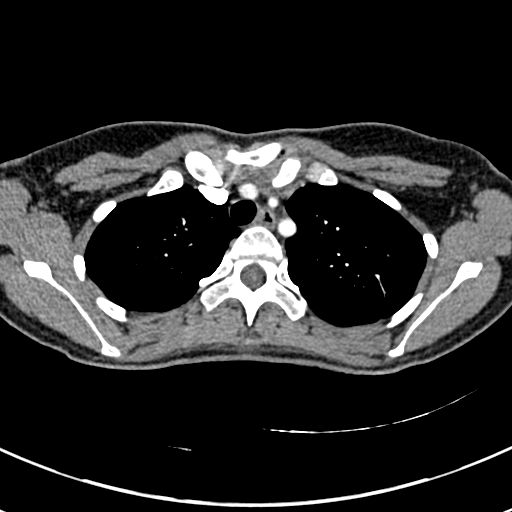
[im 239/281  lung]
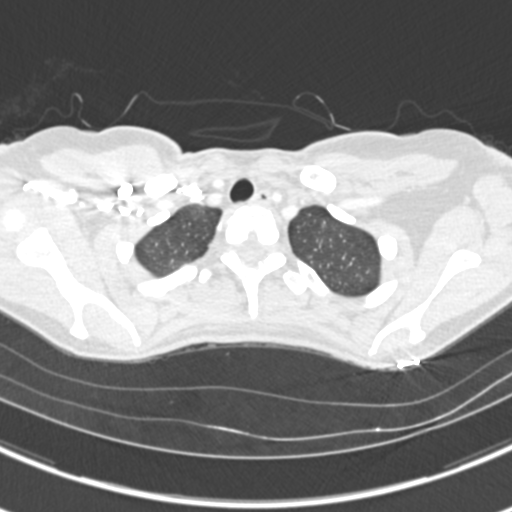
[im 253/281  mediastinal]
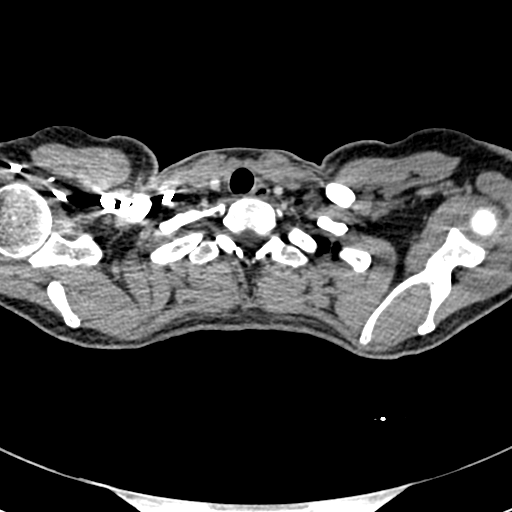
[im 267/281  lung]
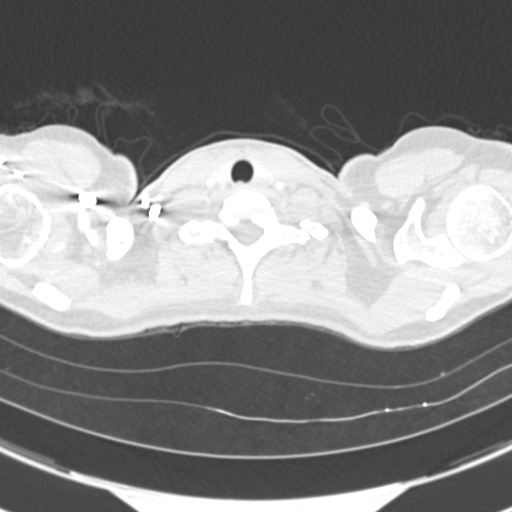

[19 of 32 positions shown; findings below may reference images not displayed]

FINDINGS: Cardiovascular: The quality of this exam for evaluation of pulmonary
embolism is good. No evidence of pulmonary embolism.

Normal aortic caliber. Heart size accentuated by a mild pectus
excavatum deformity.

Mediastinum/Nodes: No mediastinal or hilar adenopathy. Residual
thymic tissue in the anterior mediastinum.

Lungs/Pleura: No pleural fluid.  Clear lungs.

Upper Abdomen: Normal imaged portions of the liver, spleen, stomach,
adrenal glands, pancreas.

Musculoskeletal: No acute osseous abnormality.

Review of the MIP images confirms the above findings.
IMPRESSION: No pulmonary embolism. No acute findings or explanation for
patient's history.

## 2021-04-23 ENCOUNTER — Other Ambulatory Visit: Payer: Self-pay

## 2021-04-23 ENCOUNTER — Ambulatory Visit: Payer: Commercial Managed Care - PPO | Admitting: Adult Health

## 2021-04-23 ENCOUNTER — Encounter: Payer: Self-pay | Admitting: Adult Health

## 2021-04-23 VITALS — BP 114/70 | HR 70 | Temp 98.0°F | Ht 66.0 in | Wt 134.0 lb

## 2021-04-23 DIAGNOSIS — R635 Abnormal weight gain: Secondary | ICD-10-CM

## 2021-04-23 DIAGNOSIS — Z13220 Encounter for screening for lipoid disorders: Secondary | ICD-10-CM | POA: Diagnosis not present

## 2021-04-23 LAB — COMPREHENSIVE METABOLIC PANEL
ALT: 26 U/L (ref 0–35)
AST: 17 U/L (ref 0–37)
Albumin: 4.6 g/dL (ref 3.5–5.2)
Alkaline Phosphatase: 81 U/L (ref 39–117)
BUN: 10 mg/dL (ref 6–23)
CO2: 26 mEq/L (ref 19–32)
Calcium: 8.9 mg/dL (ref 8.4–10.5)
Chloride: 102 mEq/L (ref 96–112)
Creatinine, Ser: 0.74 mg/dL (ref 0.40–1.20)
GFR: 108.49 mL/min (ref >60.00)
Glucose, Bld: 90 mg/dL (ref 70–99)
Potassium: 4.4 mEq/L (ref 3.5–5.1)
Sodium: 138 mEq/L (ref 135–145)
Total Bilirubin: 0.4 mg/dL (ref 0.2–1.2)
Total Protein: 7 g/dL (ref 6.0–8.3)

## 2021-04-23 LAB — CBC WITH DIFFERENTIAL/PLATELET
Basophils Absolute: 0 10*3/uL (ref 0.0–0.1)
Basophils Relative: 0.7 % (ref 0.0–3.0)
Eosinophils Absolute: 0.1 10*3/uL (ref 0.0–0.7)
Eosinophils Relative: 1.7 % (ref 0.0–5.0)
HCT: 38.8 % (ref 36.0–46.0)
Hemoglobin: 13.1 g/dL (ref 12.0–15.0)
Lymphocytes Relative: 41.6 % (ref 12.0–46.0)
Lymphs Abs: 1.7 10*3/uL (ref 0.7–4.0)
MCHC: 33.8 g/dL (ref 30.0–36.0)
MCV: 90 fl (ref 78.0–100.0)
Monocytes Absolute: 0.3 10*3/uL (ref 0.1–1.0)
Monocytes Relative: 7.9 % (ref 3.0–12.0)
Neutro Abs: 2 10*3/uL (ref 1.4–7.7)
Neutrophils Relative %: 48.1 % (ref 43.0–77.0)
Platelets: 226 10*3/uL (ref 150.0–400.0)
RBC: 4.31 Mil/uL (ref 3.87–5.11)
RDW: 13.2 % (ref 11.5–15.5)
WBC: 4.1 10*3/uL (ref 4.0–10.5)

## 2021-04-23 LAB — LIPID PANEL
Cholesterol: 212 mg/dL — ABNORMAL HIGH (ref 0–200)
HDL: 44.1 mg/dL (ref >39.00)
NonHDL: 168.2
Total CHOL/HDL Ratio: 5
Triglycerides: 218 mg/dL — ABNORMAL HIGH (ref 0.0–149.0)
VLDL: 43.6 mg/dL — ABNORMAL HIGH (ref 0.0–40.0)

## 2021-04-23 LAB — TSH: TSH: 3.59 u[IU]/mL (ref 0.35–5.50)

## 2021-04-23 LAB — LDL CHOLESTEROL, DIRECT: Direct LDL: 137 mg/dL

## 2021-04-23 LAB — HCG, QUANTITATIVE, PREGNANCY: Quantitative HCG: <0.6 m[IU]/mL

## 2021-04-23 NOTE — Patient Instructions (Addendum)
Health Maintenance, Female ?Adopting a healthy lifestyle and getting preventive care are important in promoting health and wellness. Ask your health care provider about: ?The right schedule for you to have regular tests and exams. ?Things you can do on your own to prevent diseases and keep yourself healthy. ?What should I know about diet, weight, and exercise? ?Eat a healthy diet ? ?Eat a diet that includes plenty of vegetables, fruits, low-fat dairy products, and lean protein. ?Do not eat a lot of foods that are high in solid fats, added sugars, or sodium. ?Maintain a healthy weight ?Body mass index (BMI) is used to identify weight problems. It estimates body fat based on height and weight. Your health care provider can help determine your BMI and help you achieve or maintain a healthy weight. ?Get regular exercise ?Get regular exercise. This is one of the most important things you can do for your health. Most adults should: ?Exercise for at least 150 minutes each week. The exercise should increase your heart rate and make you sweat (moderate-intensity exercise). ?Do strengthening exercises at least twice a week. This is in addition to the moderate-intensity exercise. ?Spend less time sitting. Even light physical activity can be beneficial. ?Watch cholesterol and blood lipids ?Have your blood tested for lipids and cholesterol at 31 years of age, then have this test every 5 years. ?Have your cholesterol levels checked more often if: ?Your lipid or cholesterol levels are high. ?You are older than 31 years of age. ?You are at high risk for heart disease. ?What should I know about cancer screening? ?Depending on your health history and family history, you may need to have cancer screening at various ages. This may include screening for: ?Breast cancer. ?Cervical cancer. ?Colorectal cancer. ?Skin cancer. ?Lung cancer. ?What should I know about heart disease, diabetes, and high blood pressure? ?Blood pressure and heart  disease ?High blood pressure causes heart disease and increases the risk of stroke. This is more likely to develop in people who have high blood pressure readings or are overweight. ?Have your blood pressure checked: ?Every 3-5 years if you are 19-36 years of age. ?Every year if you are 31 years old or older. ?Diabetes ?Have regular diabetes screenings. This checks your fasting blood sugar level. Have the screening done: ?Once every three years after age 31 if you are at a normal weight and have a low risk for diabetes. ?More often and at a younger age if you are overweight or have a high risk for diabetes. ?What should I know about preventing infection? ?Hepatitis B ?If you have a higher risk for hepatitis B, you should be screened for this virus. Talk with your health care provider to find out if you are at risk for hepatitis B infection. ?Hepatitis C ?Testing is recommended for: ?Everyone born from [birth year] through 1965. ?Anyone with known risk factors for hepatitis C. ?Sexually transmitted infections (STIs) ?Get screened for STIs, including gonorrhea and chlamydia, if: ?You are sexually active and are younger than 31 years of age. ?You are older than 31 years of age and your health care provider tells you that you are at risk for this type of infection. ?Your sexual activity has changed since you were last screened, and you are at increased risk for chlamydia or gonorrhea. Ask your health care provider if you are at risk. ?Ask your health care provider about whether you are at high risk for HIV. Your health care provider may recommend a prescription medicine to help prevent HIV  infection. If you choose to take medicine to prevent HIV, you should first get tested for HIV. You should then be tested every 3 months for as long as you are taking the medicine. ?Pregnancy ?If you are about to stop having your period (premenopausal) and you may become pregnant, seek counseling before you get pregnant. ?Take 400 to 800  micrograms (mcg) of folic acid every day if you become pregnant. ?Ask for birth control (contraception) if you want to prevent pregnancy. ?Osteoporosis and menopause ?Osteoporosis is a disease in which the bones lose minerals and strength with aging. This can result in bone fractures. If you are 72 years old or older, or if you are at risk for osteoporosis and fractures, ask your health care provider if you should: ?Be screened for bone loss. ?Take a calcium or vitamin D supplement to lower your risk of fractures. ?Be given hormone replacement therapy (HRT) to treat symptoms of menopause. ?Follow these instructions at home: ?Alcohol use ?Do not drink alcohol if: ?Your health care provider tells you not to drink. ?You are pregnant, may be pregnant, or are planning to become pregnant. ?If you drink alcohol: ?Limit how much you have to: ?0-1 drink a day. ?Know how much alcohol is in your drink. In the [place] one drink equals one 12 oz bottle of beer (355 mL), one 5 oz glass of wine (148 mL), or one 1? oz glass of hard liquor (44 mL). ?Lifestyle ?Do not use any products that contain nicotine or tobacco. These products include cigarettes, chewing tobacco, and vaping devices, such as e-cigarettes. If you need help quitting, ask your health care provider. ?Do not use street drugs. ?Do not share needles. ?Ask your health care provider for help if you need support or information about quitting drugs. ?General instructions ?Schedule regular health, dental, and eye exams. ?Stay current with your vaccines. ?Tell your health care provider if: ?You often feel depressed. ?You have ever been abused or do not feel safe at home. ?Summary ?Adopting a healthy lifestyle and getting preventive care are important in promoting health and wellness. ?Follow your health care provider's instructions about healthy diet, exercising, and getting tested or screened for diseases. ?Follow your health care provider's instructions on monitoring your  cholesterol and blood pressure. ?This information is not intended to replace advice given to you by your health care provider. Make sure you discuss any questions you have with your health care provider. ?Document Revised: 06/11/2020 Document Reviewed: 06/11/2020 ?Elsevier Patient Education ? [place]. ?High-Protein and High-Calorie Diet ?Eating high-protein and high-calorie foods can help you to gain weight, heal after an injury, and recover after an illness or surgery. The specific amount of daily protein and calories you need depends on: ?Your body weight. ?The reason this diet is recommended for you. ?Generally, a high-protein, high-calorie diet involves: ?Eating 250-500 extra calories each day. ?Making sure that you get enough of your daily calories from protein. Ask your health care provider how many of your calories should come from protein. ?Talk with a health care provider or a dietitian about how much protein and how many calories you need each day. Follow the diet as directed by your health care provider. ?What are tips for following this plan? ?Reading food labels ?Check the nutrition facts label for calories, grams of fat and protein. Items with more than 4 grams of protein are high-protein foods. ?Preparing meals ?Add whole milk, half-and-half, or heavy cream to cereal, pudding, soup, or hot cocoa. ?Add whole  milk to instant breakfast drinks. ?Add peanut butter to oatmeal or smoothies. ?Add powdered milk to baked goods, smoothies, or milkshakes. ?Add powdered milk, cream, or butter to mashed potatoes. ?Add cheese to cooked vegetables. ?Make whole-milk yogurt parfaits. Top them with granola, fruit, or nuts. ?Add cottage cheese to fruit. ?Add avocado, cheese, or both to sandwiches or salads. Add avocado to smoothies. ?Add meat, poultry, or seafood to rice, pasta, casseroles, salads, and soups. ?Use mayonnaise when making egg salad, chicken salad, or tuna salad. ?Use peanut butter as a dip for  fruits and vegetables or as a topping for pretzels, celery, or crackers. ?Add beans to casseroles, dips, and spreads. ?Add pureed beans to sauces and soups. ?Replace calorie-free drinks with calorie-containing

## 2021-04-23 NOTE — Progress Notes (Signed)
? ?Acute Office Visit ? ?Subjective:  ? ? Patient ID: Valerie Gardner, female    DOB: 1990/10/07, 31 y.o.   MRN: [id number] ? ?Chief Complaint  ?Patient presents with  ? Follow-up  ?  F/u to discuss weight gain  ? ? ?HPI ?Patient is in today for issues with weight.  ?Denies any edema, or orthopnea.  ?LMP 04/12/2021 she has amenorrhea since prior to March since september.  ?This period is regular - denies any pregnancy. Not breastfeeding.  ? ?Scales at home showed 170 lbs at home. She is not exercising other than trying to keep up with her 2 little children at home. Is trying to cook more at home.  ? ?History of SVT on metoprolol, has improved with metoprolol. She did cancel the cardiology appointment with them one month ago. Feels fine.  ?Denies  any syncope.  ? ?Patient  denies any fever, body aches,chills, rash, chest pain, shortness of breath, nausea, vomiting, or diarrhea.  ?Denies dizziness, lightheadedness, pre syncopal or syncopal episodes.  ? ? ? ? ?Past Surgical History:  ?Procedure Laterality Date  ? NO PAST SURGERIES    ? ? ?Family History  ?Problem Relation Age of Onset  ? Diabetes Mellitus I Sister   ? Melanoma Maternal Grandmother   ? Diabetes Mellitus II Paternal Grandmother   ? COPD Paternal Grandmother   ? Diabetes Paternal Grandmother   ? Hypertension Mother   ? Hypertension Father   ? Brain cancer Paternal Grandfather   ? ? ?Social History  ? ?Socioeconomic History  ? Marital status: Married  ?  Spouse name: [name]  ? Number of children: 2  ? Years of education: 52  ? Highest education level: Not on file  ?Occupational History  ? Occupation: STAY AT HOME MOM  ?Tobacco Use  ? Smoking status: Former  ? Smokeless tobacco: Never  ?Vaping Use  ? Vaping Use: Never used  ?Substance and Sexual Activity  ? Alcohol use: No  ? Drug use: No  ? Sexual activity: Yes  ?  Birth control/protection: None  ?Other Topics Concern  ? Not on file  ?Social History Narrative  ? Lives with husband and 3 children  ?  Right Handed  ? Drinks very rarely caffeine  ? ?Social Determinants of Health  ? ?Financial Resource Strain: Not on file  ?Food Insecurity: Not on file  ?Transportation Needs: Not on file  ?Physical Activity: Not on file  ?Stress: Not on file  ?Social Connections: Not on file  ?Intimate Partner Violence: Not on file  ? ? ?Outpatient Medications Prior to Visit  ?Medication Sig Dispense Refill  ? butalbital-acetaminophen-caffeine (FIORICET) 50-325-40 MG tablet Take 1 tablet by mouth 2 (two) times daily as needed for headache. 30 tablet 0  ? hydrOXYzine (ATARAX/VISTARIL) 25 MG tablet TAKE 1-2 TABLETS (25-50 MG TOTAL) BY MOUTH 3 (THREE) TIMES DAILY. 540 tablet 0  ? metoprolol succinate (TOPROL-XL) 25 MG 24 hr tablet Take 0.5 tablets (12.5 mg total) by mouth 2 (two) times daily. 90 tablet 2  ? ondansetron (ZOFRAN) 4 MG tablet Take 1 tablet (4 mg total) by mouth every 8 (eight) hours as needed for nausea or vomiting. 20 tablet 0  ? ?No facility-administered medications prior to visit.  ? ? ?No Known Allergies ? ?Review of Systems  ?Constitutional:  Positive for unexpected weight change. Negative for activity change, appetite change, chills, diaphoresis, fatigue and fever.  ?HENT: Negative.    ?Eyes: Negative.   ?Respiratory: Negative.    ?Cardiovascular:  Negative.   ?Gastrointestinal: Negative.   ?Endocrine: Negative.   ?Genitourinary:  Positive for menstrual problem. Negative for decreased urine volume, difficulty urinating, dyspareunia, dysuria, enuresis, flank pain, frequency, genital sores, hematuria, pelvic pain, urgency, vaginal bleeding, vaginal discharge and vaginal pain.  ?Musculoskeletal: Negative.   ?Skin: Negative.   ?Neurological: Negative.   ?Hematological: Negative.   ?Psychiatric/Behavioral: Negative.    ? ?   ?Objective:  ?  ?Physical Exam ? ?BP 114/70 (BP Location: Left Arm, Patient Position: Sitting, Cuff Size: Small)   Pulse 70   Temp 98 ?F (36.7 ?C) (Oral)   Ht '5\' 6"'$  (1.676 m)   Wt 134 lb (60.8  kg)   SpO2 98%   BMI 21.63 kg/m?  ?Wt Readings from Last 3 Encounters:  ?04/23/21 134 lb (60.8 kg)  ?04/27/20 134 lb (60.8 kg)  ?04/26/20 133 lb (60.3 kg)  ? ?General: Appearance:    Well developed, well nourished female in no acute distress  ?Eyes:    PERRL, conjunctiva/corneas clear, EOM's intact       ?Lungs:     Clear to auscultation bilaterally, respirations unlabored  ?Heart:    Normal heart rate. Normal rhythm. No murmurs, rubs, or gallops.  ?  ?MS:   All extremities are intact.  ?  ?Neurologic:   Awake, alert, oriented x 3. No apparent focal neurological           defect.   ? Abdomen: soft and non-tender without masses, organomegaly or hernias noted.  No guarding or rebound  ?There are no preventive care reminders to display for this patient. ? ?There are no preventive care reminders to display for this patient. ? ? ?Lab Results  ?Component Value Date  ? TSH 3.59 04/23/2021  ? ?Lab Results  ?Component Value Date  ? WBC 4.1 04/23/2021  ? HGB 13.1 04/23/2021  ? HCT 38.8 04/23/2021  ? MCV 90.0 04/23/2021  ? PLT 226.0 04/23/2021  ? ?Lab Results  ?Component Value Date  ? NA 138 04/23/2021  ? K 4.4 04/23/2021  ? CO2 26 04/23/2021  ? GLUCOSE 90 04/23/2021  ? BUN 10 04/23/2021  ? CREATININE 0.74 04/23/2021  ? BILITOT 0.4 04/23/2021  ? ALKPHOS 81 04/23/2021  ? AST 17 04/23/2021  ? ALT 26 04/23/2021  ? PROT 7.0 04/23/2021  ? ALBUMIN 4.6 04/23/2021  ? CALCIUM 8.9 04/23/2021  ? ANIONGAP 10 03/13/2018  ? GFR 108.49 04/23/2021  ? ?Lab Results  ?Component Value Date  ? CHOL 212 (H) 04/23/2021  ? ?Lab Results  ?Component Value Date  ? HDL 44.10 04/23/2021  ? ?No results found for: [place] ?Lab Results  ?Component Value Date  ? TRIG 218.0 (H) 04/23/2021  ? ?Lab Results  ?Component Value Date  ? CHOLHDL 5 04/23/2021  ? ?Lab Results  ?Component Value Date  ? HGBA1C 5.0 03/06/2020  ? ? ?  04/23/2021  ?  3:07 PM 03/06/2020  ? 10:34 AM 03/06/2020  ? 10:33 AM  ?Depression screen PHQ 2/9  ?Decreased Interest 0 0 0  ?Down, Depressed,  Hopeless 0 0 0  ?PHQ - 2 Score 0 0 0  ?Altered sleeping  3   ?Tired, decreased energy  3   ?Change in appetite  3   ?Feeling bad or failure about yourself   0   ?Trouble concentrating  0   ?Moving slowly or fidgety/restless  0   ?Suicidal thoughts  0   ?PHQ-9 Score  9   ?Difficult doing work/chores  Extremely dIfficult   ? ? ?  [facility] Visit from 04/23/2021 in [facility]  ?PHQ-2 Total Score 0  ? ?  ?  ? ?  04/23/2021  ?  3:07 PM  ?GAD 7 : Generalized Anxiety Score  ?Nervous, Anxious, on Edge 1  ?Control/stop worrying 1  ?Worry too much - different things 0  ?Trouble relaxing 1  ?Restless 0  ?Easily annoyed or irritable 1  ?Afraid - awful might happen 0  ?Total GAD 7 Score 4  ?Anxiety Difficulty Not difficult at all  ? ? ?  ?Assessment & Plan:  ? ?Problem List Items Addressed This Visit   ? ?  ? Other  ? Screening cholesterol level  ? Relevant Orders  ? LDL cholesterol, direct (Completed)  ? Weight gain - Primary  ? Relevant Orders  ? TSH (Completed)  ? CBC with Differential/Platelet (Completed)  ? Comprehensive metabolic panel (Completed)  ? hCG, quantitative, pregnancy (Completed)  ? Lipid panel (Completed)  ? Parathyroid hormone, intact (no Ca) (Completed)  ?She will follow up with gynecology she is established with as well for irregular menses.  ?Labs today.  ?Orders Placed This Encounter  ?Procedures  ? TSH  ? CBC with Differential/Platelet  ? Comprehensive metabolic panel  ? hCG, quantitative, pregnancy  ? Lipid panel  ? Parathyroid hormone, intact (no Ca)  ? LDL cholesterol, direct  ?  ?The patient is advised to begin progressive daily aerobic exercise program, follow a low fat, low cholesterol diet, attempt to lose weight, reduce salt in diet and cooking, reduce exposure to stress, improve dietary compliance, use calcium 1 gram daily with Vit D, continue current medications, continue current healthy lifestyle patterns, and return for routine annual checkups.  ?No orders of the  defined types were placed in this encounter. ? ?Red Flags discussed. The patient was given clear instructions to go to ER or return to medical center if any red flags develop, symptoms do not improve, worsen or new

## 2021-04-24 ENCOUNTER — Telehealth: Payer: Self-pay

## 2021-04-24 LAB — PARATHYROID HORMONE, INTACT (NO CA): PTH: 24 pg/mL (ref 16–77)

## 2021-04-24 NOTE — Progress Notes (Signed)
PTH- parathyroid level - is within normal limits

## 2021-04-24 NOTE — Telephone Encounter (Signed)
Mychart msg sent

## 2021-04-24 NOTE — Progress Notes (Signed)
Total cholesterol, triglycerides  and LDL elevated.  Discuss lifestyle modification with patient e.g. increase exercise, fiber, fruits, vegetables, lean meat, and omega 3/fish intake and decrease saturated fat.  If patient following strict diet and exercise program already please schedule follow up appointment with primary care physician  ?LDL borderline high. ?HCG negative pregnancy.  ?TSH, CBC, CMP, TSH within normal limits.  ?No cause for weight gain, establish care with another provider since my last day is 04/25/21 is advised and follow up.

## 2021-05-08 IMAGING — CR DG LUMBAR SPINE COMPLETE 4+V
1 series · 6 of 6 positions shown · non-contrast
Comparison: None.

CLINICAL DATA: Left lumbar back pain.  Pain for 2 years.

EXAM:
LUMBAR SPINE - COMPLETE 4+ VIEW

[Series 1: dg lumbar spine complete 4 +v · 0.14mm/px · 6 of 6 slices shown]
[im 1/6]
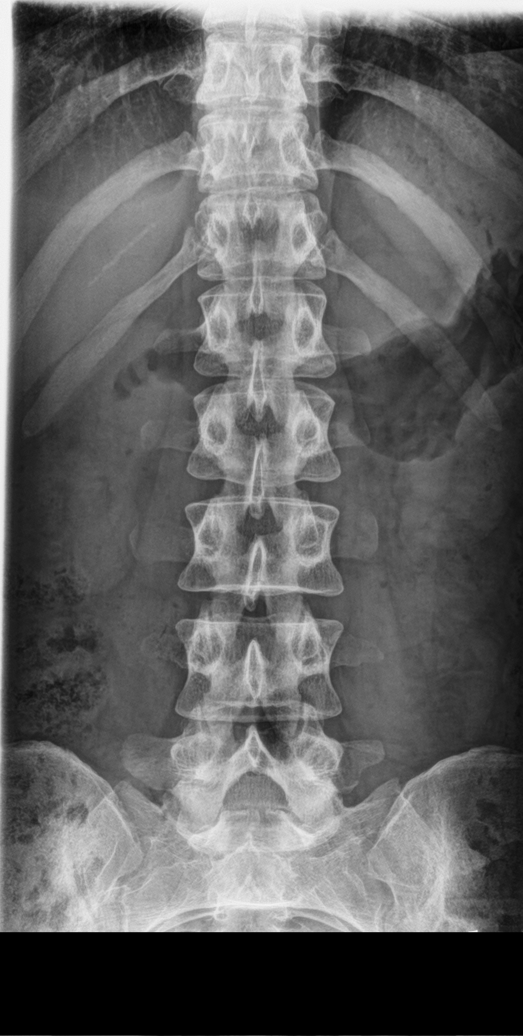
[im 2/6]
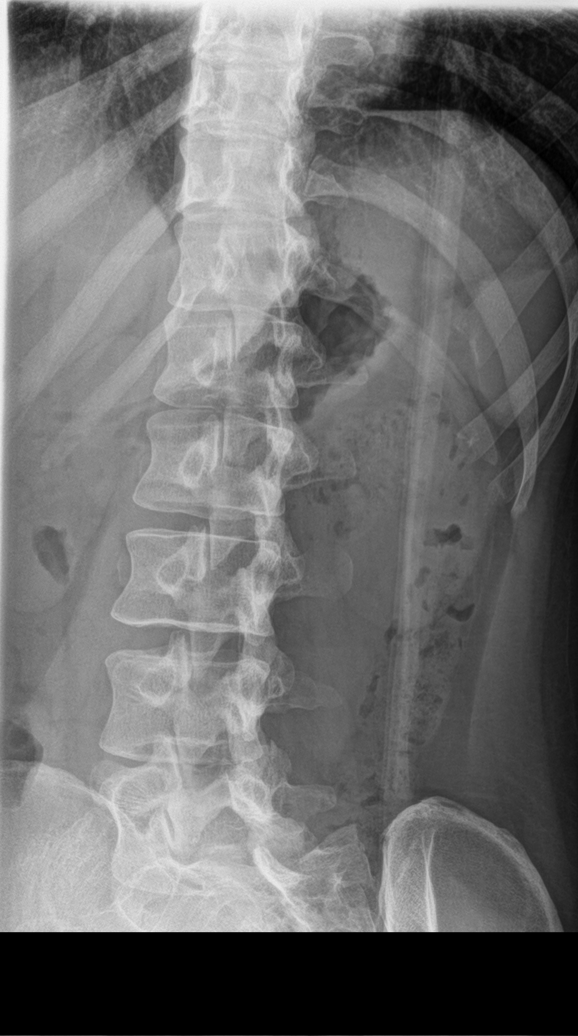
[im 3/6]
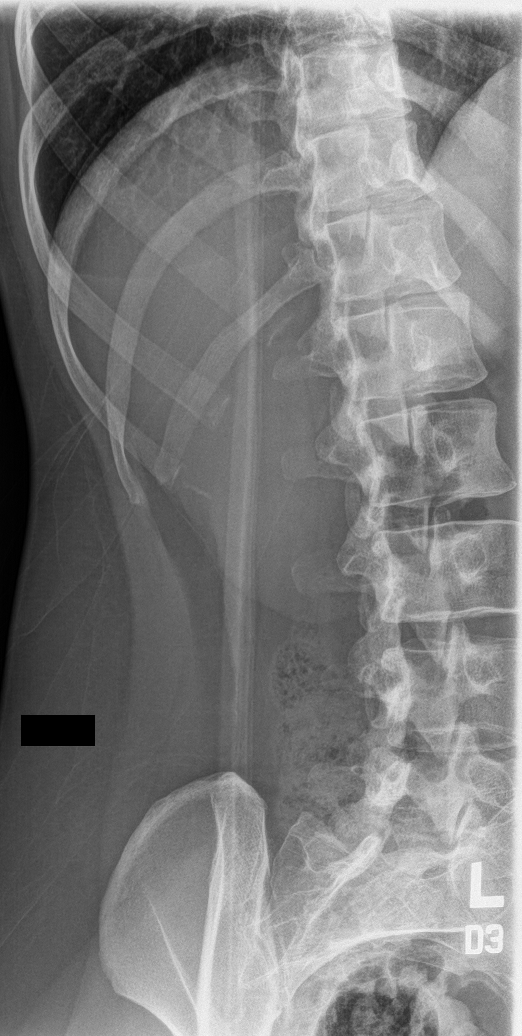
[im 4/6]
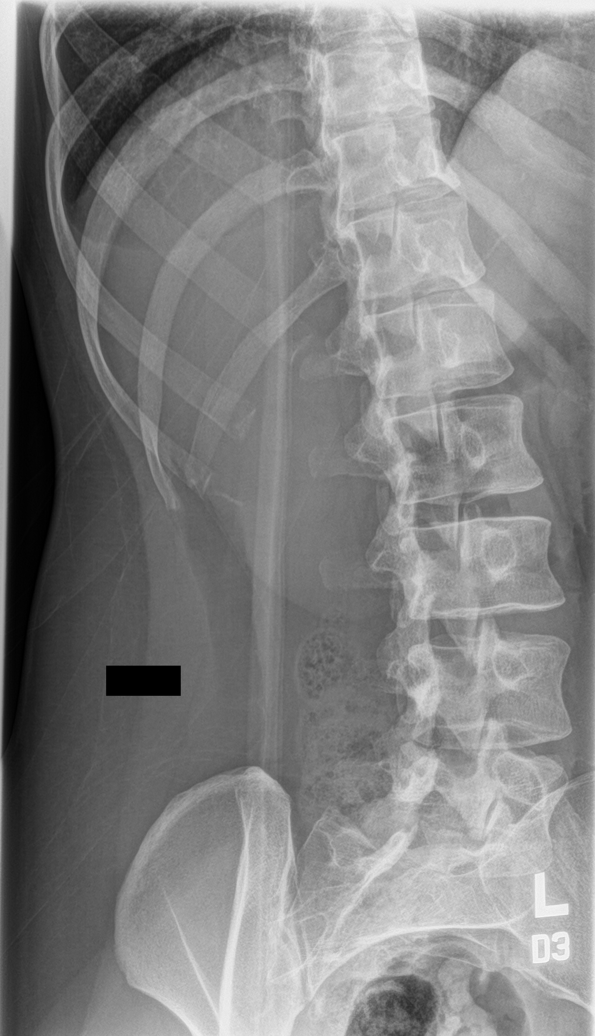
[im 5/6]
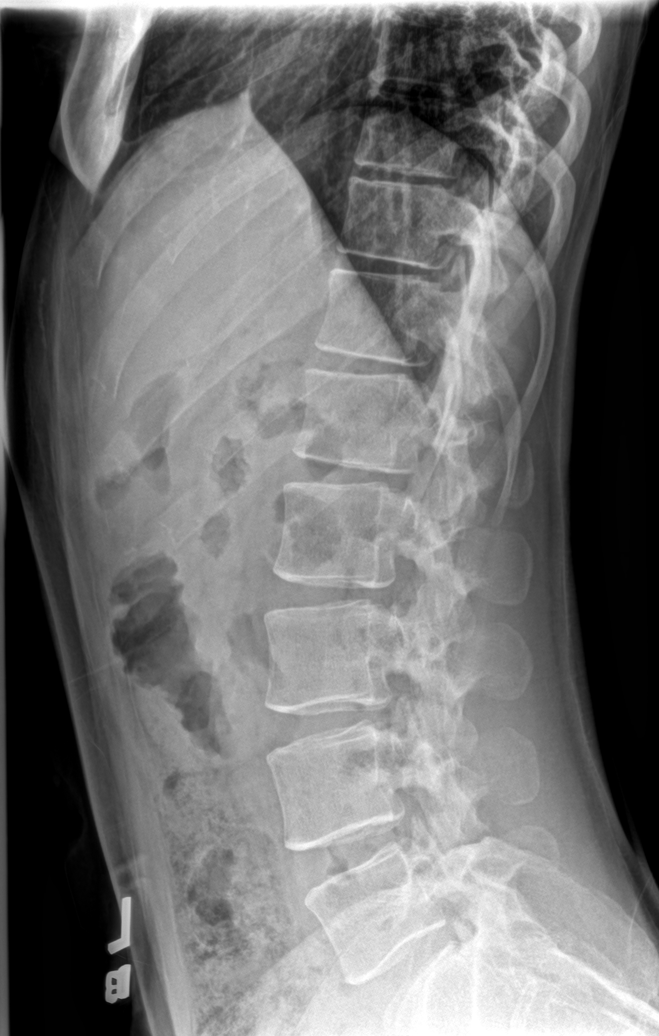
[im 6/6]
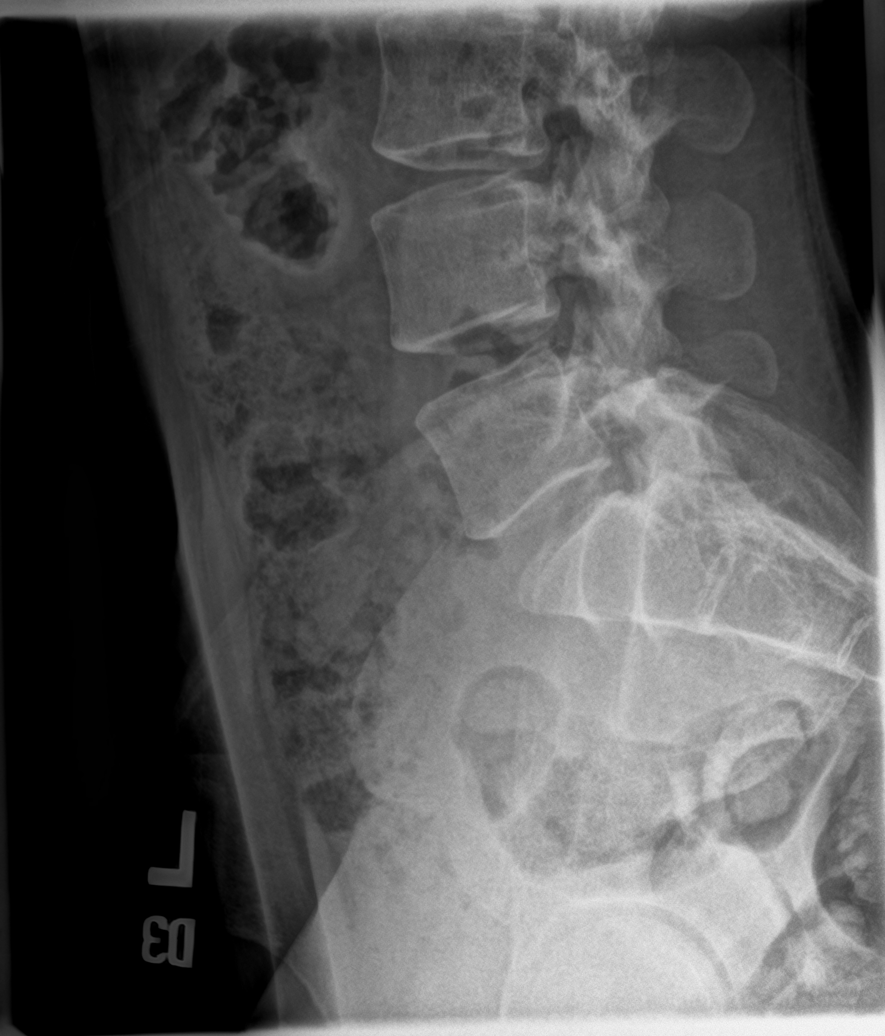

[6 of 6 positions shown; findings below may reference images not displayed]

FINDINGS: AP, lateral, bilateral oblique, and lateral L5 spot views obtained.
There are 5 non-rib-bearing lumbar vertebra. The alignment is
maintained. Vertebral body heights are normal. There is no
listhesis. The posterior elements are intact. Disc spaces are
preserved. No fracture, focal bone lesion or bone destruction.
Sacroiliac joints are symmetric and normal.
IMPRESSION: Negative radiographs of the lumbar spine.

## 2021-05-26 ENCOUNTER — Encounter: Payer: Self-pay | Admitting: Family

## 2021-05-29 ENCOUNTER — Telehealth: Payer: Self-pay | Admitting: Adult Health

## 2021-05-29 NOTE — Telephone Encounter (Signed)
Patient is a former [facility] patient and needs her butalbital-acetaminophen-caffeine (FIORICET) 50-325-40 MG tablet refilled, she is out. Patient has been scheduled for 06/19/2021 for a TOC with [facility]. ?

## 2021-05-31 MED ORDER — BUTALBITAL-APAP-CAFFEINE 50-325-40 MG PO TABS: 1.0000 | ORAL_TABLET | Freq: Two times a day (BID) | ORAL | 0 refills | Status: DC | PRN

## 2021-05-31 NOTE — Telephone Encounter (Signed)
Refilled Fioricet ?She must keep appt ? ?noncompliant ? ?

## 2021-05-31 NOTE — Addendum Note (Signed)
Addended by: Burnard [facility] on: 05/31/2021 01:40 PM ? ? Modules accepted: Orders ? ?

## 2021-06-19 ENCOUNTER — Encounter: Payer: Self-pay | Admitting: Family

## 2021-06-19 ENCOUNTER — Ambulatory Visit: Payer: Commercial Managed Care - PPO | Admitting: Family

## 2021-06-19 VITALS — BP 118/70 | HR 98 | Temp 98.7°F | Ht 66.0 in | Wt 167.2 lb

## 2021-06-19 DIAGNOSIS — R519 Headache, unspecified: Secondary | ICD-10-CM | POA: Insufficient documentation

## 2021-06-19 DIAGNOSIS — R42 Dizziness and giddiness: Secondary | ICD-10-CM

## 2021-06-19 DIAGNOSIS — M545 Low back pain, unspecified: Secondary | ICD-10-CM | POA: Diagnosis not present

## 2021-06-19 DIAGNOSIS — R55 Syncope and collapse: Secondary | ICD-10-CM

## 2021-06-19 DIAGNOSIS — I471 Supraventricular tachycardia: Secondary | ICD-10-CM | POA: Diagnosis not present

## 2021-06-19 DIAGNOSIS — G8929 Other chronic pain: Secondary | ICD-10-CM

## 2021-06-19 MED ORDER — DULOXETINE HCL 30 MG PO CPEP: 30.0000 mg | ORAL_CAPSULE | ORAL | 0 refills | Status: DC

## 2021-06-19 MED ORDER — CYCLOBENZAPRINE HCL 10 MG PO TABS: 10.0000 mg | ORAL_TABLET | Freq: Every day | ORAL | 2 refills | Status: DC | PRN

## 2021-06-19 MED ORDER — MAGNESIUM CITRATE 200 MG PO TABS: 400.0000 mg | ORAL_TABLET | Freq: Every day | ORAL | 1 refills | Status: AC

## 2021-06-19 NOTE — Assessment & Plan Note (Addendum)
Acute on chronic low back pain, benign MS exam.   Reviewed x-ray cervical, thoracic and lumbar spine with patient today.  We did discuss  dextroscoliotic curvature of the lower thoracic spine likely contributing to chronic symptoms.  She also has young children and lifting them quite often.  I advised physical therapy and due to time constraints, she was more inclined to do exercises at home which I have provided.  We have opted to trial Cymbalta 30 mg to see if pain would improve.  I have refilled Flexeril 10 mg as needed daily.  Close follow up.  ?

## 2021-06-19 NOTE — Progress Notes (Signed)
? ?Subjective:  ? ? Patient ID: Valerie Gardner, female    DOB: [birth year], 31 y.o.   MRN: [id number] ? ?CC: Valerie Gardner is a 31 y.o. female who presents today for an acute visit and transfer of care.  ? ?HPI: Complains of acute on chronic left sided low back pain off and on x 3 years.  ?Pain has been absent until the past year.  ?Pain radiates to left side of abdomen. Pain is not associated.  ?Aggrevated by long periods of standing or picking up children No injury. ?She ran out of flexeril and would like refill as medication ?No numbness, urinary frequency, hematuria, constipation,  ? ?Back pain is 'random' ? ?LMP 05/15/21 ?H/o pcos and menses can be irregular.  ? ? ? ?Headache- She feels tension top of head 'squeezing.' HA started year and half. HA are 2-3 times per week. No HA today.  HA are unchanged from prior.  ?No photophobia, vomiting, nausea, vision change, vision loss.  ?HA are not positional, exertional.  ?Takes peppermint fioricet with relief.  Toprol has not appeared to be helpful for headache. ?Dizziness has decreased significantly.She will noticed intermittently if outside on hot day or after a hot shower. No syncope, sob  ? ?SVT - occasional nonpainful, nonsustained palpitations when she goes from sitting to standing. She is compliant with toprol 12.'5mg'$  BID ? ?Denies depression, anxiety.  ? ?Previously seen 04/26/2020 for postural dizziness with near syncope, Dr. Caryl Comes.  He suggested a referral to ENT and/or reevaluation by neurology.  Previously seen by [facility] neurology, 03/09/20 for dizziness.  ?MRI 03/20/2020 obtained, normal ?Consult with ENT, Dr Richardson Landry, 05/22/20 whom ordered VNG, audiogram ( unable to see results).  ? ?Cervical , thoracic and lumbar xray 03/28/2020 ?Showed reversal of normal lordosis, negative radiographs of lumbar spine ?Slight dextro scoliotic curvature of the lower thoracic spine ?HISTORY:  ?Past Medical History:  ?Diagnosis Date  ?? [redacted] weeks gestation of pregnancy  04/02/2017  ?? Anxiety   ?? PCOS (polycystic ovarian syndrome)   ?? Supraventricular tachycardia ([place])   ? ?Past Surgical History:  ?Procedure Laterality Date  ?? NO PAST SURGERIES    ? ?Family History  ?Problem Relation Age of Onset  ?? Hypertension Mother   ?? Hypertension Father   ?? Diabetes Mellitus I Sister   ?? Clotting disorder Sister   ?     blood clot in spine after covid  ?? Melanoma Maternal Grandmother   ?? Diabetes Mellitus II Paternal Grandmother   ?? COPD Paternal Grandmother   ?? Diabetes Paternal Grandmother   ?? Brain cancer Paternal Grandfather   ? ? ?Allergies: Patient has no known allergies. ?Current Outpatient Medications on File Prior to Visit  ?Medication Sig Dispense Refill  ?? butalbital-acetaminophen-caffeine (FIORICET) 50-325-40 MG tablet Take 1 tablet by mouth 2 (two) times daily as needed for headache. 30 tablet 0  ?? hydrOXYzine (ATARAX/VISTARIL) 25 MG tablet TAKE 1-2 TABLETS (25-50 MG TOTAL) BY MOUTH 3 (THREE) TIMES DAILY. 540 tablet 0  ?? metoprolol succinate (TOPROL-XL) 25 MG 24 hr tablet Take 0.5 tablets (12.5 mg total) by mouth 2 (two) times daily. 90 tablet 2  ?? ondansetron (ZOFRAN) 4 MG tablet Take 1 tablet (4 mg total) by mouth every 8 (eight) hours as needed for nausea or vomiting. 20 tablet 0  ? ?No current facility-administered medications on file prior to visit.  ? ? ?Social History  ? ?Tobacco Use  ?? Smoking status: Former  ?? Smokeless tobacco: Never  ?Vaping Use  ??  Vaping Use: Never used  ?Substance Use Topics  ?? Alcohol use: No  ?? Drug use: No  ? ? ?Review of Systems  ?Constitutional:  Negative for chills and fever.  ?Eyes:  Negative for visual disturbance.  ?Respiratory:  Negative for cough.   ?Cardiovascular:  Negative for chest pain and palpitations.  ?Gastrointestinal:  Negative for nausea and vomiting.  ?Musculoskeletal:  Positive for back pain.  ?Neurological:  Positive for dizziness and headaches. Negative for tremors, weakness and numbness.   ?Psychiatric/Behavioral:  The patient is not nervous/anxious.   ?   ?Objective:  ?  ?BP 118/70 (BP Location: Left Arm, Patient Position: Sitting, Cuff Size: Large)   Pulse 98   Temp 98.7 ?F (37.1 ?C) (Oral)   Ht '5\' 6"'$  (1.676 m)   Wt 167 lb 3.2 oz (75.8 kg)   LMP  (LMP Unknown)   SpO2 98%   BMI 26.99 kg/m?  ? ? ?Physical Exam ?Vitals reviewed.  ?Constitutional:   ?   Appearance: She is well-developed.  ?HENT:  ?   Mouth/Throat:  ?   Pharynx: Uvula midline.  ?Eyes:  ?   Conjunctiva/sclera: Conjunctivae normal.  ?   Pupils: Pupils are equal, round, and reactive to light.  ?   Comments: Fundus normal bilaterally.   ?Cardiovascular:  ?   Rate and Rhythm: Normal rate and regular rhythm.  ?   Pulses: Normal pulses.  ?   Heart sounds: Normal heart sounds.  ?Pulmonary:  ?   Effort: Pulmonary effort is normal.  ?   Breath sounds: Normal breath sounds. No wheezing, rhonchi or rales.  ?Abdominal:  ?   Tenderness: There is no abdominal tenderness.  ?Musculoskeletal:  ?   Lumbar back: No swelling, edema, spasms, tenderness or bony tenderness. Normal range of motion.  ?   Comments: Full range of motion with flexion, tension, lateral side bends. No bony tenderness. ?No pain, numbness, tingling elicited with single leg raise bilaterally.   ?Skin: ?   General: Skin is warm and dry.  ?Neurological:  ?   Mental Status: She is alert.  ?   Cranial Nerves: No cranial nerve deficit.  ?   Sensory: No sensory deficit.  ?   Deep Tendon Reflexes:  ?   Reflex Scores: ?     Bicep reflexes are 2+ on the right side and 2+ on the left side. ?     Patellar reflexes are 2+ on the right side and 2+ on the left side. ?   Comments: Grip equal and strong bilateral upper extremities. Gait strong and steady. Able to perform rapid alternating movement without difficulty.   ?Psychiatric:     ?   Speech: Speech normal.     ?   Behavior: Behavior normal.     ?   Thought Content: Thought content normal.  ? ? ?   ?Assessment & Plan:  ? ?Problem List  Items Addressed This Visit   ? ?  ? Cardiovascular and Mediastinum  ? Postural dizziness with near syncope  ?  Improved and not particularly bothersome for patient at this time.  Reviewed previous work-up.  We will continue monitor closely.  Referral to neurology again if symptom recurs. ? ?  ?  ? SVT (supraventricular tachycardia) ([place])  ?  Chronic,symptomatically stable.  Continue Toprol 12.5 mg twice daily ? ?  ?  ?  ? Other  ? Chronic left-sided low back pain without sciatica - Primary  ?  Acute on chronic low back  pain, benign MS exam.   Reviewed x-ray cervical, thoracic and lumbar spine with patient today.  We did discuss  dextroscoliotic curvature of the lower thoracic spine likely contributing to chronic symptoms.  She also has young children and lifting them quite often.  I advised physical therapy and due to time constraints, she was more inclined to do exercises at home which I have provided.  We have opted to trial Cymbalta 30 mg to see if pain would improve.  I have refilled Flexeril 10 mg as needed daily.  Close follow up.  ? ?  ?  ? Relevant Medications  ? cyclobenzaprine (FLEXERIL) 10 MG tablet  ? DULoxetine (CYMBALTA) 30 MG capsule  ? Headache  ?  Reassuring neurologic exam today.  No alarm features at this time.  We have opted to start magnesium citrate 400 mg daily as preventative medication.  She will continue to use Fioricet for rescue.  Controlled substance contract has been completed.  Close follow-up in 4 weeks time ? ?  ?  ? Relevant Medications  ? cyclobenzaprine (FLEXERIL) 10 MG tablet  ? Magnesium Citrate 200 MG TABS  ? DULoxetine (CYMBALTA) 30 MG capsule  ? ? ? ? ? ?I am having Valerie Gardner start on cyclobenzaprine, Magnesium Citrate, and DULoxetine. I am also having her maintain her metoprolol succinate, ondansetron, hydrOXYzine, and butalbital-acetaminophen-caffeine. ? ? ?Meds ordered this encounter  ?Medications  ?? cyclobenzaprine (FLEXERIL) 10 MG tablet  ?  Sig: Take 1 tablet  (10 mg total) by mouth daily as needed for muscle spasms.  ?  Dispense:  30 tablet  ?  Refill:  2  ?  Order Specific Question:   Supervising Provider  ?  Answer:   Crecencio Mc [[id number]]  ?? Magnesium Citrate 200 MG TABS  ?  Si

## 2021-06-19 NOTE — Assessment & Plan Note (Signed)
Chronic,symptomatically stable.  Continue Toprol 12.5 mg twice daily ?

## 2021-06-19 NOTE — Assessment & Plan Note (Signed)
Reassuring neurologic exam today.  No alarm features at this time.  We have opted to start magnesium citrate 400 mg daily as preventative medication.  She will continue to use Fioricet for rescue.  Controlled substance contract has been completed.  Close follow-up in 4 weeks time ?

## 2021-06-19 NOTE — Progress Notes (Signed)
Patient having back pain for last couple years has had xray and they found nothing..happens daily to where  she usually has to use heating pad in afternoon needs something for relief she was on cyclobenzapine would like to get back on it. ?

## 2021-06-19 NOTE — Patient Instructions (Signed)
Start magnesium for headache prevention please keep a headache diary and make note of any triggers you may identify ?Please start Cymbalta 30 mg for low back pain.  I have provided you with exercises to trial as well.  I also refilled Flexeril ?Do not drive or operate heavy machinery while on muscle relaxant. Please do not drink alcohol. Only take this medication as needed for acute muscle spasm at bedtime. This medication make you feel drowsy so be very careful.  Stop taking if become too drowsy or somnolent as this puts you at risk for falls. Please contact our office with any questions.  ? ?Low Back Sprain or Strain Rehab ?Ask your health care provider which exercises are safe for you. Do exercises exactly as told by your health care provider and adjust them as directed. It is normal to feel mild stretching, pulling, tightness, or discomfort as you do these exercises. Stop right away if you feel sudden pain or your pain gets worse. Do not begin these exercises until told by your health care provider. ?Stretching and range-of-motion exercises ?These exercises warm up your muscles and joints and improve the movement and flexibility of your back. These exercises also help to relieve pain, numbness, and tingling. ?Lumbar rotation ? ?Lie on your back on a firm bed or the floor with your knees bent. ?Straighten your arms out to your sides so each arm forms a 90-degree angle (right angle) with a side of your body. ?Slowly move (rotate) both of your knees to one side of your body until you feel a stretch in your lower back (lumbar). Try not to let your shoulders lift off the floor. ?Hold this position for __________ seconds. ?Tense your abdominal muscles and slowly move your knees back to the starting position. ?Repeat this exercise on the other side of your body. ?Repeat __________ times. Complete this exercise __________ times a day. ?Single knee to chest ? ?Lie on your back on a firm bed or the floor with both legs  straight. ?Bend one of your knees. Use your hands to move your knee up toward your chest until you feel a gentle stretch in your lower back and buttock. ?Hold your leg in this position by holding on to the front of your knee. ?Keep your other leg as straight as possible. ?Hold this position for __________ seconds. ?Slowly return to the starting position. ?Repeat with your other leg. ?Repeat __________ times. Complete this exercise __________ times a day. ?Prone extension on elbows ? ?Lie on your abdomen on a firm bed or the floor (prone position). ?Prop yourself up on your elbows. ?Use your arms to help lift your chest up until you feel a gentle stretch in your abdomen and your lower back. ?This will place some of your body weight on your elbows. If this is uncomfortable, try stacking pillows under your chest. ?Your hips should stay down, against the surface that you are lying on. Keep your hip and back muscles relaxed. ?Hold this position for __________ seconds. ?Slowly relax your upper body and return to the starting position. ?Repeat __________ times. Complete this exercise __________ times a day. ?Strengthening exercises ?These exercises build strength and endurance in your back. Endurance is the ability to use your muscles for a long time, even after they get tired. ?Pelvic tilt ?This exercise strengthens the muscles that lie deep in the abdomen. ?Lie on your back on a firm bed or the floor with your legs extended. ?Bend your knees so they are  pointing toward the ceiling and your feet are flat on the floor. ?Tighten your lower abdominal muscles to press your lower back against the floor. This motion will tilt your pelvis so your tailbone points up toward the ceiling instead of pointing to your feet or the floor. ?To help with this exercise, you may place a small towel under your lower back and try to push your back into the towel. ?Hold this position for __________ seconds. ?Let your muscles relax completely  before you repeat this exercise. ?Repeat __________ times. Complete this exercise __________ times a day. ?Alternating arm and leg raises ? ?Get on your hands and knees on a firm surface. If you are on a hard floor, you may want to use padding, such as an exercise mat, to cushion your knees. ?Line up your arms and legs. Your hands should be directly below your shoulders, and your knees should be directly below your hips. ?Lift your left leg behind you. At the same time, raise your right arm and straighten it in front of you. ?Do not lift your leg higher than your hip. ?Do not lift your arm higher than your shoulder. ?Keep your abdominal and back muscles tight. ?Keep your hips facing the ground. ?Do not arch your back. ?Keep your balance carefully, and do not hold your breath. ?Hold this position for __________ seconds. ?Slowly return to the starting position. ?Repeat with your right leg and your left arm. ?Repeat __________ times. Complete this exercise __________ times a day. ?Abdominal set with straight leg raise ? ?Lie on your back on a firm bed or the floor. ?Bend one of your knees and keep your other leg straight. ?Tense your abdominal muscles and lift your straight leg up, 4-6 inches (10-15 cm) off the ground. ?Keep your abdominal muscles tight and hold this position for __________ seconds. ?Do not hold your breath. ?Do not arch your back. Keep it flat against the ground. ?Keep your abdominal muscles tense as you slowly lower your leg back to the starting position. ?Repeat with your other leg. ?Repeat __________ times. Complete this exercise __________ times a day. ?Single leg lower with bent knees ?Lie on your back on a firm bed or the floor. ?Tense your abdominal muscles and lift your feet off the floor, one foot at a time, so your knees and hips are bent in 90-degree angles (right angles). ?Your knees should be over your hips and your lower legs should be parallel to the floor. ?Keeping your abdominal  muscles tense and your knee bent, slowly lower one of your legs so your toe touches the ground. ?Lift your leg back up to return to the starting position. ?Do not hold your breath. ?Do not let your back arch. Keep your back flat against the ground. ?Repeat with your other leg. ?Repeat __________ times. Complete this exercise __________ times a day. ?Posture and body mechanics ?Good posture and healthy body mechanics can help to relieve stress in your body's tissues and joints. Body mechanics refers to the movements and positions of your body while you do your daily activities. Posture is part of body mechanics. Good posture means: ?Your spine is in its natural S-curve position (neutral). ?Your shoulders are pulled back slightly. ?Your head is not tipped forward (neutral). ?Follow these guidelines to improve your posture and body mechanics in your everyday activities. ?Standing ? ?When standing, keep your spine neutral and your feet about hip-width apart. Keep a slight bend in your knees. Your ears, shoulders, and hips  should line up. ?When you do a task in which you stand in one place for a long time, place one foot up on a stable object that is 2-4 inches (5-10 cm) high, such as a footstool. This helps keep your spine neutral. ?Sitting ? ?When sitting, keep your spine neutral and keep your feet flat on the floor. Use a footrest, if necessary, and keep your thighs parallel to the floor. Avoid rounding your shoulders, and avoid tilting your head forward. ?When working at a desk or a computer, keep your desk at a height where your hands are slightly lower than your elbows. Slide your chair under your desk so you are close enough to maintain good posture. ?When working at a computer, place your monitor at a height where you are looking straight ahead and you do not have to tilt your head forward or downward to look at the screen. ?Resting ?When lying down and resting, avoid positions that are most painful for you. ?If  you have pain with activities such as sitting, bending, stooping, or squatting, lie in a position in which your body does not bend very much. For example, avoid curling up on your side with your arms and knees

## 2021-06-19 NOTE — Assessment & Plan Note (Addendum)
Improved and not particularly bothersome for patient at this time.  Reviewed previous work-up.  We will continue monitor closely.  Referral to neurology again if symptom recurs. ?

## 2021-07-09 ENCOUNTER — Encounter: Payer: Commercial Managed Care - PPO | Admitting: Family

## 2021-08-14 ENCOUNTER — Ambulatory Visit: Payer: Commercial Managed Care - PPO | Admitting: Family

## 2021-08-30 ENCOUNTER — Other Ambulatory Visit: Payer: Self-pay

## 2021-08-30 MED ORDER — METOPROLOL SUCCINATE ER 25 MG PO TB24: 12.5000 mg | ORAL_TABLET | Freq: Two times a day (BID) | ORAL | 2 refills | Status: DC

## 2021-10-30 ENCOUNTER — Ambulatory Visit: Payer: Commercial Managed Care - PPO | Admitting: Family

## 2021-10-30 ENCOUNTER — Encounter: Payer: Self-pay | Admitting: Family

## 2021-10-30 DIAGNOSIS — M545 Low back pain, unspecified: Secondary | ICD-10-CM | POA: Diagnosis not present

## 2021-10-30 DIAGNOSIS — R519 Headache, unspecified: Secondary | ICD-10-CM | POA: Diagnosis not present

## 2021-10-30 DIAGNOSIS — G8929 Other chronic pain: Secondary | ICD-10-CM | POA: Diagnosis not present

## 2021-10-30 NOTE — Patient Instructions (Addendum)
Headache therapy as we discussed today-  If you have menstrual migraine, you may start magnesium beginning 15 days prior to menses until the start of menses. However it might be easier just to take it as a daily preventative.    Often as a daily preventative, I recommend taking magnesium citrate with riboflavin '400mg'$  daily and CoQ10 '100mg'$  three times daily. You may find these supplements over the counter.   We also discussed for RESCUE therapy ( NOT everyday) for Headache you may try the below instead of butalbital acetaminophen caffeine ( Fioricet)   You may zofran with naproxen  sodium '550mg'$ .  May take naproxen sodium which is over-the-counter 550 mg for earliest onset of headache you may repeat the dose 12 hours later.  Please not exceed 2 doses of naproxen sodium in a 24-hour.   Please let me know how you are doing.

## 2021-10-30 NOTE — Progress Notes (Signed)
Subjective:    Patient ID: Valerie Gardner, female    DOB: [birth year], 31 y.o.   MRN: [id number]  CC: Valerie Gardner is a 31 y.o. female who presents today for follow up.   HPI: She continues to have frontal headaches.  These headaches are unchanged from previous.   HA are worse around menses, such as 3 HA days /week around menses.  Endorses photophobia with occasional nausea.  No vision loss HA are not worse of life.  Fioricet is helpful for headache    SVT-compliant with Toprol 12.5 mg twice daily  Low back pain has improved.  She never tried Cymbalta 30 mg. She has used Flexeril 10 mg prn with relief.  She has not found Flexeril to be sedating HISTORY:  Past Medical History:  Diagnosis Date   [redacted] weeks gestation of pregnancy 04/02/2017   Anxiety    PCOS (polycystic ovarian syndrome)    Supraventricular tachycardia ([place])    Past Surgical History:  Procedure Laterality Date   NO PAST SURGERIES     Family History  Problem Relation Age of Onset   Hypertension Mother    Hypertension Father    Diabetes Mellitus I Sister    Clotting disorder Sister        blood clot in spine after covid   Melanoma Maternal Grandmother    Diabetes Mellitus II Paternal Grandmother    COPD Paternal Grandmother    Diabetes Paternal Grandmother    Brain cancer Paternal Grandfather     Allergies: Patient has no known allergies. Current Outpatient Medications on File Prior to Visit  Medication Sig Dispense Refill   butalbital-acetaminophen-caffeine (FIORICET) 50-325-40 MG tablet Take 1 tablet by mouth 2 (two) times daily as needed for headache. 30 tablet 0   cyclobenzaprine (FLEXERIL) 10 MG tablet Take 1 tablet (10 mg total) by mouth daily as needed for muscle spasms. 30 tablet 2   hydrOXYzine (ATARAX/VISTARIL) 25 MG tablet TAKE 1-2 TABLETS (25-50 MG TOTAL) BY MOUTH 3 (THREE) TIMES DAILY. 540 tablet 0   Magnesium Citrate 200 MG TABS Take 400 mg by mouth daily. 120 tablet 1    metoprolol succinate (TOPROL-XL) 25 MG 24 hr tablet Take 0.5 tablets (12.5 mg total) by mouth 2 (two) times daily. 90 tablet 2   ondansetron (ZOFRAN) 4 MG tablet Take 1 tablet (4 mg total) by mouth every 8 (eight) hours as needed for nausea or vomiting. 20 tablet 0   No current facility-administered medications on file prior to visit.    Social History   Tobacco Use   Smoking status: Former   Smokeless tobacco: Never  Scientific laboratory technician Use: Never used  Substance Use Topics   Alcohol use: No   Drug use: No    Review of Systems  Constitutional:  Negative for chills and fever.  Respiratory:  Negative for cough.   Cardiovascular:  Negative for chest pain and palpitations.  Gastrointestinal:  Negative for nausea and vomiting.  Musculoskeletal:  Negative for back pain (resolved).  Neurological:  Positive for headaches.      Objective:    BP 120/60 (BP Location: Left Arm, Patient Position: Sitting, Cuff Size: Normal)   Pulse 86   Temp 97.8 F (36.6 C) (Oral)   Ht '5\' 6"'$  (1.676 m)   Wt 172 lb 9.6 oz (78.3 kg)   LMP 10/11/2021 (Exact Date)   SpO2 97%   BMI 27.86 kg/m  BP Readings from Last 3 Encounters:  10/30/21 120/60  06/19/21 118/70  04/23/21 114/70   Wt Readings from Last 3 Encounters:  10/30/21 172 lb 9.6 oz (78.3 kg)  06/19/21 167 lb 3.2 oz (75.8 kg)  04/23/21 134 lb (60.8 kg)    Physical Exam Vitals reviewed.  Constitutional:      Appearance: She is well-developed.  Eyes:     Conjunctiva/sclera: Conjunctivae normal.  Cardiovascular:     Rate and Rhythm: Normal rate and regular rhythm.     Pulses: Normal pulses.     Heart sounds: Normal heart sounds.  Pulmonary:     Effort: Pulmonary effort is normal.     Breath sounds: Normal breath sounds. No wheezing, rhonchi or rales.  Skin:    General: Skin is warm and dry.  Neurological:     Mental Status: She is alert.  Psychiatric:        Speech: Speech normal.        Behavior: Behavior normal.         Thought Content: Thought content normal.        Assessment & Plan:   Problem List Items Addressed This Visit       Other   Chronic left-sided low back pain without sciatica    Chronic, improved overall.  She did not start Cymbalta.  Flexeril 10 mg has been helpful prn.       Headache    Headache unchanged from previous.  Patient and I discussed treatment in regards to rescue versus preventative therapy.    I offered she may also trial riboflavin '400mg'$  daily and CoQ10 '100mg'$  three times daily with magnesium citrate.  Advised her as well that she may use naproxen sodium with Zofran as rescue therapy as well.  She politely declines daily preventative medication at this time.  We briefly discussed Effexor.  she will keep a headache journal.  She will let me know how she is doing        I have discontinued Valerie Gardner's DULoxetine. I am also having her maintain her ondansetron, hydrOXYzine, butalbital-acetaminophen-caffeine, cyclobenzaprine, Magnesium Citrate, and metoprolol succinate.   No orders of the defined types were placed in this encounter.   Return precautions given.   Risks, benefits, and alternatives of the medications and treatment plan prescribed today were discussed, and patient expressed understanding.   Education regarding symptom management and diagnosis given to patient on AVS.  Continue to follow with Valerie Hawthorne, FNP for routine health maintenance.   Valerie Gardner and I agreed with plan.   Valerie Paris, FNP

## 2021-10-30 NOTE — Assessment & Plan Note (Signed)
Chronic, improved overall.  She did not start Cymbalta.  Flexeril 10 mg has been helpful prn.

## 2021-10-30 NOTE — Assessment & Plan Note (Signed)
Headache unchanged from previous.  Patient and I discussed treatment in regards to rescue versus preventative therapy.    I offered she may also trial riboflavin '400mg'$  daily and CoQ10 '100mg'$  three times daily with magnesium citrate.  Advised her as well that she may use naproxen sodium with Zofran as rescue therapy as well.  She politely declines daily preventative medication at this time.  We briefly discussed Effexor.  she will keep a headache journal.  She will let me know how she is doing

## 2021-12-07 ENCOUNTER — Other Ambulatory Visit: Payer: Self-pay | Admitting: Family Medicine

## 2021-12-07 ENCOUNTER — Other Ambulatory Visit: Payer: Self-pay | Admitting: Family

## 2021-12-07 DIAGNOSIS — R110 Nausea: Secondary | ICD-10-CM

## 2021-12-09 MED ORDER — ONDANSETRON HCL 4 MG PO TABS: 4.0000 mg | ORAL_TABLET | Freq: Three times a day (TID) | ORAL | 0 refills | Status: DC | PRN

## 2021-12-10 MED ORDER — BUTALBITAL-APAP-CAFFEINE 50-325-40 MG PO TABS: 1.0000 | ORAL_TABLET | Freq: Two times a day (BID) | ORAL | 0 refills | Status: DC | PRN

## 2022-03-10 ENCOUNTER — Encounter: Payer: Self-pay | Admitting: Family

## 2022-04-26 ENCOUNTER — Encounter: Payer: Self-pay | Admitting: Family

## 2022-04-28 NOTE — Telephone Encounter (Signed)
Spoke to pt and scheduled in office visit on 3/27@ 9 am

## 2022-04-30 ENCOUNTER — Ambulatory Visit: Payer: Commercial Managed Care - PPO | Admitting: Family

## 2022-04-30 ENCOUNTER — Encounter: Payer: Self-pay | Admitting: Family

## 2022-04-30 VITALS — BP 118/78 | HR 62 | Temp 98.0°F | Ht 66.0 in | Wt 170.8 lb

## 2022-04-30 DIAGNOSIS — N926 Irregular menstruation, unspecified: Secondary | ICD-10-CM | POA: Insufficient documentation

## 2022-04-30 DIAGNOSIS — R399 Unspecified symptoms and signs involving the genitourinary system: Secondary | ICD-10-CM | POA: Diagnosis not present

## 2022-04-30 DIAGNOSIS — R519 Headache, unspecified: Secondary | ICD-10-CM | POA: Diagnosis not present

## 2022-04-30 DIAGNOSIS — I471 Supraventricular tachycardia, unspecified: Secondary | ICD-10-CM

## 2022-04-30 DIAGNOSIS — N946 Dysmenorrhea, unspecified: Secondary | ICD-10-CM

## 2022-04-30 LAB — POCT URINE PREGNANCY: Preg Test, Ur: NEGATIVE

## 2022-04-30 LAB — TSH: TSH: 4.51 u[IU]/mL (ref 0.35–5.50)

## 2022-04-30 NOTE — Assessment & Plan Note (Signed)
Chronic,symptomatically stable.  Continue Toprol 12.5 mg twice daily ?

## 2022-04-30 NOTE — Patient Instructions (Signed)
Please let me know how you are doing in regards to vaginal bleeding if this persists.  At that point I may recommend transvaginal ultrasound or consideration for hormonal therapy.  If you can clarify exactly your sister's health history in regards to the blood clot.  Please ask if she had a deep vein thrombosis or pulmonary embolism.  Does she have a known clotting disorder?  Nice to see you!

## 2022-04-30 NOTE — Assessment & Plan Note (Signed)
Headache resolved at this time. Suspect menstrual related.   Headaches are unchanged from prior and overall infrequent.  Encourage patient to resume magnesium citrate 400 mg daily as a preventative

## 2022-04-30 NOTE — Progress Notes (Signed)
Assessment & Plan:  Dysmenorrhea Assessment & Plan: No urinary symptoms.  Counseled her on the importance of checking urine only in the setting of symptoms.  I am not sure the accuracy of at home urine infection test.  Pending urinalysis, urine culture.   patient has a sister with a history of blood clot though unsure context of this.  Asked patient to further clarify with her sister as she reports a blood clot in her sister's spine.  Patient has previously been on birth control.  We discussed this could be a potential option for her to regulate her cycles however we will need to clarify history of any potential clotting or bleeding disorder within her family  Orders: -     POCT Pregnancy, Urine -     TSH -     Urine Culture; Future -     Urinalysis, Routine w reflex microscopic; Future  SVT (supraventricular tachycardia) Assessment & Plan: Chronic,symptomatically stable.  Continue Toprol 12.5 mg twice daily   Nonintractable episodic headache, unspecified headache type Assessment & Plan: Headache resolved at this time. Suspect menstrual related.   Headaches are unchanged from prior and overall infrequent.  Encourage patient to resume magnesium citrate 400 mg daily as a preventative   UTI symptoms -     POCT urine pregnancy     Return precautions given.   Risks, benefits, and alternatives of the medications and treatment plan prescribed today were discussed, and patient expressed understanding.   Education regarding symptom management and diagnosis given to patient on AVS either electronically or printed.  Return in about 6 months (around 10/31/2022) for Complete Physical Exam.  Mable Paris, FNP  Subjective:    Patient ID: Valerie Gardner, female    DOB: [birth year], 32 y.o.   MRN: [id number]  CC: Valerie Gardner is a 31 y.o. female who presents today for an acute visit.    HPI: Here today for abnormal results of urine test at home.   She reports that she  took an at home urine test prompted after her daughter took 1 which was positive.  She states at home urine test reported positive for leukocytes, negative nitrite.  LMP : 03/08/22  She has bloody vaginal discharge and spotting the past couple of days. No dysuria, urinary frequency, vaginal itching.   Negative pregnancy test at home.   Menses can range from 60-80 days. Lately menses have been monthly.   No changes in stress, exercise  She has left sided HA one week ago , since resovled. Reports prior HA was a couple of months ago. Felt similar to prior HA.  Endorses photophobia. She took 800mg  ibuprofen and went to sleep and HA improved. She thought r/t to seasonal allergies, menses.  She is compliant with metoprolol XR 12.5 mg BID for tachycardia.  She is currently not taking magnesium citrate.   Denies vision changes, nausea.  Drinks one energy drink daily.    No personal history of DVT, migraine with aura or known bleeding/clotting disorder. No history of cancer. Patient states she's not pregnant or breast-feeding.No concern for STDs.   She has been on oral birth control in the past.   H/o PCOS  She states her sisiter was diagnosed with blood clot in 'her spine' after covid.      Pap smear 09/09/2019 NIL.   Allergies: Patient has no known allergies. Current Outpatient Medications on File Prior to Visit  Medication Sig Dispense Refill   butalbital-acetaminophen-caffeine (FIORICET) 50-325-40 MG tablet  Take 1 tablet by mouth 2 (two) times daily as needed for headache. 30 tablet 0   cyclobenzaprine (FLEXERIL) 10 MG tablet Take 1 tablet (10 mg total) by mouth daily as needed for muscle spasms. 30 tablet 2   hydrOXYzine (ATARAX/VISTARIL) 25 MG tablet TAKE 1-2 TABLETS (25-50 MG TOTAL) BY MOUTH 3 (THREE) TIMES DAILY. 540 tablet 0   Magnesium Citrate 200 MG TABS Take 400 mg by mouth daily. 120 tablet 1   metoprolol succinate (TOPROL-XL) 25 MG 24 hr tablet Take 0.5 tablets (12.5 mg total)  by mouth 2 (two) times daily. 90 tablet 2   ondansetron (ZOFRAN) 4 MG tablet Take 1 tablet (4 mg total) by mouth every 8 (eight) hours as needed for nausea or vomiting. 20 tablet 0   No current facility-administered medications on file prior to visit.    Review of Systems  Constitutional:  Negative for chills and fever.  Eyes:  Negative for visual disturbance.  Respiratory:  Negative for cough.   Cardiovascular:  Negative for chest pain and palpitations.  Gastrointestinal:  Negative for nausea and vomiting.  Genitourinary:  Positive for vaginal bleeding. Negative for difficulty urinating, dysuria and flank pain.  Neurological:  Negative for headaches (resolved).      Objective:    BP 118/78   Pulse 62   Temp 98 F (36.7 C) (Oral)   Ht 5\' 6"  (1.676 m)   Wt 170 lb 12.8 oz (77.5 kg)   LMP  (LMP Unknown)   SpO2 98%   BMI 27.57 kg/m   BP Readings from Last 3 Encounters:  04/30/22 118/78  10/30/21 120/60  06/19/21 118/70   Wt Readings from Last 3 Encounters:  04/30/22 170 lb 12.8 oz (77.5 kg)  10/30/21 172 lb 9.6 oz (78.3 kg)  06/19/21 167 lb 3.2 oz (75.8 kg)    Physical Exam Vitals reviewed.  Constitutional:      Appearance: She is well-developed.  Cardiovascular:     Rate and Rhythm: Normal rate and regular rhythm.     Pulses: Normal pulses.     Heart sounds: Normal heart sounds.  Pulmonary:     Effort: Pulmonary effort is normal.     Breath sounds: Normal breath sounds. No wheezing, rhonchi or rales.  Skin:    General: Skin is warm and dry.  Neurological:     Mental Status: She is alert.  Psychiatric:        Speech: Speech normal.        Behavior: Behavior normal.        Thought Content: Thought content normal.

## 2022-04-30 NOTE — Assessment & Plan Note (Signed)
No urinary symptoms.  Counseled her on the importance of checking urine only in the setting of symptoms.  I am not sure the accuracy of at home urine infection test.  Pending urinalysis, urine culture.   patient has a sister with a history of blood clot though unsure context of this.  Asked patient to further clarify with her sister as she reports a blood clot in her sister's spine.  Patient has previously been on birth control.  We discussed this could be a potential option for her to regulate her cycles however we will need to clarify history of any potential clotting or bleeding disorder within her family

## 2022-05-01 LAB — URINE CULTURE
MICRO NUMBER:: 14749226
SPECIMEN QUALITY:: ADEQUATE

## 2022-05-01 LAB — URINALYSIS, ROUTINE W REFLEX MICROSCOPIC
Bilirubin Urine: NEGATIVE
Ketones, ur: NEGATIVE
Nitrite: NEGATIVE
Specific Gravity, Urine: 1.02 (ref 1.000–1.030)
Total Protein, Urine: NEGATIVE
Urine Glucose: NEGATIVE
Urobilinogen, UA: 0.2 (ref 0.0–1.0)
pH: 6 (ref 5.0–8.0)

## 2022-05-02 ENCOUNTER — Encounter: Payer: Commercial Managed Care - PPO | Admitting: Family

## 2022-05-06 ENCOUNTER — Telehealth: Payer: Self-pay | Admitting: Family

## 2022-05-06 ENCOUNTER — Encounter: Payer: Self-pay | Admitting: Family

## 2022-05-06 ENCOUNTER — Ambulatory Visit (INDEPENDENT_AMBULATORY_CARE_PROVIDER_SITE_OTHER): Payer: Commercial Managed Care - PPO | Admitting: Family

## 2022-05-06 ENCOUNTER — Other Ambulatory Visit (HOSPITAL_COMMUNITY)
Admission: RE | Admit: 2022-05-06 | Discharge: 2022-05-06 | Disposition: A | Discharge status: Home / Self Care | Payer: Commercial Managed Care - PPO | Source: Ambulatory Visit | Attending: Family | Admitting: Family

## 2022-05-06 VITALS — BP 118/70 | HR 81 | Temp 97.7°F | Ht 68.0 in | Wt 172.4 lb

## 2022-05-06 DIAGNOSIS — R635 Abnormal weight gain: Secondary | ICD-10-CM

## 2022-05-06 DIAGNOSIS — Z0000 Encounter for general adult medical examination without abnormal findings: Secondary | ICD-10-CM | POA: Diagnosis not present

## 2022-05-06 DIAGNOSIS — Z136 Encounter for screening for cardiovascular disorders: Secondary | ICD-10-CM

## 2022-05-06 DIAGNOSIS — Z13220 Encounter for screening for lipoid disorders: Secondary | ICD-10-CM

## 2022-05-06 DIAGNOSIS — N946 Dysmenorrhea, unspecified: Secondary | ICD-10-CM

## 2022-05-06 DIAGNOSIS — Z8639 Personal history of other endocrine, nutritional and metabolic disease: Secondary | ICD-10-CM

## 2022-05-06 DIAGNOSIS — Z124 Encounter for screening for malignant neoplasm of cervix: Secondary | ICD-10-CM | POA: Diagnosis not present

## 2022-05-06 LAB — CBC WITH DIFFERENTIAL/PLATELET
Basophils Absolute: 0 10*3/uL (ref 0.0–0.1)
Basophils Relative: 0.7 % (ref 0.0–3.0)
Eosinophils Absolute: 0.1 10*3/uL (ref 0.0–0.7)
Eosinophils Relative: 1.6 % (ref 0.0–5.0)
HCT: 38.6 % (ref 36.0–46.0)
Hemoglobin: 13 g/dL (ref 12.0–15.0)
Lymphocytes Relative: 41.9 % (ref 12.0–46.0)
Lymphs Abs: 2.3 10*3/uL (ref 0.7–4.0)
MCHC: 33.8 g/dL (ref 30.0–36.0)
MCV: 89.4 fl (ref 78.0–100.0)
Monocytes Absolute: 0.4 10*3/uL (ref 0.1–1.0)
Monocytes Relative: 7.6 % (ref 3.0–12.0)
Neutro Abs: 2.6 10*3/uL (ref 1.4–7.7)
Neutrophils Relative %: 48.2 % (ref 43.0–77.0)
Platelets: 233 10*3/uL (ref 150.0–400.0)
RBC: 4.32 Mil/uL (ref 3.87–5.11)
RDW: 13.2 % (ref 11.5–15.5)
WBC: 5.4 10*3/uL (ref 4.0–10.5)

## 2022-05-06 LAB — LIPID PANEL
Cholesterol: 226 mg/dL — ABNORMAL HIGH (ref 0–200)
HDL: 40.7 mg/dL (ref >39.00)
NonHDL: 184.9
Total CHOL/HDL Ratio: 6
Triglycerides: 322 mg/dL — ABNORMAL HIGH (ref 0.0–149.0)
VLDL: 64.4 mg/dL — ABNORMAL HIGH (ref 0.0–40.0)

## 2022-05-06 LAB — HEMOGLOBIN A1C: Hgb A1c MFr Bld: 5.2 % (ref 4.6–6.5)

## 2022-05-06 LAB — VITAMIN D 25 HYDROXY (VIT D DEFICIENCY, FRACTURES): VITD: 18.52 ng/mL — ABNORMAL LOW (ref 30.00–100.00)

## 2022-05-06 LAB — LDL CHOLESTEROL, DIRECT: Direct LDL: 133 mg/dL

## 2022-05-06 MED ORDER — METFORMIN HCL ER 500 MG PO TB24: 1000.0000 mg | ORAL_TABLET | Freq: Two times a day (BID) | ORAL | 3 refills | Status: DC

## 2022-05-06 NOTE — Telephone Encounter (Signed)
Can I add cbc with labs today?

## 2022-05-06 NOTE — Assessment & Plan Note (Signed)
Pending insulin, A1c.  She reports history of PCOS.  We discussed low glycemic diet.  Will start and titrate metformin to aid in weight loss.

## 2022-05-06 NOTE — Assessment & Plan Note (Addendum)
She is outside of the typical time between menses ( 21 to 45 days).  She is euthyroid.  She reports H/o PCOS .  No evidence of PCOS by ultrasound in 2019.  No dysmenorrhea.  In the absence of pelvic pain patient I agree to monitor.  She will keep a journal.  We discussed our hesitancy in regards to starting OCP due to family history.  She is also not particularly bothered by her menses at this time.  she politely declines transvaginal ultrasound at this time to establish diagnosis of PCOS.

## 2022-05-06 NOTE — Assessment & Plan Note (Signed)
Clinical breast exam performed today.  Encouraged self breast exam at home.  Pap smear obtained.  Encouraged low glycemic diet.

## 2022-05-06 NOTE — Telephone Encounter (Signed)
Yes, we will send order with tube

## 2022-05-06 NOTE — Patient Instructions (Addendum)
Please keep journal in regards to your period.  If continued to be irregular, please let me know so we can proceed seed with further evaluation including labs and ultra   metformin is used in prediabetes, diabetes, and also for weight loss by decreasing calorie consumption.   It works in a couple of ways by decreasing liver glucose production, decreases intestinal absorption of glucose and improves insulin sensitivity (increases peripheral glucose uptake and utilization).     Please make sure that you titrate per below so not to cause any GI upset.  The instructions on the metformin bottle however say metformin 1000mg  twice daily so that you do not run out of medication when you are on maximum dose but you will need to titrate to get there.    Lets trial metformin  Start metformin XR with one 500mg  tablet at night. After one week, you may increase to two tablets at night ( total of 1000mg ) . The third week, you may take take two tablets at night and one tablet in the morning.  The fourth week, you may take two tablets in the morning ( 1000mg  total) and two tablets at night (1000mg  total). This will bring you to a maximum daily dose of 2000mg /day which is maximum dose.  So you are aware,  you may take ALL 4 tablets of metformin together at the same time if preferable and doesn't cause GI upset. You may take metformin 2000mg  ( four of the 500mg  tablets) together in the morning or at night if you prefer.   Along the way, if you want to increase more slowly, please do as this medication can cause GI discomfort and loose stools which usually get better with time , however some patients find that they can only tolerate a certain dose and cannot increase to maximum dose.    Please download Myfitness Pal App ( free). You may log every thing you eat for even 2-3 days to get a better of idea of true daily calories. To loose weight, you have to use more calories than than consumed and essentially create  caloric deficit to loose weight. The goal is 1-2 lbs per week of weight loss.   You review below from [facility].   [email or web address].uk  Calorie counting made easy  Eat less, exercise more. If only it were that simple! As most dieters know, losing weight can be very challenging. As this report details, a range of influences can affect how people gain and lose weight. But a basic understanding of how to tip your energy balance in favor of weight loss is a good place to start.  Start by determining how many calories you should consume each day. To do so, you need to know how many calories you need to maintain your current weight. Doing this requires a few simple calculations.  First, multiply your current weight by 15 -- that's roughly the number of calories per pound of body weight needed to maintain your current weight if you are moderately active. Moderately active means getting at least 30 minutes of physical activity a day in the form of exercise (walking at a brisk pace, climbing stairs, or active gardening). Let's say you're a woman who is 5 feet, 4 inches tall and weighs 155 pounds, and you need to lose about 15 pounds to put you in a healthy weight range. If you multiply 155 by 15, you will get 2,325, which is the number of calories per day that you need in  order to maintain your current weight (weight-maintenance calories). To lose weight, you will need to get below that total.  For example, to lose 1 to 2 pounds a week -- a rate that experts consider safe -- your food consumption should provide 500 to 1,000 calories less than your total weight-maintenance calories. If you need 2,325 calories a day to maintain your current weight, reduce your daily calories to between 1,325 and 1,825. If you are sedentary, you will also need to build more activity into your day. In order to lose at least a pound a week, try to do at least 30 minutes of  physical activity on most days, and reduce your daily calorie intake by at least 500 calories. However, calorie intake should not fall below 1,200 a day in women or 1,500 a day in men, except under the supervision of a health professional. Eating too few calories can endanger your health by depriving you of needed nutrients.  Meeting your calorie target How can you meet your daily calorie target? One approach is to add up the number of calories per serving of all the foods that you eat, and then plan your menus accordingly. You can buy books that list calories per serving for many foods. In addition, the nutrition labels on all packaged foods and beverages provide calories per serving information. Make a point of reading the labels of the foods and drinks you use, noting the number of calories and the serving sizes. Many recipes published in cookbooks, newspapers, and magazines provide similar information.  If you hate counting calories, a different approach is to restrict how much and how often you eat, and to eat meals that are low in calories. Dietary guidelines issued by the American Heart Association stress common sense in choosing your foods rather than focusing strictly on numbers, such as total calories or calories from fat. Whichever method you choose, research shows that a regular eating schedule -- with meals and snacks planned for certain times each day -- makes for the most successful approach. The same applies after you have lost weight and want to keep it off. Sticking with an eating schedule increases your chance of maintaining your new weight.    This is  Dr. Lupita Dawn  ( an amazing physician in my office!)  example of a  "Low GI"  Diet:  It will allow you to lose 4 to 8  lbs  per month if you follow it carefully.  Your goal with exercise is a minimum of 30 minutes of aerobic exercise 5 days per week (Walking does not count once it becomes easy!)    All of the foods can be found at grocery  stores and in bulk at [employer].  The Atkins protein bars and shakes are available in more varieties at [employer], [employer] and [employer].     7 AM Breakfast:  Choose from the following:  Low carbohydrate Protein  Shakes (I recommend the  Premier Protein chocolate shakes,  EAS AdvantEdge "Carb Control" shakes  Or the Atkins shakes all are under 3 net carbs)     a scrambled egg/bacon/cheese burrito made with Mission's "carb balance" whole wheat tortilla  (about 10 net carbs )  Regulatory affairs officer (basically a quiche without the pastry crust) that is eaten cold and very convenient way to get your eggs.  8 carbs)  If you make your own protein shakes, avoid bananas and pineapple,  And use low carb greek yogurt or  original /unsweetened almond or soy milk    Avoid cereal and bananas, oatmeal and cream of wheat and grits. They are loaded with carbohydrates!   10 AM: high protein snack:  Protein bar by Atkins (the snack size, under 200 cal, usually < 6 net carbs).    A stick of cheese:  Around 1 carb,  100 cal     Dannon Light n Fit [place] Yogurt  (80 cal, 8 carbs)  Other so called "protein bars" and Greek yogurts tend to be loaded with carbohydrates.  Remember, in food advertising, the word "energy" is synonymous for " carbohydrate."  Lunch:   A Sandwich using the bread choices listed, Can use any  Eggs,  lunchmeat, grilled meat or canned tuna), avocado, regular mayo/mustard  and cheese.  A Salad using blue cheese, ranch,  Goddess or vinagrette,  Avoid taco shells, croutons or "confetti" and no "candied nuts" but regular nuts OK.   No pretzels, nabs  or chips.  Pickles and miniature sweet peppers are a good low carb alternative that provide a "crunch"  The bread is the only source of carbohydrate in a sandwich and  can be decreased by trying some of the attached alternatives to traditional loaf bread   Avoid "Low fat dressings, as well as [place] dressings  They are loaded with sugar!   3 PM/ Mid day  Snack:  Consider  1 ounce of  almonds, walnuts, pistachios, pecans, peanuts,  Macadamia nuts or a nut medley.  Avoid "granola and granola bars "  Mixed nuts are ok in moderation as long as there are no raisins,  cranberries or dried fruit.   KIND bars are OK if you get the low glycemic index variety   Try the prosciutto/mozzarella cheese sticks by [employer]  In deli /backery section   High protein      6 PM  Dinner:     Meat/fowl/fish with a green salad, and either broccoli, cauliflower, green beans, spinach, brussel sprouts or  Lima beans. DO NOT BREAD THE PROTEIN!!      There is a low carb pasta by Dreamfield's that is acceptable and tastes great: only 5 digestible carbs/serving.( All grocery stores but BJs carry it ) Several ready made meals are available low carb:   Try Michel Angelo's chicken piccata or chicken or eggplant parm over low carb pasta.(Lowes and BJs)   Marjory Lies Sanchez's "Carnitas" (pulled pork, no sauce,  0 carbs) or his beef pot roast to make a dinner burrito (at [employer]J's)  Pesto over low carb pasta (bj's sells a good quality pesto in the center refrigerated section of the deli   Try satueeing  Cheral Marker with mushroooms as a good side   [employer] makes a mashed cauliflower that tastes like mashed potatoes  Whole wheat pasta is still full of digestible carbs and  Not as low in glycemic index as Dreamfield's.   Brown rice is still rice,  So skip the rice and noodles if you eat [place] or [place] (or at least limit to 1/2 cup)  9 PM snack :   Breyer's "low carb" fudgsicle or  ice cream bar (Carb Smart line), or  Weight Watcher's ice cream bar , or another "no sugar added" ice cream;  a serving of fresh berries/cherries with whipped cream   Cheese or DANNON'S LlGHT N FIT GREEK YOGURT  8 ounces of [place] unsweetened almond/cococunut milk    Treat yourself to a parfait made with whipped  cream blueberiies, walnuts and vanilla greek  yogurt  Avoid bananas, pineapple, grapes  and watermelon on a regular basis because they are high in sugar.  THINK OF THEM AS DESSERT  Remember that snack Substitutions should be less than 10 NET carbs per serving and meals < 20 carbs. Remember to subtract fiber grams to get the "net carbs."  Health Maintenance, Female Adopting a healthy lifestyle and getting preventive care are important in promoting health and wellness. Ask your health care provider about: The right schedule for you to have regular tests and exams. Things you can do on your own to prevent diseases and keep yourself healthy. What should I know about diet, weight, and exercise? Eat a healthy diet  Eat a diet that includes plenty of vegetables, fruits, low-fat dairy products, and lean protein. Do not eat a lot of foods that are high in solid fats, added sugars, or sodium. Maintain a healthy weight Body mass index (BMI) is used to identify weight problems. It estimates body fat based on height and weight. Your health care provider can help determine your BMI and help you achieve or maintain a healthy weight. Get regular exercise Get regular exercise. This is one of the most important things you can do for your health. Most adults should: Exercise for at least 150 minutes each week. The exercise should increase your heart rate and make you sweat (moderate-intensity exercise). Do strengthening exercises at least twice a week. This is in addition to the moderate-intensity exercise. Spend less time sitting. Even light physical activity can be beneficial. Watch cholesterol and blood lipids Have your blood tested for lipids and cholesterol at 32 years of age, then have this test every 5 years. Have your cholesterol levels checked more often if: Your lipid or cholesterol levels are high. You are older than 32 years of age. You are at high risk for heart disease. What should I know about cancer screening? Depending on your health  history and family history, you may need to have cancer screening at various ages. This may include screening for: Breast cancer. Cervical cancer. Colorectal cancer. Skin cancer. Lung cancer. What should I know about heart disease, diabetes, and high blood pressure? Blood pressure and heart disease High blood pressure causes heart disease and increases the risk of stroke. This is more likely to develop in people who have high blood pressure readings or are overweight. Have your blood pressure checked: Every 3-5 years if you are 24-55 years of age. Every year if you are 25 years old or older. Diabetes Have regular diabetes screenings. This checks your fasting blood sugar level. Have the screening done: Once every three years after age 68 if you are at a normal weight and have a low risk for diabetes. More often and at a younger age if you are overweight or have a high risk for diabetes. What should I know about preventing infection? Hepatitis B If you have a higher risk for hepatitis B, you should be screened for this virus. Talk with your health care provider to find out if you are at risk for hepatitis B infection. Hepatitis C Testing is recommended for: Everyone born from [birth year] through 1965. Anyone with known risk factors for hepatitis C. Sexually transmitted infections (STIs) Get screened for STIs, including gonorrhea and chlamydia, if: You are sexually active and are younger than 32 years of age. You are older than 32 years of age and your health care provider tells you that you are at risk  for this type of infection. Your sexual activity has changed since you were last screened, and you are at increased risk for chlamydia or gonorrhea. Ask your health care provider if you are at risk. Ask your health care provider about whether you are at high risk for HIV. Your health care provider may recommend a prescription medicine to help prevent HIV infection. If you choose to take medicine to  prevent HIV, you should first get tested for HIV. You should then be tested every 3 months for as long as you are taking the medicine. Pregnancy If you are about to stop having your period (premenopausal) and you may become pregnant, seek counseling before you get pregnant. Take 400 to 800 micrograms (mcg) of folic acid every day if you become pregnant. Ask for birth control (contraception) if you want to prevent pregnancy. Osteoporosis and menopause Osteoporosis is a disease in which the bones lose minerals and strength with aging. This can result in bone fractures. If you are 4 years old or older, or if you are at risk for osteoporosis and fractures, ask your health care provider if you should: Be screened for bone loss. Take a calcium or vitamin D supplement to lower your risk of fractures. Be given hormone replacement therapy (HRT) to treat symptoms of menopause. Follow these instructions at home: Alcohol use Do not drink alcohol if: Your health care provider tells you not to drink. You are pregnant, may be pregnant, or are planning to become pregnant. If you drink alcohol: Limit how much you have to: 0-1 drink a day. Know how much alcohol is in your drink. In the [place] one drink equals one 12 oz bottle of beer (355 mL), one 5 oz glass of wine (148 mL), or one 1 oz glass of hard liquor (44 mL). Lifestyle Do not use any products that contain nicotine or tobacco. These products include cigarettes, chewing tobacco, and vaping devices, such as e-cigarettes. If you need help quitting, ask your health care provider. Do not use street drugs. Do not share needles. Ask your health care provider for help if you need support or information about quitting drugs. General instructions Schedule regular health, dental, and eye exams. Stay current with your vaccines. Tell your health care provider if: You often feel depressed. You have ever been abused or do not feel safe at  home. Summary Adopting a healthy lifestyle and getting preventive care are important in promoting health and wellness. Follow your health care provider's instructions about healthy diet, exercising, and getting tested or screened for diseases. Follow your health care provider's instructions on monitoring your cholesterol and blood pressure. This information is not intended to replace advice given to you by your health care provider. Make sure you discuss any questions you have with your health care provider. Document Revised: 06/11/2020 Document Reviewed: 06/11/2020 Elsevier Patient Education  [place].

## 2022-05-06 NOTE — Progress Notes (Signed)
Assessment & Plan:  Annual physical exam Assessment & Plan: Clinical breast exam performed today.  Encouraged self breast exam at home.  Pap smear obtained.  Encouraged low glycemic diet.   Orders: -     Cytology - PAP  Encounter for lipid screening for cardiovascular disease -     Lipid panel  Weight gain Assessment & Plan: Pending insulin, A1c.  She reports history of PCOS.  We discussed low glycemic diet.  Will start and titrate metformin to aid in weight loss.   Orders: -     Insulin, random -     Hemoglobin A1c -     metFORMIN HCl ER; Take 2 tablets (1,000 mg total) by mouth 2 (two) times daily.  Dispense: 360 tablet; Refill: 3  History of vitamin D deficiency -     VITAMIN D 25 Hydroxy (Vit-D Deficiency, Fractures)  Screening for cervical cancer -     Cytology - PAP -     Cytology - PAP  Dysmenorrhea Assessment & Plan: She is outside of the typical time between menses ( 21 to 45 days).  She is euthyroid.  She reports H/o PCOS .  No evidence of PCOS by ultrasound in 2019.  No dysmenorrhea.  In the absence of pelvic pain patient I agree to monitor.  She will keep a journal.  We discussed our hesitancy in regards to starting OCP due to family history.  She is also not particularly bothered by her menses at this time.  she politely declines transvaginal ultrasound at this time to establish diagnosis of PCOS.  Orders: -     CBC with Differential/Platelet  Other orders -     LDL cholesterol, direct     Return precautions given.   Risks, benefits, and alternatives of the medications and treatment plan prescribed today were discussed, and patient expressed understanding.   Education regarding symptom management and diagnosis given to patient on AVS either electronically or printed.  No follow-ups on file.  Rennie PlowmanMargaret Braylon Grenda, FNP  Subjective:    Patient ID: Valerie RiegerAmber Nicole Gardner, female    DOB: 1990/12/09, 32 y.o.   MRN: [id number]  CC: Valerie Gardner is a 32  y.o. female who presents today for physical exam.    HPI: She is frustrated by weight gain since having her 3 children.  Prior to having her children her weight was between 115-120 pounds.  No regular exercise aside from taking care of her children.  Endorses dietary indiscretion with sweets. History of PCOS and insulin resistance  LMP: 05/03/22 Prior menses 03/08/22, menses are generally 5-6 days in length No large clots,pelvic pain.       No early family history of first-degree relatives of breast or colon cancer Cervical Cancer Screening: Due 09/09/19         Tetanus - UTD         Labs: Screening labs today. Exercise: No formal  exercise.   Alcohol use:  none Smoking/tobacco use: former smoker.    Health Maintenance  Topic Date Due   COVID-19 Vaccine (1) Never done   INFLUENZA VACCINE  09/04/2022   PAP SMEAR-Modifier  05/05/2025   DTaP/Tdap/Td (2 - Td or Tdap) 03/20/2027   Hepatitis C Screening  Completed   HIV Screening  Completed   HPV VACCINES  Aged Out    ALLERGIES: Patient has no known allergies.  Current Outpatient Medications on File Prior to Visit  Medication Sig Dispense Refill   butalbital-acetaminophen-caffeine (FIORICET) 50-325-40 MG tablet  Take 1 tablet by mouth 2 (two) times daily as needed for headache. 30 tablet 0   cyclobenzaprine (FLEXERIL) 10 MG tablet Take 1 tablet (10 mg total) by mouth daily as needed for muscle spasms. 30 tablet 2   hydrOXYzine (ATARAX/VISTARIL) 25 MG tablet TAKE 1-2 TABLETS (25-50 MG TOTAL) BY MOUTH 3 (THREE) TIMES DAILY. 540 tablet 0   Magnesium Citrate 200 MG TABS Take 400 mg by mouth daily. 120 tablet 1   metoprolol succinate (TOPROL-XL) 25 MG 24 hr tablet Take 0.5 tablets (12.5 mg total) by mouth 2 (two) times daily. 90 tablet 2   ondansetron (ZOFRAN) 4 MG tablet Take 1 tablet (4 mg total) by mouth every 8 (eight) hours as needed for nausea or vomiting. 20 tablet 0   No current facility-administered medications on file prior to  visit.    Review of Systems  Constitutional:  Negative for chills, fever and unexpected weight change.  HENT:  Negative for congestion.   Respiratory:  Negative for cough.   Cardiovascular:  Negative for chest pain, palpitations and leg swelling.  Gastrointestinal:  Negative for nausea and vomiting.  Genitourinary:  Positive for menstrual problem and vaginal bleeding. Negative for hematuria, pelvic pain and vaginal discharge.  Musculoskeletal:  Negative for arthralgias and myalgias.  Skin:  Negative for rash.  Neurological:  Negative for headaches.  Hematological:  Negative for adenopathy.  Psychiatric/Behavioral:  Negative for confusion.       Objective:    BP 118/70   Pulse 81   Temp 97.7 F (36.5 C) (Oral)   Ht 5\' 8"  (1.727 m)   Wt 172 lb 6.4 oz (78.2 kg)   LMP 05/06/2022 (Exact Date)   SpO2 97%   BMI 26.21 kg/m   BP Readings from Last 3 Encounters:  05/06/22 118/70  04/30/22 118/78  10/30/21 120/60   Wt Readings from Last 3 Encounters:  05/06/22 172 lb 6.4 oz (78.2 kg)  04/30/22 170 lb 12.8 oz (77.5 kg)  10/30/21 172 lb 9.6 oz (78.3 kg)    Physical Exam Vitals reviewed.  Constitutional:      Appearance: Normal appearance. She is well-developed.  Eyes:     Conjunctiva/sclera: Conjunctivae normal.  Neck:     Thyroid: No thyroid mass or thyromegaly.  Cardiovascular:     Rate and Rhythm: Normal rate and regular rhythm.     Pulses: Normal pulses.     Heart sounds: Normal heart sounds.  Pulmonary:     Effort: Pulmonary effort is normal.     Breath sounds: Normal breath sounds. No wheezing, rhonchi or rales.  Chest:  Breasts:    Breasts are symmetrical.     Right: No inverted nipple, mass, nipple discharge, skin change or tenderness.     Left: No inverted nipple, mass, nipple discharge, skin change or tenderness.  Abdominal:     General: Bowel sounds are normal. There is no distension.     Palpations: Abdomen is soft. Abdomen is not rigid. There is no fluid  wave or mass.     Tenderness: There is no abdominal tenderness. There is no guarding or rebound.  Genitourinary:    Cervix: No cervical motion tenderness, discharge or friability.     Uterus: Not enlarged, not fixed and not tender.      Adnexa:        Right: No mass, tenderness or fullness.         Left: No mass, tenderness or fullness.       Comments: Pap  performed. No CMT. Unable to appreciated ovaries. Lymphadenopathy:     Head:     Right side of head: No submental, submandibular, tonsillar, preauricular, posterior auricular or occipital adenopathy.     Left side of head: No submental, submandibular, tonsillar, preauricular, posterior auricular or occipital adenopathy.     Cervical:     Right cervical: No superficial, deep or posterior cervical adenopathy.    Left cervical: No superficial, deep or posterior cervical adenopathy.     Upper Body:     Right upper body: No pectoral adenopathy.     Left upper body: No pectoral adenopathy.  Skin:    General: Skin is warm and dry.          Comments: Dark hair noted on abdomen.   Neurological:     Mental Status: She is alert.  Psychiatric:        Speech: Speech normal.        Behavior: Behavior normal.        Thought Content: Thought content normal.

## 2022-05-07 LAB — INSULIN, RANDOM: Insulin: 18.7 u[IU]/mL — ABNORMAL HIGH

## 2022-05-07 LAB — CYTOLOGY - PAP
Adequacy: ABSENT
Comment: NEGATIVE
Diagnosis: NEGATIVE
High risk HPV: NEGATIVE

## 2022-05-08 ENCOUNTER — Encounter: Payer: Self-pay | Admitting: Family

## 2022-05-08 ENCOUNTER — Other Ambulatory Visit: Payer: Self-pay | Admitting: Family

## 2022-05-08 DIAGNOSIS — B379 Candidiasis, unspecified: Secondary | ICD-10-CM

## 2022-05-08 MED ORDER — FLUCONAZOLE 150 MG PO TABS
150.0000 mg | ORAL_TABLET | Freq: Once | ORAL | 1 refills | Status: AC
Start: 2022-05-08 — End: 2022-05-08

## 2022-05-08 NOTE — Telephone Encounter (Signed)
Sent AVS thru mychart  to pt

## 2022-06-02 ENCOUNTER — Other Ambulatory Visit: Payer: Self-pay | Admitting: Family

## 2022-09-18 ENCOUNTER — Encounter (INDEPENDENT_AMBULATORY_CARE_PROVIDER_SITE_OTHER): Payer: Self-pay

## 2022-09-19 ENCOUNTER — Encounter: Payer: Self-pay | Admitting: Family

## 2022-09-19 ENCOUNTER — Ambulatory Visit
Admission: EM | Admit: 2022-09-19 | Discharge: 2022-09-19 | Disposition: A | Discharge status: Home / Self Care | Payer: Commercial Managed Care - PPO | Attending: Urgent Care | Admitting: Urgent Care

## 2022-09-19 DIAGNOSIS — R399 Unspecified symptoms and signs involving the genitourinary system: Secondary | ICD-10-CM | POA: Insufficient documentation

## 2022-09-19 LAB — POCT URINALYSIS DIP (MANUAL ENTRY)
Glucose, UA: NEGATIVE mg/dL
Ketones, POC UA: NEGATIVE mg/dL
Leukocytes, UA: NEGATIVE
Nitrite, UA: NEGATIVE
Protein Ur, POC: 100 mg/dL — AB
Spec Grav, UA: >=1.03 — AB (ref 1.010–1.025)
Urobilinogen, UA: 0.2 E.U./dL
pH, UA: 5.5 (ref 5.0–8.0)

## 2022-09-19 MED ORDER — NITROFURANTOIN MONOHYD MACRO 100 MG PO CAPS
100.0000 mg | ORAL_CAPSULE | Freq: Two times a day (BID) | ORAL | 0 refills | Status: DC
Start: 2022-09-19 — End: 2022-11-21

## 2022-09-19 NOTE — ED Triage Notes (Signed)
Patient presents to UC for hematuria, urinary freq, and abdominal pressure x 2 days. Did not take any OTC meds.

## 2022-09-19 NOTE — ED Provider Notes (Signed)
Renaldo Fiddler    CSN: [id number] Arrival date & time: 09/19/22  1542      History   Chief Complaint Chief Complaint  Patient presents with   Abdominal Pain   Urinary Frequency   Hematuria    HPI Valerie Gardner is a 32 y.o. female.    Abdominal Pain Associated symptoms: hematuria   Urinary Frequency Associated symptoms include abdominal pain.  Hematuria Associated symptoms include abdominal pain.   Presents to urgent care for complaint of hematuria, urinary frequency, abdominal pressure x 2 days.  Denies back pain.  Denies fever.  Past Medical History:  Diagnosis Date   [redacted] weeks gestation of pregnancy 04/02/2017   Anxiety    PCOS (polycystic ovarian syndrome)    Supraventricular tachycardia     Patient Active Problem List   Diagnosis Date Noted   Irregular menses 04/30/2022   Headache 06/19/2021   Weight gain 04/23/2021   Other viral warts 04/27/2020   Urinary symptom or sign 04/14/2020   Chronic left-sided low back pain without sciatica 03/28/2020   Arthralgia 03/28/2020   Acute pain of right shoulder 03/28/2020   Neck pain, acute 03/28/2020   Raynaud's phenomenon without gangrene 03/28/2020   SVT (supraventricular tachycardia) 03/07/2020   Positive D dimer 03/07/2020   Anxiety 03/06/2020   Annual physical exam 03/06/2020   Vitamin D deficiency 03/06/2020   Postural dizziness with near syncope 03/06/2020   Family history of syncope 03/06/2020   Fatigue 03/06/2020   Family history of DVT 03/06/2020   Chest pain 02/12/2020   Diarrhea 02/12/2020   Nausea 02/12/2020   SVT (supraventricular tachycardia) 02/12/2020   Hyperandrogenemia 06/29/2015    Past Surgical History:  Procedure Laterality Date   NO PAST SURGERIES      OB History     Gravida  3   Para  3   Term  3   Preterm      AB      Living  3      SAB      IAB      Ectopic      Multiple  0   Live Births  3            Home Medications    Prior to  Admission medications   Medication Sig Start Date End Date Taking? Authorizing Provider  butalbital-acetaminophen-caffeine (FIORICET) 50-325-40 MG tablet Take 1 tablet by mouth 2 (two) times daily as needed for headache. 12/10/21   Allegra Grana, FNP  cyclobenzaprine (FLEXERIL) 10 MG tablet Take 1 tablet (10 mg total) by mouth daily as needed for muscle spasms. 06/19/21   Allegra Grana, FNP  hydrOXYzine (ATARAX/VISTARIL) 25 MG tablet TAKE 1-2 TABLETS (25-50 MG TOTAL) BY MOUTH 3 (THREE) TIMES DAILY. 12/10/20   Bosie Clos, MD  Magnesium Citrate 200 MG TABS Take 400 mg by mouth daily. 06/19/21   Allegra Grana, FNP  metFORMIN (GLUCOPHAGE-XR) 500 MG 24 hr tablet Take 2 tablets (1,000 mg total) by mouth 2 (two) times daily. 05/06/22 08/04/22  Allegra Grana, FNP  metoprolol succinate (TOPROL-XL) 25 MG 24 hr tablet TAKE 0.5 TABLETS BY MOUTH 2 TIMES DAILY. 06/02/22   Allegra Grana, FNP  ondansetron (ZOFRAN) 4 MG tablet Take 1 tablet (4 mg total) by mouth every 8 (eight) hours as needed for nausea or vomiting. 12/09/21   Allegra Grana, FNP    Family History Family History  Problem Relation Age of Onset   Hypertension Mother  Hypertension Father    Diabetes Mellitus I Sister    Clotting disorder Sister        blood clot in spine after covid   Melanoma Maternal Grandmother    Diabetes Mellitus II Paternal Grandmother    COPD Paternal Grandmother    Diabetes Paternal Grandmother    Brain cancer Paternal Grandfather     Social History Social History   Tobacco Use   Smoking status: Former   Smokeless tobacco: Never  Vaping Use   Vaping status: Never Used  Substance Use Topics   Alcohol use: No   Drug use: No     Allergies   Patient has no known allergies.   Review of Systems Review of Systems  Gastrointestinal:  Positive for abdominal pain.  Genitourinary:  Positive for frequency and hematuria.     Physical Exam Triage Vital Signs ED Triage Vitals  [09/19/22 1600]  Encounter Vitals Group     BP 111/77     Systolic BP Percentile      Diastolic BP Percentile      Pulse Rate 90     Resp 16     Temp 97.8 F (36.6 C)     Temp Source Temporal     SpO2 98 %     Weight      Height      Head Circumference      Peak Flow      Pain Score 0     Pain Loc      Pain Education      Exclude from Growth Chart    No data found.  Updated Vital Signs BP 111/77 (BP Location: Left Arm)   Pulse 90   Temp 97.8 F (36.6 C) (Temporal)   Resp 16   LMP 09/01/2022 (Approximate)   SpO2 98%   Visual Acuity Right Eye Distance:   Left Eye Distance:   Bilateral Distance:    Right Eye Near:   Left Eye Near:    Bilateral Near:     Physical Exam Vitals reviewed.  Constitutional:      Appearance: She is well-developed.  Abdominal:     Tenderness: There is abdominal tenderness in the suprapubic area. There is no right CVA tenderness or left CVA tenderness.  Skin:    General: Skin is warm and dry.  Neurological:     General: No focal deficit present.     Mental Status: She is alert and oriented to person, place, and time.  Psychiatric:        Mood and Affect: Mood normal.        Behavior: Behavior normal.      UC Treatments / Results  Labs (all labs ordered are listed, but only abnormal results are displayed) Labs Reviewed  POCT URINALYSIS DIP (MANUAL ENTRY) - Abnormal; Notable for the following components:      Result Value   Color, UA brown (*)    Clarity, UA turbid (*)    Bilirubin, UA small (*)    Spec Grav, UA >=1.030 (*)    Blood, UA large (*)    Protein Ur, POC =100 (*)    All other components within normal limits    EKG   Radiology No results found.  Procedures Procedures (including critical care time)  Medications Ordered in UC Medications - No data to display  Initial Impression / Assessment and Plan / UC Course  I have reviewed the triage vital signs and the nursing notes.  Pertinent labs &  imaging  results that were available during my care of the patient were reviewed by me and considered in my medical decision making (see chart for details).   Valerie Gardner is a 32 y.o. female presenting with UTI symptoms. Patient is afebrile without recent antipyretics, satting well on room air. Overall is well appearing, well hydrated, without respiratory distress.  There is suprapubic pain.  Negative CVA tenderness bilaterally.  Reviewed relevant chart history.   UA result is not strongly suggestive of urinary tract infection.  Urine is dark in color and turbid.  There is blood in the urine.  Negative nitrite.  Negative leukocytes.  Will treat presumptively for urinary tract infection based on her symptoms and send for confirmatory culture.  Giving Macrobid given she denies risk of pregnancy.  Counseled patient on potential for adverse effects with medications prescribed/recommended today, ER and return-to-clinic precautions discussed, patient verbalized understanding and agreement with care plan.  Final Clinical Impressions(s) / UC Diagnoses   Final diagnoses:  None   Discharge Instructions   None    ED Prescriptions   None    PDMP not reviewed this encounter.   Charma Igo, [place] 09/19/22 1611

## 2022-09-19 NOTE — Discharge Instructions (Signed)
Follow up here or with your primary care provider if your symptoms are worsening or not improving with treatment.     

## 2022-09-20 LAB — URINE CULTURE: Culture: NO GROWTH

## 2022-09-22 ENCOUNTER — Telehealth: Payer: Self-pay

## 2022-09-22 NOTE — Telephone Encounter (Signed)
Lvm in regards to [employer]. Per Rennie Plowman NP:   Call pt  I'm not in office this week  I would recommend an appt with provider in our office is she has chills on antibiotics Please sch first avail If no appts today , I would advise re evaluation at urgent care   Addition to prior phone note from today  Reviewed UC visit Blood in urine , no urine infection per culture .  She may stop macrobid however needs an appt to evaluate chills, hematuria

## 2022-09-22 NOTE — Telephone Encounter (Signed)
Lvm for pt to call back. 

## 2022-09-24 NOTE — Telephone Encounter (Signed)
Patient just called. I read the notes to her. She said she has an appointment the end of September. She will call back if she is feeling sick again

## 2022-10-31 ENCOUNTER — Ambulatory Visit: Payer: Commercial Managed Care - PPO | Admitting: Family

## 2022-11-21 ENCOUNTER — Ambulatory Visit: Payer: Commercial Managed Care - PPO | Admitting: Family

## 2022-11-21 ENCOUNTER — Encounter: Payer: Self-pay | Admitting: Family

## 2022-11-21 VITALS — BP 118/78 | HR 79 | Temp 97.6°F | Ht 66.0 in | Wt 154.0 lb

## 2022-11-21 DIAGNOSIS — R635 Abnormal weight gain: Secondary | ICD-10-CM | POA: Diagnosis not present

## 2022-11-21 DIAGNOSIS — R319 Hematuria, unspecified: Secondary | ICD-10-CM | POA: Diagnosis not present

## 2022-11-21 LAB — URINALYSIS, ROUTINE W REFLEX MICROSCOPIC
Bilirubin Urine: NEGATIVE
Hgb urine dipstick: NEGATIVE
Leukocytes,Ua: NEGATIVE
Nitrite: NEGATIVE
Specific Gravity, Urine: >=1.03 — AB (ref 1.000–1.030)
Total Protein, Urine: NEGATIVE
Urine Glucose: NEGATIVE
Urobilinogen, UA: 0.2 (ref 0.0–1.0)
pH: 6 (ref 5.0–8.0)

## 2022-11-21 NOTE — Assessment & Plan Note (Signed)
Congratulated patient on diligence and hard work.  She has lost 16 lbs. she would like to continue metformin 2000 mg daily.

## 2022-11-21 NOTE — Progress Notes (Signed)
Assessment & Plan:  Hematuria, unspecified type -     Urinalysis, Routine w reflex microscopic  Weight gain Assessment & Plan: Congratulated patient on diligence and hard work.  She has lost 16 lbs. she would like to continue metformin 2000 mg daily.      Return precautions given.   Risks, benefits, and alternatives of the medications and treatment plan prescribed today were discussed, and patient expressed understanding.   Education regarding symptom management and diagnosis given to patient on AVS either electronically or printed.  Return for Complete Physical Exam.  Rennie Plowman, FNP  Subjective:    Patient ID: Valerie Gardner, female    DOB: December 19, 1990, 32 y.o.   MRN: [id number]  CC: Valerie Gardner is a 32 y.o. female who presents today for follow up.   HPI: Feels well today.  No new complaints.  She is quite pleased with metformin 2000 mg daily.  She is eating less portion sizes.  Less cravings for food.  She is pleased with her weight loss  Seen in urgent care in August for suspected UTI.  Allergies: Patient has no known allergies. Current Outpatient Medications on File Prior to Visit  Medication Sig Dispense Refill   hydrOXYzine (ATARAX/VISTARIL) 25 MG tablet TAKE 1-2 TABLETS (25-50 MG TOTAL) BY MOUTH 3 (THREE) TIMES DAILY. 540 tablet 0   Magnesium Citrate 200 MG TABS Take 400 mg by mouth daily. 120 tablet 1   metoprolol succinate (TOPROL-XL) 25 MG 24 hr tablet TAKE 0.5 TABLETS BY MOUTH 2 TIMES DAILY. 90 tablet 2   ondansetron (ZOFRAN) 4 MG tablet Take 1 tablet (4 mg total) by mouth every 8 (eight) hours as needed for nausea or vomiting. 20 tablet 0   butalbital-acetaminophen-caffeine (FIORICET) 50-325-40 MG tablet Take 1 tablet by mouth 2 (two) times daily as needed for headache. (Patient not taking: Reported on 11/21/2022) 30 tablet 0   metFORMIN (GLUCOPHAGE-XR) 500 MG 24 hr tablet Take 2 tablets (1,000 mg total) by mouth 2 (two) times daily. 360  tablet 3   No current facility-administered medications on file prior to visit.    Review of Systems  Constitutional:  Negative for chills and fever.  Respiratory:  Negative for cough.   Cardiovascular:  Negative for chest pain and palpitations.  Gastrointestinal:  Negative for nausea and vomiting.      Objective:    BP 118/78   Pulse 79   Temp 97.6 F (36.4 C) (Oral)   Ht 5\' 6"  (1.676 m)   Wt 154 lb (69.9 kg)   LMP  (LMP Unknown)   SpO2 98%   BMI 24.86 kg/m  BP Readings from Last 3 Encounters:  11/21/22 118/78  09/19/22 111/77  05/06/22 118/70   Wt Readings from Last 3 Encounters:  11/21/22 154 lb (69.9 kg)  05/06/22 172 lb 6.4 oz (78.2 kg)  04/30/22 170 lb 12.8 oz (77.5 kg)    Physical Exam Vitals reviewed.  Constitutional:      Appearance: She is well-developed.  Eyes:     Conjunctiva/sclera: Conjunctivae normal.  Cardiovascular:     Rate and Rhythm: Normal rate and regular rhythm.     Pulses: Normal pulses.     Heart sounds: Normal heart sounds.  Pulmonary:     Effort: Pulmonary effort is normal.     Breath sounds: Normal breath sounds. No wheezing, rhonchi or rales.  Skin:    General: Skin is warm and dry.  Neurological:     Mental Status: She is  alert.  Psychiatric:        Speech: Speech normal.        Behavior: Behavior normal.        Thought Content: Thought content normal.

## 2022-12-21 ENCOUNTER — Encounter: Payer: Self-pay | Admitting: Family

## 2022-12-22 ENCOUNTER — Other Ambulatory Visit: Payer: Self-pay | Admitting: Family

## 2022-12-22 DIAGNOSIS — F419 Anxiety disorder, unspecified: Secondary | ICD-10-CM

## 2022-12-22 MED ORDER — HYDROXYZINE HCL 25 MG PO TABS: 25.0000 mg | ORAL_TABLET | Freq: Three times a day (TID) | ORAL | 0 refills | Status: DC

## 2023-01-13 ENCOUNTER — Other Ambulatory Visit: Payer: Self-pay | Admitting: Family

## 2023-01-13 DIAGNOSIS — F419 Anxiety disorder, unspecified: Secondary | ICD-10-CM

## 2023-01-29 ENCOUNTER — Other Ambulatory Visit: Payer: Self-pay | Admitting: Family

## 2023-01-29 DIAGNOSIS — F419 Anxiety disorder, unspecified: Secondary | ICD-10-CM

## 2023-03-08 ENCOUNTER — Other Ambulatory Visit: Payer: Self-pay | Admitting: Family

## 2023-04-03 ENCOUNTER — Other Ambulatory Visit: Payer: Self-pay | Admitting: Family

## 2023-04-03 DIAGNOSIS — R110 Nausea: Secondary | ICD-10-CM

## 2023-04-08 MED ORDER — ONDANSETRON HCL 4 MG PO TABS: 4.0000 mg | ORAL_TABLET | Freq: Three times a day (TID) | ORAL | 0 refills | Status: AC | PRN

## 2023-04-08 MED ORDER — BUTALBITAL-APAP-CAFFEINE 50-325-40 MG PO TABS: 1.0000 | ORAL_TABLET | Freq: Two times a day (BID) | ORAL | 0 refills | Status: AC | PRN

## 2023-05-02 ENCOUNTER — Other Ambulatory Visit: Payer: Self-pay | Admitting: Family

## 2023-05-02 DIAGNOSIS — R635 Abnormal weight gain: Secondary | ICD-10-CM

## 2023-05-05 NOTE — Telephone Encounter (Signed)
 Lvm for pt to give office a call back to schedule an appt. Okay to relay and schedule

## 2023-05-06 ENCOUNTER — Other Ambulatory Visit: Payer: Self-pay | Admitting: Family

## 2023-05-06 DIAGNOSIS — R635 Abnormal weight gain: Secondary | ICD-10-CM

## 2023-05-06 MED ORDER — METFORMIN HCL ER 500 MG PO TB24: 1000.0000 mg | ORAL_TABLET | Freq: Two times a day (BID) | ORAL | 0 refills | Status: DC

## 2023-08-01 ENCOUNTER — Other Ambulatory Visit: Payer: Self-pay | Admitting: Family

## 2023-08-01 DIAGNOSIS — F419 Anxiety disorder, unspecified: Secondary | ICD-10-CM

## 2023-08-01 DIAGNOSIS — R635 Abnormal weight gain: Secondary | ICD-10-CM

## 2023-10-30 ENCOUNTER — Other Ambulatory Visit: Payer: Self-pay | Admitting: Family

## 2023-10-30 DIAGNOSIS — R635 Abnormal weight gain: Secondary | ICD-10-CM

## 2023-10-30 DIAGNOSIS — F419 Anxiety disorder, unspecified: Secondary | ICD-10-CM

## 2023-10-30 NOTE — Telephone Encounter (Signed)
 VM left to CB to schedule appt as lov was 11-21-22 mychart sent as well

## 2023-11-14 ENCOUNTER — Other Ambulatory Visit: Payer: Self-pay | Admitting: Family

## 2023-11-24 ENCOUNTER — Ambulatory Visit (INDEPENDENT_AMBULATORY_CARE_PROVIDER_SITE_OTHER): Admitting: Family

## 2023-11-24 ENCOUNTER — Encounter: Payer: Self-pay | Admitting: Family

## 2023-11-24 VITALS — BP 120/76 | HR 98 | Temp 98.6°F | Wt 140.6 lb

## 2023-11-24 DIAGNOSIS — Z0000 Encounter for general adult medical examination without abnormal findings: Secondary | ICD-10-CM | POA: Diagnosis not present

## 2023-11-24 DIAGNOSIS — R635 Abnormal weight gain: Secondary | ICD-10-CM

## 2023-11-24 DIAGNOSIS — I471 Supraventricular tachycardia, unspecified: Secondary | ICD-10-CM | POA: Diagnosis not present

## 2023-11-24 LAB — CBC WITH DIFFERENTIAL/PLATELET
Basophils Absolute: 0 K/uL (ref 0.0–0.1)
Basophils Relative: 0.6 % (ref 0.0–3.0)
Eosinophils Absolute: 0.1 K/uL (ref 0.0–0.7)
Eosinophils Relative: 0.9 % (ref 0.0–5.0)
HCT: 38.5 % (ref 36.0–46.0)
Hemoglobin: 12.8 g/dL (ref 12.0–15.0)
Lymphocytes Relative: 27.5 % (ref 12.0–46.0)
Lymphs Abs: 1.6 K/uL (ref 0.7–4.0)
MCHC: 33.3 g/dL (ref 30.0–36.0)
MCV: 89.1 fl (ref 78.0–100.0)
Monocytes Absolute: 0.5 K/uL (ref 0.1–1.0)
Monocytes Relative: 7.9 % (ref 3.0–12.0)
Neutro Abs: 3.7 K/uL (ref 1.4–7.7)
Neutrophils Relative %: 63.1 % (ref 43.0–77.0)
Platelets: 241 K/uL (ref 150.0–400.0)
RBC: 4.32 Mil/uL (ref 3.87–5.11)
RDW: 13.1 % (ref 11.5–15.5)
WBC: 5.9 K/uL (ref 4.0–10.5)

## 2023-11-24 LAB — LIPID PANEL
Cholesterol: 160 mg/dL (ref 0–200)
HDL: 51.7 mg/dL (ref >39.00)
LDL Cholesterol: 96 mg/dL (ref 0–99)
NonHDL: 108.6
Total CHOL/HDL Ratio: 3
Triglycerides: 61 mg/dL (ref 0.0–149.0)
VLDL: 12.2 mg/dL (ref 0.0–40.0)

## 2023-11-24 LAB — COMPREHENSIVE METABOLIC PANEL WITH GFR
ALT: 9 U/L (ref 0–35)
AST: 12 U/L (ref 0–37)
Albumin: 4.8 g/dL (ref 3.5–5.2)
Alkaline Phosphatase: 48 U/L (ref 39–117)
BUN: 11 mg/dL (ref 6–23)
CO2: 28 meq/L (ref 19–32)
Calcium: 8.7 mg/dL (ref 8.4–10.5)
Chloride: 102 meq/L (ref 96–112)
Creatinine, Ser: 0.67 mg/dL (ref 0.40–1.20)
GFR: 115.1 mL/min (ref >60.00)
Glucose, Bld: 84 mg/dL (ref 70–99)
Potassium: 4.2 meq/L (ref 3.5–5.1)
Sodium: 137 meq/L (ref 135–145)
Total Bilirubin: 0.4 mg/dL (ref 0.2–1.2)
Total Protein: 7 g/dL (ref 6.0–8.3)

## 2023-11-24 LAB — VITAMIN D 25 HYDROXY (VIT D DEFICIENCY, FRACTURES): VITD: 21.85 ng/mL — ABNORMAL LOW (ref 30.00–100.00)

## 2023-11-24 LAB — TSH: TSH: 1.75 u[IU]/mL (ref 0.35–5.50)

## 2023-11-24 MED ORDER — METFORMIN HCL ER 500 MG PO TB24: 1000.0000 mg | ORAL_TABLET | Freq: Two times a day (BID) | ORAL | 3 refills | Status: DC

## 2023-11-24 NOTE — Assessment & Plan Note (Signed)
 Chronic,symptomatically stable at baseline.  Continue Toprol  12.5 mg twice daily. Counseled on staying vigilant if episodes were sustained or more frequent she would need further evaluation including zio monitor. She stated she would keep a journal of symptoms.

## 2023-11-24 NOTE — Progress Notes (Signed)
 Assessment & Plan:  Annual physical exam Assessment & Plan: CBE performed. Deferred pelvic exam in the absence of complaints.   Orders: -     VITAMIN D  25 Hydroxy (Vit-D Deficiency, Fractures) -     CBC with Differential/Platelet -     Comprehensive metabolic panel with GFR -     Lipid panel -     TSH -     metFORMIN  HCl ER; Take 2 tablets (1,000 mg total) by mouth 2 (two) times daily.  Dispense: 120 tablet; Refill: 3  Weight gain Assessment & Plan: Congratulated patient on weight loss; continue metformin  1000mg  bid for maintenance.   Orders: -     metFORMIN  HCl ER; Take 2 tablets (1,000 mg total) by mouth 2 (two) times daily.  Dispense: 120 tablet; Refill: 3  SVT (supraventricular tachycardia) Assessment & Plan: Chronic,symptomatically stable at baseline.  Continue Toprol  12.5 mg twice daily. Counseled on staying vigilant if episodes were sustained or more frequent she would need further evaluation including zio monitor. She stated she would keep a journal of symptoms.       Return precautions given.   Risks, benefits, and alternatives of the medications and treatment plan prescribed today were discussed, and patient expressed understanding.   Education regarding symptom management and diagnosis given to patient on AVS either electronically or printed.  No follow-ups on file.  Valerie Northern, FNP  Subjective:    Patient ID: Valerie Gardner, female    DOB: August 13, 1990, 33 y.o.   MRN: [id number]  CC: Valerie Gardner is a 33 y.o. female who presents today for physical exam.    HPI: Feels well today No new complaints.   She is doing well on metoprolol  12.5mg . Rare palpations with associated flushing symptom. Denies cp, sob, syncope.  She doesn't feel episodes are sustained or more frequent. No caffeine  use.   She is pleased with metformin  regarding reduction of cravings and weight loss; she is tolerating medication well     No family h/o breast or colon  cancer.   Cervical Cancer Screening: UTD 05/06/22  negative HPV, NILM; transformation zone absent 09/09/19 NILM  Hepatitis B vaccine-No h/o liver disease, IV drug use, blood transfusion, nor work around bodily fluids.  We agreed to defer screening for Hep B immunity as she thinks she had.         Tetanus - UTD       Exercise: Gets regular exercise.   Alcohol use:  none Smoking/tobacco use: former smoker.    Health Maintenance  Topic Date Due   COVID-19 Vaccine (1 - 2025-26 season) Never done   Flu Shot  05/03/2024*   DTaP/Tdap/Td vaccine (2 - Td or Tdap) 03/20/2027   Pap with HPV screening  05/06/2027   Hepatitis C Screening  Completed   HIV Screening  Completed   Pneumococcal Vaccine  Aged Out   Meningitis B Vaccine  Aged Out   Hepatitis B Vaccine  Discontinued   HPV Vaccine  Discontinued  *Topic was postponed. The date shown is not the original due date.    ALLERGIES: Patient has no known allergies.  Current Outpatient Medications on File Prior to Visit  Medication Sig Dispense Refill   butalbital -acetaminophen -caffeine  (FIORICET) 50-325-40 MG tablet Take 1 tablet by mouth 2 (two) times daily as needed for headache. 30 tablet 0   hydrOXYzine  (ATARAX ) 25 MG tablet TAKE 1-2 TABLETS (25-50 MG TOTAL) BY MOUTH 3 (THREE) TIMES DAILY. 180 tablet 0   Magnesium  Citrate 200  MG TABS Take 400 mg by mouth daily. 120 tablet 1   metoprolol  succinate (TOPROL -XL) 25 MG 24 hr tablet TAKE 1/2 TABLET TWICE A DAY BY MOUTH 90 tablet 0   ondansetron  (ZOFRAN ) 4 MG tablet Take 1 tablet (4 mg total) by mouth every 8 (eight) hours as needed for nausea or vomiting. 20 tablet 0   No current facility-administered medications on file prior to visit.    Review of Systems  Constitutional:  Negative for chills, fever and unexpected weight change.  HENT:  Negative for congestion.   Respiratory:  Negative for cough.   Cardiovascular:  Positive for palpitations (rare). Negative for chest pain and leg  swelling.  Gastrointestinal:  Negative for nausea and vomiting.  Musculoskeletal:  Negative for arthralgias and myalgias.  Skin:  Negative for rash.  Neurological:  Negative for headaches.  Hematological:  Negative for adenopathy.  Psychiatric/Behavioral:  Negative for confusion.       Objective:    BP 120/76   Pulse 98   Temp 98.6 F (37 C) (Oral)   Wt 140 lb 9.6 oz (63.8 kg)   LMP  (LMP Unknown)   SpO2 99%   BMI 22.69 kg/m   BP Readings from Last 3 Encounters:  11/24/23 120/76  11/21/22 118/78  09/19/22 111/77   Wt Readings from Last 3 Encounters:  11/24/23 140 lb 9.6 oz (63.8 kg)  11/21/22 154 lb (69.9 kg)  05/06/22 172 lb 6.4 oz (78.2 kg)    Physical Exam Vitals reviewed.  Constitutional:      Appearance: She is well-developed.  Eyes:     Conjunctiva/sclera: Conjunctivae normal.  Neck:     Thyroid : No thyroid  mass or thyromegaly.  Cardiovascular:     Rate and Rhythm: Normal rate and regular rhythm.     Pulses: Normal pulses.     Heart sounds: Normal heart sounds.  Pulmonary:     Effort: Pulmonary effort is normal.     Breath sounds: Normal breath sounds. No wheezing, rhonchi or rales.  Chest:  Breasts:    Breasts are symmetrical.     Right: No inverted nipple, mass, nipple discharge, skin change or tenderness.     Left: No inverted nipple, mass, nipple discharge, skin change or tenderness.  Lymphadenopathy:     Head:     Right side of head: No submental, submandibular, tonsillar, preauricular, posterior auricular or occipital adenopathy.     Left side of head: No submental, submandibular, tonsillar, preauricular, posterior auricular or occipital adenopathy.     Cervical: No cervical adenopathy.     Right cervical: No superficial, deep or posterior cervical adenopathy.    Left cervical: No superficial, deep or posterior cervical adenopathy.  Skin:    General: Skin is warm and dry.  Neurological:     Mental Status: She is alert.  Psychiatric:         Speech: Speech normal.        Behavior: Behavior normal.        Thought Content: Thought content normal.

## 2023-11-24 NOTE — Assessment & Plan Note (Signed)
 CBE performed. Deferred pelvic exam in the absence of complaints.

## 2023-11-24 NOTE — Patient Instructions (Signed)

## 2023-11-24 NOTE — Assessment & Plan Note (Signed)
 Congratulated patient on weight loss; continue metformin  1000mg  bid for maintenance.

## 2023-11-26 ENCOUNTER — Ambulatory Visit: Payer: Self-pay | Admitting: Family

## 2023-11-29 ENCOUNTER — Other Ambulatory Visit: Payer: Self-pay | Admitting: Family

## 2023-11-29 DIAGNOSIS — F419 Anxiety disorder, unspecified: Secondary | ICD-10-CM

## 2024-02-17 ENCOUNTER — Other Ambulatory Visit: Payer: Self-pay | Admitting: Family

## 2024-02-27 ENCOUNTER — Other Ambulatory Visit: Payer: Self-pay | Admitting: Family

## 2024-02-27 DIAGNOSIS — R635 Abnormal weight gain: Secondary | ICD-10-CM

## 2024-02-27 DIAGNOSIS — Z0000 Encounter for general adult medical examination without abnormal findings: Secondary | ICD-10-CM
